# Patient Record
Sex: Female | Born: 1945 | Race: White | Hispanic: No | Marital: Married | Smoking: Current every day smoker
Health system: Southern US, Community
[De-identification: ages and names within clinical notes are randomized; demographics above are authoritative.]

## PROBLEM LIST (undated history)

## (undated) DIAGNOSIS — F319 Bipolar disorder, unspecified: Secondary | ICD-10-CM

## (undated) DIAGNOSIS — J449 Chronic obstructive pulmonary disease, unspecified: Secondary | ICD-10-CM

## (undated) DIAGNOSIS — Z87442 Personal history of urinary calculi: Secondary | ICD-10-CM

## (undated) DIAGNOSIS — I10 Essential (primary) hypertension: Secondary | ICD-10-CM

## (undated) DIAGNOSIS — M1990 Unspecified osteoarthritis, unspecified site: Secondary | ICD-10-CM

## (undated) DIAGNOSIS — S82899A Other fracture of unspecified lower leg, initial encounter for closed fracture: Secondary | ICD-10-CM

## (undated) HISTORY — PX: BREAST LUMPECTOMY: SHX2

## (undated) HISTORY — PX: OTHER SURGICAL HISTORY: SHX169

## (undated) HISTORY — PX: RADICAL HYSTERECTOMY: SHX2283

## (undated) HISTORY — PX: CARPAL TUNNEL RELEASE: SHX101

## (undated) HISTORY — PX: CHOLECYSTECTOMY: SHX55

## (undated) HISTORY — PX: LAMINECTOMY: SHX219

---

## 2016-03-02 DIAGNOSIS — Z5900 Homelessness unspecified: Secondary | ICD-10-CM

## 2016-03-03 ENCOUNTER — Emergency Department (HOSPITAL_COMMUNITY)
Admission: EM | Admit: 2016-03-03 | Discharge: 2016-03-03 | Disposition: A | Discharge status: Home / Self Care | Payer: Commercial Managed Care - HMO | Attending: Emergency Medicine | Admitting: Emergency Medicine

## 2016-03-03 ENCOUNTER — Encounter (HOSPITAL_COMMUNITY): Payer: Self-pay | Admitting: *Deleted

## 2016-03-03 DIAGNOSIS — Y929 Unspecified place or not applicable: Secondary | ICD-10-CM | POA: Diagnosis not present

## 2016-03-03 DIAGNOSIS — T25211A Burn of second degree of right ankle, initial encounter: Secondary | ICD-10-CM | POA: Insufficient documentation

## 2016-03-03 DIAGNOSIS — T25222A Burn of second degree of left foot, initial encounter: Secondary | ICD-10-CM | POA: Insufficient documentation

## 2016-03-03 DIAGNOSIS — Y9302 Activity, running: Secondary | ICD-10-CM | POA: Insufficient documentation

## 2016-03-03 DIAGNOSIS — F17210 Nicotine dependence, cigarettes, uncomplicated: Secondary | ICD-10-CM | POA: Diagnosis not present

## 2016-03-03 DIAGNOSIS — X58XXXA Exposure to other specified factors, initial encounter: Secondary | ICD-10-CM | POA: Diagnosis not present

## 2016-03-03 DIAGNOSIS — F319 Bipolar disorder, unspecified: Secondary | ICD-10-CM | POA: Diagnosis not present

## 2016-03-03 DIAGNOSIS — S90829A Blister (nonthermal), unspecified foot, initial encounter: Secondary | ICD-10-CM

## 2016-03-03 DIAGNOSIS — Y999 Unspecified external cause status: Secondary | ICD-10-CM | POA: Diagnosis not present

## 2016-03-03 DIAGNOSIS — I10 Essential (primary) hypertension: Secondary | ICD-10-CM | POA: Diagnosis not present

## 2016-03-03 HISTORY — DX: Bipolar disorder, unspecified: F31.9

## 2016-03-03 HISTORY — DX: Essential (primary) hypertension: I10

## 2016-03-03 MED ORDER — ATORVASTATIN CALCIUM 20 MG PO TABS: 20.0000 mg | ORAL_TABLET | ORAL | Status: DC

## 2016-03-03 MED ORDER — MIRTAZAPINE 45 MG PO TABS: 45.0000 mg | ORAL_TABLET | Freq: Every day | ORAL | Status: DC

## 2016-03-03 MED ORDER — QUETIAPINE FUMARATE 200 MG PO TABS: 200.0000 mg | ORAL_TABLET | Freq: Two times a day (BID) | ORAL | Status: DC

## 2016-03-03 MED ORDER — BACITRACIN ZINC 500 UNIT/GM EX OINT
TOPICAL_OINTMENT | Freq: Two times a day (BID) | CUTANEOUS | Status: DC
  Administered 2016-03-03: 3 via TOPICAL
  Filled 2016-03-03: qty 2.7

## 2016-03-03 MED ORDER — DIAZEPAM 2 MG PO TABS: 2.0000 mg | ORAL_TABLET | Freq: Four times a day (QID) | ORAL | Status: DC | PRN

## 2016-03-03 MED ORDER — DIVALPROEX SODIUM 250 MG PO DR TAB: 250.0000 mg | DELAYED_RELEASE_TABLET | Freq: Every day | ORAL | Status: DC

## 2016-03-03 MED ORDER — GABAPENTIN 100 MG PO CAPS: 100.0000 mg | ORAL_CAPSULE | Freq: Three times a day (TID) | ORAL | Status: DC

## 2016-03-03 NOTE — Discharge Instructions (Signed)
Go to [facility], on Monday to get your prescriptions filled and get psychiatric care.  Clean the wounds on your feet with soap and water daily, and applying a light bandage, until they are healed.

## 2016-03-03 NOTE — ED Notes (Signed)
Patient's feet cleaned with Safclens, bacitracin applied.  Both feet wrapped with gauze.

## 2016-03-03 NOTE — Progress Notes (Addendum)
[facility].  [facility] consulted for medication needs.  EDCM unable to assist patient with cost of medications due to patient has insurance.  [facility] consulted EDSW for homeless resources.  EDCM went to speak to patient at bedside, however, registration at bedside.
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
## 2016-03-03 NOTE — ED Notes (Signed)
Bed: WA04 Expected date:  Expected time:  Means of arrival:  Comments: EMS- blisters to feet
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
## 2016-03-06 ENCOUNTER — Telehealth: Payer: Self-pay

## 2016-03-06 NOTE — Telephone Encounter (Signed)
Message received from Livia Snellen, RN CM requesting assistance with filling her medication and a hospital follow up appointment. At this time, there are no hospital follow up appointments available.   This CM spoke to Kerby Moors, [facility] Pharmacist. She noted that [facility] pharmacy does not have the valium ;but the other medications requested - seroquel, neurontin, lipitor and remeron could be filled once but they would have to be put on an account and the patient would be responsible for what the insurance doesn't cover.    Call placed to [facility] # [phone] and spoke to Bethann Humble who stated that the resident was not available. A message was left requesting the resident call this CM at [phone] or [phone].   Update provided to A. Christen Bame, RN CM.

## 2016-03-07 ENCOUNTER — Telehealth: Payer: Self-pay

## 2016-03-07 DIAGNOSIS — Z5900 Homelessness unspecified: Secondary | ICD-10-CM

## 2016-03-07 NOTE — Telephone Encounter (Signed)
Message received from the patient returning CM call.  Call placed to the [facility] # [phone]. Spoke to [place] who stated that the resident is not on the premises.  Message left requesting a call back to # [phone].

## 2016-03-08 ENCOUNTER — Telehealth: Payer: Self-pay

## 2016-03-08 DIAGNOSIS — Z5900 Homelessness unspecified: Secondary | ICD-10-CM

## 2016-03-08 DIAGNOSIS — T7411XA Adult physical abuse, confirmed, initial encounter: Secondary | ICD-10-CM

## 2016-03-08 DIAGNOSIS — I10 Essential (primary) hypertension: Secondary | ICD-10-CM
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
## 2016-03-08 NOTE — Telephone Encounter (Signed)
Message received and then a call received from the patient. She is still at the [facility]. She stated that she had all of her medications filled at [facility] on Monday, 11/07/15 and the cost was $61. She noted that she does not have a follow up appointment scheduled at [facility] yet. Explained to her the importance of being seen by a doctor for a hospital follow up appointment and to establish care with a PCP. Also explained that when this CM first reached out to her there were appointments available at the [facility]; but there are not any appointments available at this time. Informed her that she can call the [facility] at any time to check for cancellations. Also informed her that she can contact the [facility]) as there is a NP on site. She stated that she is aware of the [facility] but they are closed 03/07/16 adn 03/08/16.  Also informed her that she can call [employer] to inquire about a PCP in [place] as she said that she would like to stay here. She got her [employer] card when this CM was on the phone but she could not read the information/#s on the back. Instructed to ask someone to assist her with reading the information as she did not have her glasses. She can then call the customer service # and inquire about PCPs in this area.  She stated that she would follow up and was appreciative of the call.   Update provided to Livia Snellen, RN CM
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
## 2016-03-09 NOTE — Congregational Nurse Program (Signed)
Congregational Nurse Program Note  Date of Encounter: 03/02/2016  Past Medical History: Past Medical History  Diagnosis Date  . Hypertension   . Bipolar disorder [facility])     Encounter Details:     CNP Questionnaire - 03/08/16 1445    Patient Demographics   Is this a new or existing patient? New   Patient is considered a/an Not Applicable   Race Caucasian/White   Patient Assistance   Location of Patient Assistance Not Applicable   Patient's financial/insurance status Private Insurance Coverage   Uninsured Patient No   Patient referred to apply for the following financial assistance Not Applicable   Food insecurities addressed Referred to food bank or resource   Transportation assistance Yes   Type of Assistance Bus Pass Given   Assistance securing medications No   Educational health offerings [place] health;Interpersonal relationships;Spiritual care;Medications   Encounter Details   Primary purpose of visit Safety;Acute Illness/Condition Visit   Was an Emergency Department visit averted? Yes   Does patient have a medical provider? No   Patient referred to [place]   Was a mental health screening completed? (GAINS tool) No   Does patient have dental issues? No   Does patient have vision issues? No   Does your patient have an abnormal blood pressure today? No   Since previous encounter, have you referred patient for abnormal blood pressure that resulted in a new diagnosis or medication change? No   Does your patient have an abnormal blood glucose today? No   Since previous encounter, have you referred patient for abnormal blood glucose that resulted in a new diagnosis or medication change? No   Was there a life-saving intervention made? No     client reports she is bi-polar and is out of her medication.  States has not been able to sleep and very concerned about her mental status without her medications.  Referred her to [facility] for f/u and medication  management.

## 2016-03-20 DIAGNOSIS — Z5900 Homelessness unspecified: Secondary | ICD-10-CM

## 2016-03-20 DIAGNOSIS — T7411XA Adult physical abuse, confirmed, initial encounter: Secondary | ICD-10-CM

## 2016-03-20 DIAGNOSIS — I10 Essential (primary) hypertension: Secondary | ICD-10-CM
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
## 2016-03-23 DIAGNOSIS — Z5900 Homelessness unspecified: Secondary | ICD-10-CM

## 2016-03-23 DIAGNOSIS — I10 Essential (primary) hypertension: Secondary | ICD-10-CM

## 2016-03-23 DIAGNOSIS — F1990 Other psychoactive substance use, unspecified, uncomplicated: Secondary | ICD-10-CM

## 2016-03-23 DIAGNOSIS — T7411XA Adult physical abuse, confirmed, initial encounter: Secondary | ICD-10-CM

## 2016-03-23 NOTE — Congregational Nurse Program (Signed)
Client returned to [facility] to do laundry and to take a taxi to [facility] with a voucher from [employer].  Client is hesitant at times to move forward with going to the center, encouragement and support given. Client was able to receive medications prescribed by Marliss Coots, FNP.  Client went in taxi to [facility] center and CN met her there as a companion.  Client filled out entry information for [facility].  CN left client in a safe environment at [facility] with staff and filled out another taxi voucher from [place] for her to go back to [employer].

## 2016-06-20 ENCOUNTER — Encounter: Payer: Self-pay | Admitting: Pediatric Intensive Care

## 2016-07-19 NOTE — Congregational Nurse Program (Signed)
Congregational Nurse Program Note  Date of Encounter: 06/20/2016  Past Medical History: Past Medical History:  Diagnosis Date  . Bipolar disorder ([place])   . Hypertension     Encounter Details:     CNP Questionnaire - 06/20/16 0900      Patient Demographics   Is this a new or existing patient? New   Patient is considered a/an Not Applicable   Race Caucasian/White     Patient Assistance   Location of Patient Assistance [facility]   Patient's financial/insurance status Medicare;Medicaid   Uninsured Patient ([facility] Oncologist) No   Patient referred to apply for the following financial assistance Not Applicable   Food insecurities addressed Not Applicable   Transportation assistance No   Assistance securing medications No   Educational Programmer, multimedia the healthcare system;Chronic disease     Encounter Details   Primary purpose of visit Chronic Illness/Condition Visit   Was an Emergency Department visit averted? Not Applicable   Does patient have a medical provider? No   Patient referred to Establish PCP   Was a mental health screening completed? (GAINS tool) No   Does patient have vision issues? No   Does your patient have an abnormal blood pressure today? No   Since previous encounter, have you referred patient for abnormal blood pressure that resulted in a new diagnosis or medication change? No   Does your patient have an abnormal blood glucose today? No   Since previous encounter, have you referred patient for abnormal blood glucose that resulted in a new diagnosis or medication change? No   Was there a life-saving intervention made? No     New client visit- client states she has had chronic diarrhea for last two years. Has medications at present. No PCP in this area. Encouraged to establish PCP locally and CN will assist. Client states that she does not want a new doctor. Client to follow up with CN clinic while guest at shelter.

## 2016-09-04 ENCOUNTER — Encounter (HOSPITAL_COMMUNITY): Payer: Self-pay

## 2016-09-04 ENCOUNTER — Emergency Department (HOSPITAL_COMMUNITY)
Admission: EM | Admit: 2016-09-04 | Discharge: 2016-09-04 | Disposition: A | Discharge status: Home / Self Care | Payer: Commercial Managed Care - HMO | Attending: Emergency Medicine | Admitting: Emergency Medicine

## 2016-09-04 DIAGNOSIS — Z79899 Other long term (current) drug therapy: Secondary | ICD-10-CM | POA: Insufficient documentation

## 2016-09-04 DIAGNOSIS — R251 Tremor, unspecified: Secondary | ICD-10-CM | POA: Diagnosis not present

## 2016-09-04 DIAGNOSIS — F17210 Nicotine dependence, cigarettes, uncomplicated: Secondary | ICD-10-CM | POA: Diagnosis not present

## 2016-09-04 DIAGNOSIS — Z760 Encounter for issue of repeat prescription: Secondary | ICD-10-CM | POA: Insufficient documentation

## 2016-09-04 DIAGNOSIS — I10 Essential (primary) hypertension: Secondary | ICD-10-CM | POA: Insufficient documentation

## 2016-09-04 DIAGNOSIS — R197 Diarrhea, unspecified: Secondary | ICD-10-CM | POA: Diagnosis not present

## 2016-09-04 LAB — COMPREHENSIVE METABOLIC PANEL
ALBUMIN: 3.7 g/dL (ref 3.5–5.0)
ALT: 14 U/L (ref 14–54)
ANION GAP: 6 (ref 5–15)
AST: 18 U/L (ref 15–41)
Alkaline Phosphatase: 69 U/L (ref 38–126)
BUN: 19 mg/dL (ref 6–20)
CALCIUM: 9.3 mg/dL (ref 8.9–10.3)
CHLORIDE: 106 mmol/L (ref 101–111)
CO2: 27 mmol/L (ref 22–32)
Creatinine, Ser: 0.91 mg/dL (ref 0.44–1.00)
GFR calc non Af Amer: >60 mL/min (ref >60)
Glucose, Bld: 86 mg/dL (ref 65–99)
POTASSIUM: 3.6 mmol/L (ref 3.5–5.1)
SODIUM: 139 mmol/L (ref 135–145)
Total Bilirubin: 0.4 mg/dL (ref 0.3–1.2)
Total Protein: 7 g/dL (ref 6.5–8.1)

## 2016-09-04 LAB — CBC WITH DIFFERENTIAL/PLATELET
BASOS PCT: 1 %
Basophils Absolute: 0 10*3/uL (ref 0.0–0.1)
Eosinophils Absolute: 0.1 10*3/uL (ref 0.0–0.7)
Eosinophils Relative: 1 %
HEMATOCRIT: 44 % (ref 36.0–46.0)
HEMOGLOBIN: 14.3 g/dL (ref 12.0–15.0)
LYMPHS ABS: 2.8 10*3/uL (ref 0.7–4.0)
LYMPHS PCT: 35 %
MCH: 27.7 pg (ref 26.0–34.0)
MCHC: 32.5 g/dL (ref 30.0–36.0)
MCV: 85.1 fL (ref 78.0–100.0)
MONOS PCT: 6 %
Monocytes Absolute: 0.5 10*3/uL (ref 0.1–1.0)
NEUTROS ABS: 4.5 10*3/uL (ref 1.7–7.7)
NEUTROS PCT: 57 %
Platelets: 260 10*3/uL (ref 150–400)
RBC: 5.17 MIL/uL — ABNORMAL HIGH (ref 3.87–5.11)
RDW: 15.1 % (ref 11.5–15.5)
WBC: 7.9 10*3/uL (ref 4.0–10.5)

## 2016-09-04 MED ORDER — QUETIAPINE FUMARATE 200 MG PO TABS
200.0000 mg | ORAL_TABLET | Freq: Every day | ORAL | Status: DC
  Administered 2016-09-04: 200 mg via ORAL
  Filled 2016-09-04: qty 1

## 2016-09-04 MED ORDER — ATORVASTATIN CALCIUM 20 MG PO TABS: 20.0000 mg | ORAL_TABLET | ORAL | 0 refills | Status: DC

## 2016-09-04 MED ORDER — DIVALPROEX SODIUM 250 MG PO DR TAB: 250.0000 mg | DELAYED_RELEASE_TABLET | Freq: Every day | ORAL | 0 refills | Status: DC

## 2016-09-04 MED ORDER — MIRTAZAPINE 45 MG PO TABS: 45.0000 mg | ORAL_TABLET | Freq: Every day | ORAL | 0 refills | Status: DC

## 2016-09-04 MED ORDER — LISINOPRIL 10 MG PO TABS
10.0000 mg | ORAL_TABLET | Freq: Once | ORAL | Status: AC
  Administered 2016-09-04: 10 mg via ORAL
  Filled 2016-09-04: qty 1

## 2016-09-04 MED ORDER — GABAPENTIN 100 MG PO CAPS: 100.0000 mg | ORAL_CAPSULE | Freq: Three times a day (TID) | ORAL | 0 refills | Status: DC

## 2016-09-04 MED ORDER — DIAZEPAM 2 MG PO TABS: 2.0000 mg | ORAL_TABLET | Freq: Four times a day (QID) | ORAL | 0 refills | Status: DC | PRN

## 2016-09-04 MED ORDER — QUETIAPINE FUMARATE 200 MG PO TABS: 200.0000 mg | ORAL_TABLET | Freq: Two times a day (BID) | ORAL | 0 refills | Status: AC

## 2016-09-04 NOTE — ED Provider Notes (Signed)
[facility] DEPT Provider Note   CSN: [id number] Arrival date & time: 09/04/16  1232     History   Chief Complaint Chief Complaint  Patient presents with  . Medication Refill    HPI Maria [name] is a 70 y.o. female.  HPI  70 year old female with history of bipolar disorder, hypertension presenting to the requesting for medication refill. Patient reports she ran out of all for psychiatric and hypertensive medication for at least a month because she does not have a primary care provider. She is here at the urging of her husband to obtain a medication refill. She does complain of generalized body tremors, decreased appetite for the past 2 or 3 days which she attributed to lack of medication. She also complaining of having intermittent diarrhea at least 4 times a day for the past 2-3 weeks. She denies any associated fever, chills, lightheadedness, dizziness, headache, chest pain, shortness of breath, abdominal pain, vomiting, or rash. She denies any homicidal or suicidal ideation. Denies auditory or visual hallucination. Denies alcohol abuse or recreational drug use.  Past Medical History:  Diagnosis Date  . Bipolar disorder ([place])   . Hypertension     There are no active problems to display for this patient.   Past Surgical History:  Procedure Laterality Date  . bladder tack    . BREAST LUMPECTOMY    . CARPAL TUNNEL RELEASE Bilateral   . CHOLECYSTECTOMY    . LAMINECTOMY     neck  . RADICAL HYSTERECTOMY    . spinal fusion      OB History    No data available       Home Medications    Prior to Admission medications   Medication Sig Start Date End Date Taking? Authorizing Provider  atorvastatin (LIPITOR) 20 MG tablet Take 1 tablet (20 mg total) by mouth as directed. 03/03/16   Daleen Bo, MD  diazepam (VALIUM) 2 MG tablet Take 1 tablet (2 mg total) by mouth every 6 (six) hours as needed for anxiety. 03/03/16   Daleen Bo, MD  divalproex (DEPAKOTE) 250 MG DR  tablet Take 1 tablet (250 mg total) by mouth daily. 03/03/16   Daleen Bo, MD  gabapentin (NEURONTIN) 100 MG capsule Take 1 capsule (100 mg total) by mouth 3 (three) times daily. 03/03/16   Daleen Bo, MD  mirtazapine (REMERON) 45 MG tablet Take 1 tablet (45 mg total) by mouth at bedtime. 03/03/16   Daleen Bo, MD  QUEtiapine (SEROQUEL) 200 MG tablet Take 1 tablet (200 mg total) by mouth 2 (two) times daily. 03/03/16   Daleen Bo, MD    Family History No family history on file.  Social History Social History  Substance Use Topics  . Smoking status: Current Every Day Smoker    Packs/day: 0.50    Types: Cigarettes  . Smokeless tobacco: Never Used  . Alcohol use No     Allergies   Penicillins   Review of Systems Review of Systems  All other systems reviewed and are negative.    Physical Exam Updated Vital Signs BP 191/89 (BP Location: Left Arm)   Pulse 91   Temp 98.5 F (36.9 C) (Oral)   Resp 18   Ht 5\' 7"  (1.702 m)   Wt 68 kg   SpO2 99%   BMI 23.49 kg/m   Physical Exam  Constitutional: She is oriented to person, place, and time. She appears well-developed and well-nourished. No distress.  Elderly female sitting in bed in no acute discomfort, nontoxic  in appearance  HENT:  Head: Atraumatic.  Mouth/Throat: Oropharynx is clear and moist.  Eyes: Conjunctivae are normal.  Neck: Neck supple.  Cardiovascular: Normal rate, regular rhythm and intact distal pulses.   Pulmonary/Chest: Effort normal and breath sounds normal.  Abdominal: Soft. Bowel sounds are normal. She exhibits no distension. There is no tenderness.  Musculoskeletal: She exhibits no edema.  Neurological: She is alert and oriented to person, place, and time.  Intentional tremors to both hands  Skin: No rash noted.  Psychiatric: Her speech is normal and behavior is normal. Her mood appears anxious. Thought content is not paranoid. She expresses no homicidal and no suicidal ideation.  Nursing note  and vitals reviewed.    ED Treatments / Results  Labs (all labs ordered are listed, but only abnormal results are displayed) Labs Reviewed  CBC WITH DIFFERENTIAL/PLATELET - Abnormal; Notable for the following:       Result Value   RBC 5.17 (*)    All other components within normal limits  COMPREHENSIVE METABOLIC PANEL    EKG  EKG Interpretation None       Radiology No results found.  Procedures Procedures (including critical care time)  Medications Ordered in ED Medications  QUEtiapine (SEROQUEL) tablet 200 mg (200 mg Oral Given 09/04/16 1324)  lisinopril (PRINIVIL,ZESTRIL) tablet 10 mg (10 mg Oral Given 09/04/16 1319)     Initial Impression / Assessment and Plan / ED Course  I have reviewed the triage vital signs and the nursing notes.  Pertinent labs & imaging results that were available during my care of the patient were reviewed by me and considered in my medical decision making (see chart for details).  Clinical Course     BP 110/66 (BP Location: Right Arm)   Pulse 89   Temp 98.5 F (36.9 C) (Oral)   Resp 16   Ht 5\' 7"  (1.702 m)   Wt 68 kg   SpO2 99%   BMI 23.49 kg/m    Final Clinical Impressions(s) / ED Diagnoses   Final diagnoses:  Medication refill  Diarrhea, unspecified type  Occasional tremors    New Prescriptions Current Discharge Medication List     1:14 PM Patient here requesting for medication refill mostly of the psychiatric medication as well as her blood pressure medication. She has an elevated blood pressure of 191/89 this is likely a reflection of a symptomatic hypertensive urgency. She does complaining of having tremors in the hands as well as having recurrent diarrhea. I would like to check her labs to ensure no electrolyte derangement causing her tremors. Will provide a dose of her normal medication while she is in the ER.  Care discussed with Dr. Winfred Leeds  2:35 PM Labs are reassuring.  No electrolytes derangement.  I have  reached out to our case manager, Mariann Laster who will help coordinate outpt care such as scheduling PCP for f/u .    2:50 PM Case Manager have reviewed pt's chart.  She recommend prescribe 5 days worth of pt's mediations.  Pt can go to [facility] for refill.  She can also f/u at [facility] in 5 days for further care.  Pt made aware of plan.  Stable for discharge.  BP stable.     Domenic Moras, PA-C 09/04/16 1455    Orlie Dakin, MD 09/04/16 1610

## 2016-09-04 NOTE — ED Triage Notes (Signed)
Per Pt, Pt is coming from home. Pt has been off of her bipolar medication for one month. Pt reports tremors and is noted to have high blood pressure.

## 2016-09-04 NOTE — Discharge Instructions (Signed)
Please follow up with [facility] pharmacy to get your medication refill.  Follow up with [facility] walk in clinic on Thursday for further management of your health.

## 2016-09-04 NOTE — ED Notes (Signed)
Cup of coffee given to visitor with cream and sugar

## 2016-09-04 NOTE — ED Notes (Signed)
Patient undressed and in gown; warm blanket given; visitor at bedside

## 2016-09-04 NOTE — ED Provider Notes (Signed)
Patient requesting refills of all of her medications and referral to a PMD and as she is been living [facility] for the past 7 months and does not have a primary care physician. She is out of all of her medications except for Depakote. She feels calm since receiving a single dose of Seroquel while here. Patient is alert and in no distress, Glasgow Coma Score 15   Maria Dakin, MD 09/04/16 [phone]

## 2016-09-18 ENCOUNTER — Encounter (HOSPITAL_COMMUNITY): Payer: Self-pay

## 2016-09-18 ENCOUNTER — Emergency Department (HOSPITAL_COMMUNITY)
Admission: EM | Admit: 2016-09-18 | Discharge: 2016-09-18 | Disposition: A | Discharge status: Home / Self Care | Payer: Medicare HMO | Attending: Dermatology | Admitting: Dermatology

## 2016-09-18 DIAGNOSIS — Z760 Encounter for issue of repeat prescription: Secondary | ICD-10-CM | POA: Diagnosis not present

## 2016-09-18 DIAGNOSIS — F17210 Nicotine dependence, cigarettes, uncomplicated: Secondary | ICD-10-CM | POA: Diagnosis not present

## 2016-09-18 DIAGNOSIS — I10 Essential (primary) hypertension: Secondary | ICD-10-CM | POA: Diagnosis not present

## 2016-09-18 DIAGNOSIS — Z5321 Procedure and treatment not carried out due to patient leaving prior to being seen by health care provider: Secondary | ICD-10-CM | POA: Insufficient documentation

## 2016-09-18 NOTE — ED Notes (Signed)
Unable to locate pt for repeat vitals or to take to exam room

## 2016-09-18 NOTE — ED Triage Notes (Addendum)
Pt requesting a months supply of her home medications: Lipitor, seroquel, zoloft, depakote and "I cant remember all of them." Pt has bipolar disorder. She last took them 4 days ago. Denies SI/HI. pts son reports he has been giving her his prescribed medications. Pt also requesting psychiatric evaluation.

## 2016-09-20 DIAGNOSIS — F319 Bipolar disorder, unspecified: Secondary | ICD-10-CM | POA: Diagnosis not present

## 2016-09-21 DIAGNOSIS — F3132 Bipolar disorder, current episode depressed, moderate: Secondary | ICD-10-CM | POA: Diagnosis not present

## 2016-10-23 DIAGNOSIS — F3132 Bipolar disorder, current episode depressed, moderate: Secondary | ICD-10-CM | POA: Diagnosis not present

## 2016-12-25 ENCOUNTER — Telehealth: Payer: Self-pay

## 2016-12-25 DIAGNOSIS — F3132 Bipolar disorder, current episode depressed, moderate: Secondary | ICD-10-CM | POA: Diagnosis not present

## 2016-12-25 NOTE — Telephone Encounter (Signed)
I left a message asking Mr. Maria [name] or patient to call and confirm patient's PCP. [name] ([place])

## 2016-12-27 NOTE — Telephone Encounter (Signed)
I left another message with Mr. Maria [name] asking the patient to call me. [name] ([place])

## 2016-12-29 NOTE — Telephone Encounter (Signed)
3rd attempt to reach patient and confirm PCP. Maria [name] ([place])

## 2017-02-28 DIAGNOSIS — R319 Hematuria, unspecified: Secondary | ICD-10-CM | POA: Diagnosis not present

## 2017-02-28 DIAGNOSIS — Z8659 Personal history of other mental and behavioral disorders: Secondary | ICD-10-CM | POA: Diagnosis not present

## 2017-02-28 DIAGNOSIS — N390 Urinary tract infection, site not specified: Secondary | ICD-10-CM | POA: Diagnosis not present

## 2017-02-28 DIAGNOSIS — H539 Unspecified visual disturbance: Secondary | ICD-10-CM | POA: Diagnosis not present

## 2017-02-28 DIAGNOSIS — Z720 Tobacco use: Secondary | ICD-10-CM | POA: Diagnosis not present

## 2017-02-28 DIAGNOSIS — F319 Bipolar disorder, unspecified: Secondary | ICD-10-CM | POA: Diagnosis not present

## 2017-02-28 DIAGNOSIS — Z131 Encounter for screening for diabetes mellitus: Secondary | ICD-10-CM | POA: Diagnosis not present

## 2017-02-28 DIAGNOSIS — Z0000 Encounter for general adult medical examination without abnormal findings: Secondary | ICD-10-CM | POA: Diagnosis not present

## 2017-02-28 DIAGNOSIS — F419 Anxiety disorder, unspecified: Secondary | ICD-10-CM | POA: Diagnosis not present

## 2017-02-28 DIAGNOSIS — Z5181 Encounter for therapeutic drug level monitoring: Secondary | ICD-10-CM | POA: Diagnosis not present

## 2017-02-28 DIAGNOSIS — Z0110 Encounter for examination of ears and hearing without abnormal findings: Secondary | ICD-10-CM | POA: Diagnosis not present

## 2017-04-16 ENCOUNTER — Telehealth: Payer: Self-pay

## 2017-04-16 NOTE — Telephone Encounter (Signed)
NO TELEPHONE # HOMELESS.... No PCP is listed for this patient.  Tried to reach out to patient regarding her PCP.  No contacts listed.  No address listed.  No telephone # listed....[email or web address]

## 2017-05-15 DIAGNOSIS — N390 Urinary tract infection, site not specified: Secondary | ICD-10-CM | POA: Diagnosis not present

## 2017-05-15 DIAGNOSIS — F419 Anxiety disorder, unspecified: Secondary | ICD-10-CM | POA: Diagnosis not present

## 2017-05-15 DIAGNOSIS — F3132 Bipolar disorder, current episode depressed, moderate: Secondary | ICD-10-CM | POA: Diagnosis not present

## 2017-05-15 DIAGNOSIS — Z720 Tobacco use: Secondary | ICD-10-CM | POA: Diagnosis not present

## 2017-05-15 DIAGNOSIS — F200 Paranoid schizophrenia: Secondary | ICD-10-CM | POA: Diagnosis not present

## 2017-05-15 DIAGNOSIS — R319 Hematuria, unspecified: Secondary | ICD-10-CM | POA: Diagnosis not present

## 2017-05-15 DIAGNOSIS — E785 Hyperlipidemia, unspecified: Secondary | ICD-10-CM | POA: Diagnosis not present

## 2017-05-15 DIAGNOSIS — F319 Bipolar disorder, unspecified: Secondary | ICD-10-CM | POA: Diagnosis not present

## 2017-05-15 DIAGNOSIS — Z8659 Personal history of other mental and behavioral disorders: Secondary | ICD-10-CM | POA: Diagnosis not present

## 2017-08-16 DIAGNOSIS — F439 Reaction to severe stress, unspecified: Secondary | ICD-10-CM | POA: Diagnosis not present

## 2017-08-31 ENCOUNTER — Encounter: Payer: Self-pay | Admitting: Pediatric Intensive Care

## 2017-08-31 ENCOUNTER — Ambulatory Visit: Payer: Self-pay | Admitting: Family Medicine
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
## 2017-09-21 DIAGNOSIS — F3132 Bipolar disorder, current episode depressed, moderate: Secondary | ICD-10-CM | POA: Diagnosis not present

## 2017-11-09 DIAGNOSIS — D249 Benign neoplasm of unspecified breast: Secondary | ICD-10-CM | POA: Diagnosis not present

## 2017-11-09 DIAGNOSIS — M5136 Other intervertebral disc degeneration, lumbar region: Secondary | ICD-10-CM | POA: Diagnosis not present

## 2017-11-09 DIAGNOSIS — I10 Essential (primary) hypertension: Secondary | ICD-10-CM | POA: Diagnosis not present

## 2017-11-09 DIAGNOSIS — M169 Osteoarthritis of hip, unspecified: Secondary | ICD-10-CM | POA: Diagnosis not present

## 2017-11-09 DIAGNOSIS — F319 Bipolar disorder, unspecified: Secondary | ICD-10-CM | POA: Diagnosis not present

## 2017-11-09 DIAGNOSIS — M439 Deforming dorsopathy, unspecified: Secondary | ICD-10-CM | POA: Diagnosis not present

## 2017-11-09 DIAGNOSIS — F419 Anxiety disorder, unspecified: Secondary | ICD-10-CM | POA: Diagnosis not present

## 2017-11-09 DIAGNOSIS — M19049 Primary osteoarthritis, unspecified hand: Secondary | ICD-10-CM | POA: Diagnosis not present

## 2017-11-09 DIAGNOSIS — G629 Polyneuropathy, unspecified: Secondary | ICD-10-CM | POA: Diagnosis not present

## 2017-12-12 DIAGNOSIS — M5136 Other intervertebral disc degeneration, lumbar region: Secondary | ICD-10-CM | POA: Diagnosis not present

## 2017-12-12 DIAGNOSIS — M439 Deforming dorsopathy, unspecified: Secondary | ICD-10-CM | POA: Diagnosis not present

## 2017-12-12 DIAGNOSIS — D249 Benign neoplasm of unspecified breast: Secondary | ICD-10-CM | POA: Diagnosis not present

## 2017-12-12 DIAGNOSIS — M169 Osteoarthritis of hip, unspecified: Secondary | ICD-10-CM | POA: Diagnosis not present

## 2017-12-12 DIAGNOSIS — F419 Anxiety disorder, unspecified: Secondary | ICD-10-CM | POA: Diagnosis not present

## 2017-12-12 DIAGNOSIS — I10 Essential (primary) hypertension: Secondary | ICD-10-CM | POA: Diagnosis not present

## 2017-12-12 DIAGNOSIS — F319 Bipolar disorder, unspecified: Secondary | ICD-10-CM | POA: Diagnosis not present

## 2017-12-12 DIAGNOSIS — M19049 Primary osteoarthritis, unspecified hand: Secondary | ICD-10-CM | POA: Diagnosis not present

## 2017-12-12 DIAGNOSIS — G629 Polyneuropathy, unspecified: Secondary | ICD-10-CM | POA: Diagnosis not present

## 2017-12-18 ENCOUNTER — Emergency Department (HOSPITAL_COMMUNITY)
Admission: EM | Admit: 2017-12-18 | Discharge: 2017-12-18 | Disposition: A | Discharge status: Home / Self Care | Payer: Medicare HMO | Attending: Emergency Medicine | Admitting: Emergency Medicine

## 2017-12-18 ENCOUNTER — Emergency Department (HOSPITAL_COMMUNITY): Payer: Medicare HMO

## 2017-12-18 ENCOUNTER — Encounter (HOSPITAL_COMMUNITY): Payer: Self-pay

## 2017-12-18 DIAGNOSIS — T148XXA Other injury of unspecified body region, initial encounter: Secondary | ICD-10-CM | POA: Diagnosis not present

## 2017-12-18 DIAGNOSIS — S82401A Unspecified fracture of shaft of right fibula, initial encounter for closed fracture: Secondary | ICD-10-CM | POA: Insufficient documentation

## 2017-12-18 DIAGNOSIS — Z79899 Other long term (current) drug therapy: Secondary | ICD-10-CM | POA: Diagnosis not present

## 2017-12-18 DIAGNOSIS — S8261XA Displaced fracture of lateral malleolus of right fibula, initial encounter for closed fracture: Secondary | ICD-10-CM | POA: Diagnosis not present

## 2017-12-18 DIAGNOSIS — I10 Essential (primary) hypertension: Secondary | ICD-10-CM | POA: Insufficient documentation

## 2017-12-18 DIAGNOSIS — S82201A Unspecified fracture of shaft of right tibia, initial encounter for closed fracture: Secondary | ICD-10-CM

## 2017-12-18 DIAGNOSIS — Y999 Unspecified external cause status: Secondary | ICD-10-CM | POA: Diagnosis not present

## 2017-12-18 DIAGNOSIS — S299XXA Unspecified injury of thorax, initial encounter: Secondary | ICD-10-CM | POA: Diagnosis not present

## 2017-12-18 DIAGNOSIS — W1830XA Fall on same level, unspecified, initial encounter: Secondary | ICD-10-CM | POA: Insufficient documentation

## 2017-12-18 DIAGNOSIS — N179 Acute kidney failure, unspecified: Secondary | ICD-10-CM | POA: Diagnosis not present

## 2017-12-18 DIAGNOSIS — M25579 Pain in unspecified ankle and joints of unspecified foot: Secondary | ICD-10-CM | POA: Diagnosis not present

## 2017-12-18 DIAGNOSIS — M25571 Pain in right ankle and joints of right foot: Secondary | ICD-10-CM | POA: Diagnosis not present

## 2017-12-18 DIAGNOSIS — E860 Dehydration: Secondary | ICD-10-CM | POA: Diagnosis not present

## 2017-12-18 DIAGNOSIS — S99911A Unspecified injury of right ankle, initial encounter: Secondary | ICD-10-CM | POA: Diagnosis present

## 2017-12-18 DIAGNOSIS — Y939 Activity, unspecified: Secondary | ICD-10-CM | POA: Diagnosis not present

## 2017-12-18 DIAGNOSIS — Y929 Unspecified place or not applicable: Secondary | ICD-10-CM | POA: Insufficient documentation

## 2017-12-18 DIAGNOSIS — R42 Dizziness and giddiness: Secondary | ICD-10-CM | POA: Diagnosis not present

## 2017-12-18 DIAGNOSIS — M25561 Pain in right knee: Secondary | ICD-10-CM | POA: Diagnosis not present

## 2017-12-18 DIAGNOSIS — R55 Syncope and collapse: Secondary | ICD-10-CM | POA: Diagnosis not present

## 2017-12-18 DIAGNOSIS — S0990XA Unspecified injury of head, initial encounter: Secondary | ICD-10-CM | POA: Diagnosis not present

## 2017-12-18 DIAGNOSIS — S82251A Displaced comminuted fracture of shaft of right tibia, initial encounter for closed fracture: Secondary | ICD-10-CM | POA: Diagnosis not present

## 2017-12-18 DIAGNOSIS — F17210 Nicotine dependence, cigarettes, uncomplicated: Secondary | ICD-10-CM | POA: Insufficient documentation

## 2017-12-18 DIAGNOSIS — S82451A Displaced comminuted fracture of shaft of right fibula, initial encounter for closed fracture: Secondary | ICD-10-CM | POA: Diagnosis not present

## 2017-12-18 LAB — COMPREHENSIVE METABOLIC PANEL
ALBUMIN: 3.4 g/dL — AB (ref 3.5–5.0)
ALK PHOS: 135 U/L — AB (ref 38–126)
ALT: 67 U/L — AB (ref 14–54)
AST: 32 U/L (ref 15–41)
Anion gap: 10 (ref 5–15)
BUN: 31 mg/dL — ABNORMAL HIGH (ref 6–20)
CALCIUM: 8.4 mg/dL — AB (ref 8.9–10.3)
CHLORIDE: 105 mmol/L (ref 101–111)
CO2: 22 mmol/L (ref 22–32)
CREATININE: 1.75 mg/dL — AB (ref 0.44–1.00)
GFR calc Af Amer: 33 mL/min — ABNORMAL LOW (ref >60)
GFR calc non Af Amer: 28 mL/min — ABNORMAL LOW (ref >60)
GLUCOSE: 107 mg/dL — AB (ref 65–99)
Potassium: 4.3 mmol/L (ref 3.5–5.1)
SODIUM: 137 mmol/L (ref 135–145)
Total Bilirubin: 0.5 mg/dL (ref 0.3–1.2)
Total Protein: 6.5 g/dL (ref 6.5–8.1)

## 2017-12-18 LAB — I-STAT CG4 LACTIC ACID, ED: Lactic Acid, Venous: 1.51 mmol/L (ref 0.5–1.9)

## 2017-12-18 LAB — CBC
HCT: 39.2 % (ref 36.0–46.0)
HEMOGLOBIN: 12.4 g/dL (ref 12.0–15.0)
MCH: 26.6 pg (ref 26.0–34.0)
MCHC: 31.6 g/dL (ref 30.0–36.0)
MCV: 84.1 fL (ref 78.0–100.0)
PLATELETS: 264 10*3/uL (ref 150–400)
RBC: 4.66 MIL/uL (ref 3.87–5.11)
RDW: 16.1 % — ABNORMAL HIGH (ref 11.5–15.5)
WBC: 13.6 10*3/uL — AB (ref 4.0–10.5)

## 2017-12-18 LAB — CK: Total CK: 316 U/L — ABNORMAL HIGH (ref 38–234)

## 2017-12-18 LAB — I-STAT TROPONIN, ED: Troponin i, poc: 0 ng/mL (ref 0.00–0.08)

## 2017-12-18 LAB — VALPROIC ACID LEVEL: Valproic Acid Lvl: <10 ug/mL — ABNORMAL LOW (ref 50.0–100.0)

## 2017-12-18 LAB — CBG MONITORING, ED: Glucose-Capillary: 89 mg/dL (ref 65–99)

## 2017-12-18 MED ORDER — MORPHINE SULFATE (PF) 4 MG/ML IV SOLN
4.0000 mg | Freq: Once | INTRAVENOUS | Status: AC
  Administered 2017-12-18: 4 mg via INTRAVENOUS
  Filled 2017-12-18: qty 1

## 2017-12-18 MED ORDER — FENTANYL CITRATE (PF) 100 MCG/2ML IJ SOLN
50.0000 ug | Freq: Once | INTRAMUSCULAR | Status: AC
  Administered 2017-12-18: 50 ug via INTRAVENOUS
  Filled 2017-12-18: qty 2

## 2017-12-18 MED ORDER — HYDROCODONE-ACETAMINOPHEN 5-325 MG PO TABS
1.0000 | ORAL_TABLET | Freq: Once | ORAL | Status: AC
  Administered 2017-12-18: 1 via ORAL
  Filled 2017-12-18: qty 1

## 2017-12-18 MED ORDER — HYDROCODONE-ACETAMINOPHEN 5-325 MG PO TABS: 1.0000 | ORAL_TABLET | Freq: Four times a day (QID) | ORAL | 0 refills | Status: DC | PRN

## 2017-12-18 MED ORDER — IBUPROFEN 200 MG PO TABS: 800.0000 mg | ORAL_TABLET | Freq: Three times a day (TID) | ORAL | 0 refills | Status: DC

## 2017-12-18 NOTE — ED Notes (Signed)
Pt has pure wick in place to obtain urine specimen.  

## 2017-12-18 NOTE — ED Notes (Signed)
Bed: WA25 Expected date:  Expected time:  Means of arrival:  Comments: EMS  

## 2017-12-18 NOTE — Discharge Instructions (Signed)
Take one ibuprofen 3 times a day with meals.  Do not take other anti-inflammatories at the same time open (Advil, Motrin, naproxen, Aleve).  You may supplement with norco if you need further pain control. Use ice packs as frequently as possible.  Keep your leg elevated.  Call Dr. Lorin Mercy' office tomorrow to set up an appointment.  Follow up with your primary care doctor to recheck your kidney function and to discuss your blood pressure.  Make sure you are drinking lots of water. Try to decrease your caffeine intake.  Follow up with the heart doctor for further evaluation of your heart.  Return to the ER if you develop numbness, persistent dizziness, cheat pain, difficulty breathing, or any new or concerning symptoms.

## 2017-12-18 NOTE — ED Provider Notes (Addendum)
[facility] DEPT Provider Note   CSN: [id number] Arrival date & time: 12/18/17  1559     History   Chief Complaint No chief complaint on file.   HPI Maria [name] is a 72 y.o. female presenting for evaluation of dizziness, fall, and right ankle injury.  Patient states that she is getting up from the bathroom, took a few steps, and her ankle gave out on her.  She fell and reports acute onset right ankle pain.  She was on the floor for 30 minutes before somebody helped her up.  She reports feeling dizzy prior to the fall, this occurs whenever she goes from sitting to standing over the past 3 days.  No dizziness currently or while at rest.  She thinks she might of blacked out when she fell, but is unsure.  Currently reporting only pain at the ankle, radiating up to the knee.  No pain of the pelvis, abdomen, chest, or head.  She denies recent fevers, chills, chest pain, shortness of breath, cough, nausea, vomiting, abdominal pain, urinary symptoms, abnormal bowel movements.  She denies numbness or tingling of the foot.  She is not on blood thinners.  She has a history of high blood pressure, was started on lisinopril 1 month ago.  She also has a history of bipolar for which she takes multiple medications.  No changes recently.  HPI  Past Medical History:  Diagnosis Date  . Bipolar disorder ([place])   . Hypertension     There are no active problems to display for this patient.   Past Surgical History:  Procedure Laterality Date  . bladder tack    . BREAST LUMPECTOMY    . CARPAL TUNNEL RELEASE Bilateral   . CHOLECYSTECTOMY    . LAMINECTOMY     neck  . RADICAL HYSTERECTOMY    . spinal fusion       OB History   None      Home Medications    Prior to Admission medications   Medication Sig Start Date End Date Taking? Authorizing Provider  amitriptyline (ELAVIL) 25 MG tablet Take 25 mg by mouth at bedtime.   Yes [provider]  atorvastatin  (LIPITOR) 20 MG tablet Take 1 tablet (20 mg total) by mouth as directed. 09/04/16  Yes Domenic Moras, PA-C  diazepam (VALIUM) 2 MG tablet Take 1 tablet (2 mg total) by mouth every 6 (six) hours as needed for anxiety. 09/04/16  Yes Domenic Moras, PA-C  gabapentin (NEURONTIN) 100 MG capsule Take 1 capsule (100 mg total) by mouth 3 (three) times daily. 09/04/16  Yes Domenic Moras, PA-C  QUEtiapine (SEROQUEL) 200 MG tablet Take 1 tablet (200 mg total) by mouth 2 (two) times daily. 09/04/16  Yes Domenic Moras, PA-C  divalproex (DEPAKOTE) 250 MG DR tablet Take 1 tablet (250 mg total) by mouth daily. Patient not taking: Reported on 12/18/2017 09/04/16   Domenic Moras, PA-C  HYDROcodone-acetaminophen (NORCO/VICODIN) 5-325 MG tablet Take 1 tablet by mouth every 6 (six) hours as needed for severe pain. 12/18/17   Clarke Amburn, PA-C  ibuprofen (ADVIL,MOTRIN) 200 MG tablet Take 4 tablets (800 mg total) by mouth 3 (three) times daily. 12/18/17   [name] Bogden, PA-C  mirtazapine (REMERON) 45 MG tablet Take 1 tablet (45 mg total) by mouth at bedtime. Patient not taking: Reported on 12/18/2017 09/04/16   Domenic Moras, PA-C    Family History History reviewed. No pertinent family history.  Social History Social History   Tobacco Use  .  Smoking status: Current Every Day Smoker    Packs/day: 0.50    Types: Cigarettes  . Smokeless tobacco: Never Used  Substance Use Topics  . Alcohol use: No  . Drug use: No     Allergies   Penicillins   Review of Systems Review of Systems  Musculoskeletal: Positive for arthralgias and joint swelling.  Neurological: Positive for dizziness (not currently).  All other systems reviewed and are negative.    Physical Exam Updated Vital Signs BP (!) 142/75   Pulse 97   Temp 97.9 F (36.6 C) (Oral)   Resp 18   SpO2 98%   Physical Exam  Constitutional: She is oriented to person, place, and time. She appears well-developed and well-nourished. No distress.  HENT:  Head:  Normocephalic and atraumatic.  Mouth/Throat: Uvula is midline, oropharynx is clear and moist and mucous membranes are normal.  No obvious head injury.  No laceration or hematoma.  Mild tenderness palpation of occiput  Eyes: Pupils are equal, round, and reactive to light. Conjunctivae and EOM are normal.  Neck: Normal range of motion. Neck supple.  No tenderness to palpation of C-spine. No step offs  Cardiovascular: Normal rate, regular rhythm and intact distal pulses.  Pulmonary/Chest: Effort normal and breath sounds normal. No respiratory distress. She has no wheezes.  Abdominal: Soft. She exhibits no distension and no mass. There is no tenderness. There is no guarding.  No TTP of the abd. No rigidity, guarding or distention.  Musculoskeletal: She exhibits edema and tenderness.  Obvious swelling of the right ankle bilaterally.  Mild tenderness palpation of the right knee.  No significant swelling or injury.  Pedal pulses intact bilaterally.  Color and warmth equal bilaterally.  Sensation intact bilaterally.  Soft compartments.  No active range of motion of the right ankle due to pain.  No obvious injury or deformity noted elsewhere.  Grip strength of upper extremities intact bilaterally.  Radial pulses equal bilaterally.  Neurological: She is alert and oriented to person, place, and time.  Skin: Skin is warm and dry.  Superficial skin tear of the left forearm without active bleeding.  No obvious injury elsewhere.  Psychiatric: She has a normal mood and affect.  Nursing note and vitals reviewed.    ED Treatments / Results  Labs (all labs ordered are listed, but only abnormal results are displayed) Labs Reviewed  CBC - Abnormal; Notable for the following components:      Result Value   WBC 13.6 (*)    RDW 16.1 (*)    All other components within normal limits  COMPREHENSIVE METABOLIC PANEL - Abnormal; Notable for the following components:   Glucose, Bld 107 (*)    BUN 31 (*)     Creatinine, Ser 1.75 (*)    Calcium 8.4 (*)    Albumin 3.4 (*)    ALT 67 (*)    Alkaline Phosphatase 135 (*)    GFR calc non Af Amer 28 (*)    GFR calc Af Amer 33 (*)    All other components within normal limits  CK - Abnormal; Notable for the following components:   Total CK 316 (*)    All other components within normal limits  VALPROIC ACID LEVEL - Abnormal; Notable for the following components:   Valproic Acid Lvl <10 (*)    All other components within normal limits  URINALYSIS, ROUTINE W REFLEX MICROSCOPIC  CBG MONITORING, ED  I-STAT CG4 LACTIC ACID, ED  I-STAT TROPONIN, ED  EKG EKG Interpretation   ED ECG REPORT   Date: 12/19/2017  16:50  Rate: 89  Rhythm: sinus rhythm  R Axis: -12  QT Interval/QTc: 422/514    Interpretation: LBBB and t wave inversions in leads 1, AVL, V5, V6, No STEMI  Old EKG Reviewed: none available   Date/Time:  Tuesday December 18 2017 17:55:01 EDT Ventricular Rate:  96 PR Interval:    QRS Duration: 156 QT Interval:  422 QTC Calculation: 534 R Axis:   -12 Text Interpretation:  sinus rhythm  Multiple ventricular premature complexes Left bundle branch block When compared to prior, no significant changes seen.  No STEMI Confirmed by Antony Blackbird [phone]) on 12/18/2017 6:01:44 PM Also confirmed by Antony Blackbird [phone]), editor Philomena Doheny [phone])  on 12/19/2017 11:42:08 AM   Radiology Dg Chest 2 View  Result Date: 12/18/2017 CLINICAL DATA:  72 year old female status post fall.  Dizziness. EXAM: CHEST - 2 VIEW COMPARISON:  None. FINDINGS: Semi upright AP and lateral views of the chest. Mild to moderate cardiomegaly. Other mediastinal contours are within normal limits. Visualized tracheal air column is within normal limits. Mild elevation of the left hemidiaphragm. No pneumothorax, pulmonary edema, pleural effusion or confluent pulmonary opacity. Lower thoracic spinal stimulator device in place. No acute osseous abnormality identified. Negative  visible bowel gas pattern. Central upper abdomen surgical clips are noted. IMPRESSION: No acute cardiopulmonary abnormality. Mild-to-moderate cardiomegaly. Electronically Signed   By: Genevie Ann M.D.   On: 12/18/2017 17:36   Dg Ankle Complete Right  Result Date: 12/18/2017 CLINICAL DATA:  Fall with right ankle pain EXAM: RIGHT ANKLE - COMPLETE 3+ VIEW COMPARISON:  None. FINDINGS: There are comminuted fractures of the distal right tibia and fibula. The lateral malleolus is displaced laterally. Moderate soft tissue swelling. IMPRESSION: Comminuted fractures of the distal right tibia and fibula. Electronically Signed   By: Ulyses Jarred M.D.   On: 12/18/2017 17:26   Ct Head Wo Contrast  Result Date: 12/18/2017 CLINICAL DATA:  72 year old female status post fall.  Dizziness. EXAM: CT HEAD WITHOUT CONTRAST TECHNIQUE: Contiguous axial images were obtained from the base of the skull through the vertex without intravenous contrast. COMPARISON:  None. FINDINGS: Brain: Cerebral volume is within normal limits for age. 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 No midline shift, ventriculomegaly, mass effect, evidence of mass lesion, intracranial hemorrhage or evidence of cortically based acute infarction. Gray-white matter differentiation is within normal limits throughout the brain. No cortical encephalomalacia identified. Vascular: Calcified atherosclerosis at the skull base. No suspicious intracranial vascular hyperdensity. Skull: No acute osseous abnormality identified. Sinuses/Orbits: Visualized paranasal sinuses and mastoids are stable and well pneumatized. Other: Postoperative changes to both globes, otherwise negative orbits soft tissues. Visualized scalp soft tissues are within normal limits. IMPRESSION: Normal for age non contrast CT appearance of the brain. Electronically Signed   By: Genevie Ann M.D.   On: 12/18/2017 17:42   Ct Ankle Right Wo Contrast  Result Date:  12/18/2017 CLINICAL DATA:  The patient suffered a fall today due to dizziness resulting in a right ankle fracture. Initial encounter. EXAM: CT OF THE RIGHT ANKLE WITHOUT CONTRAST TECHNIQUE: Multidetector CT imaging of the right ankle was performed according to the standard protocol. Multiplanar CT image reconstructions were also generated. COMPARISON:  Plain films right ankle this same day. FINDINGS: Bones/Joint/Cartilage As seen on the comparison plain films, the patient has an acute fracture of the posterior malleolus. The fracture is oblique in orientation extending from  the cortex of the posterior diaphysis approximately 3.5 cm above the plafond in an inferior and anterior orientation through the mid plafond at its lateral periphery. The fracture fragment may measures 3.5 cm craniocaudal by 3.1 cm transverse by 1.6 cm AP at its lateral margin. The fracture fragment has a triangular configuration at the plafond with the base of the triangle adjacent to the fibula. Gap in the articular surface of the plafond measures 0.7 cm at its lateral margin. The fracture fragment is impacted approximately 0.3 cm. The patient also has a fracture of the distal fibula which is oblique in orientation extending from the posterior cortex approximately 3 cm above the tip of the lateral malleolus in an anterior and inferior orientation exiting the anterior cortex approximately 2 cm above the lateral malleolus. The distal fracture fragment is posteriorly displaced 1 cm with fragment override of approximately 0.5 cm. No other fracture is identified. The syndesmosis is not widened. No osteochondral lesion of the talar dome. Ligaments Suboptimally assessed by CT. Muscles and Tendons No tendon entrapment is seen. Soft tissues There is soft tissue contusion and hematoma about the ankle. IMPRESSION: Acute posterior and lateral malleolar fractures as described above. Electronically Signed   By: Inge Rise M.D.   On: 12/18/2017 21:14    Dg Knee Complete 4 Views Right  Result Date: 12/18/2017 CLINICAL DATA:  Right knee pain since a fall approximately 2 hours ago. Initial encounter. EXAM: RIGHT KNEE - COMPLETE 4+ VIEW COMPARISON:  None. FINDINGS: No evidence of fracture, dislocation, or joint effusion. No evidence of arthropathy or other focal bone abnormality. Soft tissues are unremarkable. IMPRESSION: Negative exam. Electronically Signed   By: Inge Rise M.D.   On: 12/18/2017 17:24    Procedures Procedures (including critical care time)  Medications Ordered in ED Medications  fentaNYL (SUBLIMAZE) injection 50 mcg (50 mcg Intravenous Given 12/18/17 1652)  fentaNYL (SUBLIMAZE) injection 50 mcg (50 mcg Intravenous Given 12/18/17 1808)  morphine 4 MG/ML injection 4 mg (4 mg Intravenous Given 12/18/17 1956)  HYDROcodone-acetaminophen (NORCO/VICODIN) 5-325 MG per tablet 1 tablet (1 tablet Oral Given 12/18/17 2224)     Initial Impression / Assessment and Plan / ED Course  I have reviewed the triage vital signs and the nursing notes.  Pertinent labs & imaging results that were available during my care of the patient were reviewed by me and considered in my medical decision making (see chart for details).     Patient presenting for evaluation of right ankle pain after a syncopal episode.  Physical exam reassuring, no neurologic deficits.  No dizziness currently.  Tenderness palpation and obvious swelling of the right ankle.  Pedal pulses intact bilaterally.  Sensation intact.  Blood pressure low, in the 71I systolic.  Will start IV fluids for blood pressure, order labs, x-ray ankle, knee, and CT head.  Discussed with attending, Dr. Sherry Ruffing evaluated patient.  Blood pressure improving with fluids.  Labs show AK I and dehydration.  Otherwise reassuring.  Chest x-ray viewed and interpreted by me, negative for infection or injury.  Right ankle x-ray shows comminuted fracture of distal tib-fib.  Mortise intact.  Knee x-ray without  acute findings.  CT head negative for fracture or bleed.  EKG shows left bundle, no previous to compare.  No chest pain or shortness of breath.  Troponin negative.  Will repeat EKG in an hour to ensure no changes.  Repeat EKG shows no changes.  Doubt cardiac cause for her symptoms.  Will have patient follow-up  with cardiology due to cardiomegaly and left bundle.  Will have patient follow-up with primary care for reevaluation of kidney function and blood pressure.  Will have patient stop lisinopril at this time.  Discussed with Dr. Lorin Mercy from orthopedics, who recommends posterior short leg splint, nonweightbearing, and follow-up in the office tomorrow the following day.  Request CT for further evaluation of the ankle.  Splint placed, good cap refill and sensation after splint placement.  Patient unable to ambulate with crutches.  Offered admission, pt requests to go home with wheelchair-- states she has a friend who can help. Pt states she will be safe if she goes home in a wheelchair.  At this time, patient received discharge.  Return precautions given.  Patient states she understands and agrees plan.  Final Clinical Impressions(s) / ED Diagnoses   Final diagnoses:  Closed fracture of right tibia and fibula, initial encounter  Dehydration  AKI (acute kidney injury) [facility])    ED Discharge Orders        Ordered    HYDROcodone-acetaminophen (NORCO/VICODIN) 5-325 MG tablet  Every 6 hours PRN     12/18/17 2153    ibuprofen (ADVIL,MOTRIN) 200 MG tablet  3 times daily     12/18/17 2153       Franchot Heidelberg, PA-C 12/18/17 2240    Tegeler, Gwenyth Allegra, MD 12/19/17 Lefors, Rhiley Solem, PA-C 01/02/18 1151    Tegeler, Gwenyth Allegra, MD 01/02/18 1755

## 2017-12-18 NOTE — Progress Notes (Signed)
Consult request for a wheelchair has been received. CSW attempting to follow up at present time.  CSW unable to assist with medical equipment.  CN is placing a CM consult and will speak to CM.  Please reconsult if future social work needs arise.  CSW signing off, as social work intervention is no longer needed.  Alphonse Guild. Zyaira Vejar, LCSW, LCAS, CSI Clinical Social Worker Ph: [phone]

## 2017-12-18 NOTE — ED Notes (Signed)
Pt aware of need for urine specimen. 

## 2017-12-18 NOTE — ED Notes (Signed)
[facility] called for pt 

## 2017-12-18 NOTE — ED Notes (Signed)
Message left with Maria [name]/case manager for Maria [name], who comes in in the am to arrange for wheelchair for patient. Maria [name], the case manager tonight is unable to arrange for a wheelchair tonight and patient does not want to stay in the ED all night. Patient states to primary RN, Colletta Maryland, that she has a friend who will stay with her tonight to help her to the bathroom. Patient verbalizes understanding there is no weight bearing to the right lower extremity.

## 2017-12-18 NOTE — Care Management (Signed)
ED CM spoke with Terri CN at [facility] ED concerning DME. CM explained that she would need to contact daytime [facility] ED CM unable to assist with DME tonight.

## 2017-12-18 NOTE — ED Notes (Signed)
Voicemail left with Mariann Laster the case manager about arranging for a wheelchair for patient's home use.

## 2017-12-18 NOTE — ED Triage Notes (Signed)
Patient arrived via [facility] from home. Patient called out for a fall a couple of hours ago c/o of right ankle pain. Swelling to that ankle. Patient c/o of dizziness when standing but then goes away. No LOC. No neck or back pain. Ice on ankle now. 20 Left AC. 500 ns given. HX. Hypertension.

## 2017-12-19 ENCOUNTER — Telehealth: Payer: Self-pay | Admitting: Emergency Medicine

## 2017-12-19 DIAGNOSIS — G8911 Acute pain due to trauma: Secondary | ICD-10-CM | POA: Diagnosis not present

## 2017-12-19 DIAGNOSIS — S8990XA Unspecified injury of unspecified lower leg, initial encounter: Secondary | ICD-10-CM | POA: Diagnosis not present

## 2017-12-19 NOTE — Telephone Encounter (Signed)
CM received a return call from pt and her son asking about the wheelchair.  CM again advised for her to speak to her PCP for orders to be sent to [facility] for them to deliver the wheelchair and a 3-in-1.  Son acknowledged understanding.  No further CM needs noted.

## 2017-12-19 NOTE — Telephone Encounter (Signed)
CM consulted for a wheelchair.  No orders were placed for pt and pt has left [facility] ED.  Contacted pt and left a VM advising her to contact her PCP for DME orders.  No further CM needs noted at this time.

## 2017-12-20 ENCOUNTER — Telehealth (INDEPENDENT_AMBULATORY_CARE_PROVIDER_SITE_OTHER): Payer: Self-pay | Admitting: Orthopaedic Surgery

## 2017-12-20 NOTE — Telephone Encounter (Signed)
noted 

## 2017-12-20 NOTE — Telephone Encounter (Signed)
IC patient and discussed.  I have faxed the order for all three to [facility], and specified that she is homebound at present and will need items delivered.

## 2017-12-20 NOTE — Telephone Encounter (Signed)
Ok thanks 

## 2017-12-20 NOTE — Telephone Encounter (Signed)
Message sent in error

## 2017-12-20 NOTE — Telephone Encounter (Signed)
Patient called advised she need an order for a wheelchair, a walker and a bedside commode. The number to contact patient is [phone]

## 2017-12-20 NOTE — Telephone Encounter (Signed)
Please advise 

## 2017-12-20 NOTE — Telephone Encounter (Signed)
Pt needed shower chair as well

## 2017-12-20 NOTE — Telephone Encounter (Signed)
Patient called advised she has been sitting in her chair since she got out of the hospital 12/19/17. Patient said she can not get up to go to the bathroom and she is wetting herself and everything. Patient is asking for an order for a wheelchair, walker, bedside commode and a shower chair.  Patient asked if she can get these things ordered today. The number to contact patient is [phone]

## 2017-12-20 NOTE — Telephone Encounter (Signed)
Please order for patient. Thanks.

## 2017-12-21 NOTE — Telephone Encounter (Signed)
Let them know that the orders were sent over to [facility] yesterday, just wanting to know when it would be delivered. She's been wetting herself and cannot move out of the recliner. CB # [phone]

## 2017-12-21 NOTE — Telephone Encounter (Signed)
I spoke with [facility]. They are faxing forms that need MD signature and state they may need a note as to why patient needs the equipment. I was also informed that they do not deliver wheelchairs and that the patient would have to pick it up once authorized. They close at 5 today and will not reopen until Monday.  Awaiting forms to be faxed from [place] at [facility].

## 2017-12-21 NOTE — Telephone Encounter (Signed)
noted 

## 2017-12-21 NOTE — Telephone Encounter (Signed)
I spoke with patient, she says she can get by with the shower chair, bedside commode, and walker.  She declines wheelchair at this time.  I have s/w [facility] as well, and they do not deliver any of these items.  I have advised patient she will need to get her husband to somehow find a ride/call a cab to take him to the [facility] store on [address] to get these items. She understands.   FYI only.

## 2017-12-24 ENCOUNTER — Telehealth (INDEPENDENT_AMBULATORY_CARE_PROVIDER_SITE_OTHER): Payer: Self-pay | Admitting: Orthopaedic Surgery

## 2017-12-24 MED ORDER — TRAMADOL HCL 50 MG PO TABS: 50.0000 mg | ORAL_TABLET | Freq: Four times a day (QID) | ORAL | 0 refills | Status: DC | PRN

## 2017-12-24 NOTE — Telephone Encounter (Signed)
I spoke with patient, she is aware [facility] will pick her up tomorrow 9am.  I have called the pharmacy with her Rx tramadol.

## 2017-12-24 NOTE — Telephone Encounter (Signed)
Patient call stated needed orders or RX for [facility].portable potty and shower bench.  Please call patient to advise

## 2017-12-24 NOTE — Telephone Encounter (Signed)
Patient called and I spoke to her and she wants [place] to send the orders for her DME to [facility].  I called them at [phone], and they have everything except the shower chair.  I will fax orders to [phone].  I will call patient to advise as well.

## 2017-12-24 NOTE — Telephone Encounter (Signed)
Call in ultram # [phone] 1 po q 6 hrs prn  [facility] on [address] ( [address]) .   Come see me about getting her transported to [facility] hospital on Tuesday for admission and surgery on Wednesday.

## 2017-12-24 NOTE — Telephone Encounter (Signed)
I have called patient and LMVM, need to discuss meds to call in and also plans for her surgery/pickup for surgery. Will discuss when she calls back.

## 2017-12-24 NOTE — Telephone Encounter (Signed)
I have been working with patient to get her the DME she needs.   She says she needs something for pain.  They do not have transportation to get here to get a Rx.  Please advise.

## 2017-12-24 NOTE — Telephone Encounter (Signed)
IC and got voice mail.  LMVM to call me back.

## 2017-12-25 ENCOUNTER — Other Ambulatory Visit: Payer: Self-pay

## 2017-12-25 ENCOUNTER — Telehealth (INDEPENDENT_AMBULATORY_CARE_PROVIDER_SITE_OTHER): Payer: Self-pay

## 2017-12-25 ENCOUNTER — Encounter (HOSPITAL_COMMUNITY): Payer: Self-pay | Admitting: General Practice

## 2017-12-25 ENCOUNTER — Inpatient Hospital Stay (HOSPITAL_COMMUNITY)
Admission: AD | Admit: 2017-12-25 | Discharge: 2018-01-01 | DRG: 494 | Disposition: A | Discharge status: Skilled Nursing Facility | Payer: Medicare HMO | Source: Ambulatory Visit | Attending: Orthopaedic Surgery | Admitting: Orthopaedic Surgery

## 2017-12-25 ENCOUNTER — Inpatient Hospital Stay (INDEPENDENT_AMBULATORY_CARE_PROVIDER_SITE_OTHER): Payer: Self-pay | Admitting: Orthopaedic Surgery

## 2017-12-25 ENCOUNTER — Inpatient Hospital Stay (HOSPITAL_COMMUNITY): Payer: Medicare HMO

## 2017-12-25 DIAGNOSIS — S82899A Other fracture of unspecified lower leg, initial encounter for closed fracture: Secondary | ICD-10-CM

## 2017-12-25 DIAGNOSIS — S82891A Other fracture of right lower leg, initial encounter for closed fracture: Secondary | ICD-10-CM | POA: Diagnosis present

## 2017-12-25 DIAGNOSIS — Z981 Arthrodesis status: Secondary | ICD-10-CM | POA: Diagnosis not present

## 2017-12-25 DIAGNOSIS — I10 Essential (primary) hypertension: Secondary | ICD-10-CM | POA: Diagnosis not present

## 2017-12-25 DIAGNOSIS — Z01818 Encounter for other preprocedural examination: Secondary | ICD-10-CM

## 2017-12-25 DIAGNOSIS — R278 Other lack of coordination: Secondary | ICD-10-CM | POA: Diagnosis not present

## 2017-12-25 DIAGNOSIS — R2689 Other abnormalities of gait and mobility: Secondary | ICD-10-CM | POA: Diagnosis not present

## 2017-12-25 DIAGNOSIS — S82841G Displaced bimalleolar fracture of right lower leg, subsequent encounter for closed fracture with delayed healing: Secondary | ICD-10-CM | POA: Diagnosis not present

## 2017-12-25 DIAGNOSIS — W19XXXA Unspecified fall, initial encounter: Secondary | ICD-10-CM | POA: Diagnosis present

## 2017-12-25 DIAGNOSIS — S8261XD Displaced fracture of lateral malleolus of right fibula, subsequent encounter for closed fracture with routine healing: Secondary | ICD-10-CM | POA: Diagnosis not present

## 2017-12-25 DIAGNOSIS — R05 Cough: Secondary | ICD-10-CM | POA: Diagnosis not present

## 2017-12-25 DIAGNOSIS — F17210 Nicotine dependence, cigarettes, uncomplicated: Secondary | ICD-10-CM | POA: Diagnosis present

## 2017-12-25 DIAGNOSIS — S8002XA Contusion of left knee, initial encounter: Secondary | ICD-10-CM | POA: Diagnosis not present

## 2017-12-25 DIAGNOSIS — S8990XA Unspecified injury of unspecified lower leg, initial encounter: Secondary | ICD-10-CM | POA: Diagnosis not present

## 2017-12-25 DIAGNOSIS — F319 Bipolar disorder, unspecified: Secondary | ICD-10-CM | POA: Diagnosis not present

## 2017-12-25 DIAGNOSIS — S8261XA Displaced fracture of lateral malleolus of right fibula, initial encounter for closed fracture: Secondary | ICD-10-CM | POA: Diagnosis not present

## 2017-12-25 DIAGNOSIS — Z79899 Other long term (current) drug therapy: Secondary | ICD-10-CM

## 2017-12-25 DIAGNOSIS — T148XXA Other injury of unspecified body region, initial encounter: Secondary | ICD-10-CM

## 2017-12-25 DIAGNOSIS — S82841A Displaced bimalleolar fracture of right lower leg, initial encounter for closed fracture: Principal | ICD-10-CM | POA: Diagnosis present

## 2017-12-25 DIAGNOSIS — Z880 Allergy status to penicillin: Secondary | ICD-10-CM

## 2017-12-25 DIAGNOSIS — Z9181 History of falling: Secondary | ICD-10-CM | POA: Diagnosis not present

## 2017-12-25 DIAGNOSIS — R531 Weakness: Secondary | ICD-10-CM | POA: Diagnosis present

## 2017-12-25 DIAGNOSIS — M1990 Unspecified osteoarthritis, unspecified site: Secondary | ICD-10-CM | POA: Diagnosis present

## 2017-12-25 DIAGNOSIS — G8918 Other acute postprocedural pain: Secondary | ICD-10-CM | POA: Diagnosis not present

## 2017-12-25 DIAGNOSIS — R059 Cough, unspecified: Secondary | ICD-10-CM

## 2017-12-25 DIAGNOSIS — G8911 Acute pain due to trauma: Secondary | ICD-10-CM | POA: Diagnosis not present

## 2017-12-25 DIAGNOSIS — M6281 Muscle weakness (generalized): Secondary | ICD-10-CM | POA: Diagnosis not present

## 2017-12-25 HISTORY — DX: Other fracture of unspecified lower leg, initial encounter for closed fracture: S82.899A

## 2017-12-25 HISTORY — DX: Personal history of urinary calculi: Z87.442

## 2017-12-25 HISTORY — DX: Unspecified osteoarthritis, unspecified site: M19.90

## 2017-12-25 LAB — COMPREHENSIVE METABOLIC PANEL
ALK PHOS: 122 U/L (ref 38–126)
ALT: 21 U/L (ref 14–54)
ANION GAP: 12 (ref 5–15)
AST: 17 U/L (ref 15–41)
Albumin: 3.2 g/dL — ABNORMAL LOW (ref 3.5–5.0)
BILIRUBIN TOTAL: 0.5 mg/dL (ref 0.3–1.2)
BUN: 30 mg/dL — ABNORMAL HIGH (ref 6–20)
CALCIUM: 8.7 mg/dL — AB (ref 8.9–10.3)
CO2: 22 mmol/L (ref 22–32)
CREATININE: 0.95 mg/dL (ref 0.44–1.00)
Chloride: 105 mmol/L (ref 101–111)
GFR, EST NON AFRICAN AMERICAN: 59 mL/min — AB (ref >60)
Glucose, Bld: 86 mg/dL (ref 65–99)
Potassium: 4.1 mmol/L (ref 3.5–5.1)
SODIUM: 139 mmol/L (ref 135–145)
TOTAL PROTEIN: 6.5 g/dL (ref 6.5–8.1)

## 2017-12-25 LAB — CBC
HCT: 40.3 % (ref 36.0–46.0)
HEMOGLOBIN: 12.8 g/dL (ref 12.0–15.0)
MCH: 27.1 pg (ref 26.0–34.0)
MCHC: 31.8 g/dL (ref 30.0–36.0)
MCV: 85.4 fL (ref 78.0–100.0)
Platelets: 256 10*3/uL (ref 150–400)
RBC: 4.72 MIL/uL (ref 3.87–5.11)
RDW: 16.3 % — ABNORMAL HIGH (ref 11.5–15.5)
WBC: 8.8 10*3/uL (ref 4.0–10.5)

## 2017-12-25 LAB — URINALYSIS, COMPLETE (UACMP) WITH MICROSCOPIC
BILIRUBIN URINE: NEGATIVE
GLUCOSE, UA: NEGATIVE mg/dL
KETONES UR: NEGATIVE mg/dL
NITRITE: POSITIVE — AB
PH: 5 (ref 5.0–8.0)
Protein, ur: NEGATIVE mg/dL
SPECIFIC GRAVITY, URINE: 1.02 (ref 1.005–1.030)

## 2017-12-25 LAB — SURGICAL PCR SCREEN
MRSA, PCR: NEGATIVE
Staphylococcus aureus: NEGATIVE

## 2017-12-25 MED ORDER — DOCUSATE SODIUM 100 MG PO CAPS
100.0000 mg | ORAL_CAPSULE | Freq: Two times a day (BID) | ORAL | Status: DC
  Administered 2017-12-25 – 2017-12-31 (×7): 100 mg via ORAL
  Filled 2017-12-25 (×13): qty 1

## 2017-12-25 MED ORDER — ONDANSETRON HCL 4 MG/2ML IJ SOLN
4.0000 mg | Freq: Four times a day (QID) | INTRAMUSCULAR | Status: DC | PRN
  Filled 2017-12-25: qty 2

## 2017-12-25 MED ORDER — NICOTINE 14 MG/24HR TD PT24
14.0000 mg | MEDICATED_PATCH | Freq: Every day | TRANSDERMAL | Status: DC
  Administered 2017-12-25 – 2018-01-01 (×8): 14 mg via TRANSDERMAL
  Filled 2017-12-25 (×8): qty 1

## 2017-12-25 MED ORDER — VANCOMYCIN HCL IN DEXTROSE 1-5 GM/200ML-% IV SOLN
1000.0000 mg | INTRAVENOUS | Status: AC
  Filled 2017-12-25: qty 200

## 2017-12-25 MED ORDER — POLYETHYLENE GLYCOL 3350 17 G PO PACK
17.0000 g | PACK | Freq: Every day | ORAL | Status: DC | PRN
  Administered 2017-12-28 – 2017-12-31 (×4): 17 g via ORAL
  Filled 2017-12-25 (×4): qty 1

## 2017-12-25 MED ORDER — HYDROCODONE-ACETAMINOPHEN 5-325 MG PO TABS
1.0000 | ORAL_TABLET | Freq: Four times a day (QID) | ORAL | Status: DC | PRN
  Administered 2017-12-25 – 2017-12-29 (×9): 2 via ORAL
  Administered 2017-12-30: 1 via ORAL
  Administered 2017-12-30 – 2018-01-01 (×8): 2 via ORAL
  Filled 2017-12-25 (×12): qty 2
  Filled 2017-12-25: qty 1
  Filled 2017-12-25 (×6): qty 2

## 2017-12-25 MED ORDER — ONDANSETRON HCL 4 MG PO TABS: 4.0000 mg | ORAL_TABLET | Freq: Four times a day (QID) | ORAL | Status: DC | PRN

## 2017-12-25 NOTE — Telephone Encounter (Signed)
Ailene Ravel with [facility] [address] called stating that patient is requesting a Nicotine Patch, patient smokes half a pack a day. Patient stated she would like to have a pain medicine in between taking the Norco/Vicodin, such as Tramadol or Tylenol.  CB# is [phone].  Ailene Ravel stated that Dr. Lorin Mercy can put a note in patient's chart.  Please advise.  Thank you.

## 2017-12-25 NOTE — H&P (Addendum)
Maria [name] is an 72 y.o. female.   Chief Complaint: Right closed bimalleolar ankle fracture displaced. HPI: 72 year old female with past history of hypertension and bipolar disorder had a syncopal episode after getting up quickly got lightheaded fell and suffered the above closed ankle injury with displacement.  CT scan showed displacement of the posterior malleolus and fibular fracture with some widening of the medial clear space.  I was called when patient was in the emergency room on 12/18/2017 and plan was for office follow-up in 1 to 2 days with walker, wheelchair and bedside commode for home use.  Patient went home and is been in a recliner is not been able to get out of bed until yesterday when her husband finally got her out of bed to a chair.  He was able to give her a bath she has not been able to get out of bed for bathroom use.  No wheelchair or walker was applied.  Total of 10 phone calls have been made trying to get appropriate home health medical equipment supplied to the patient without success and patient was told to come into the emergency room for admission on Tuesday for surgical fixation of her ankle on Wednesday.  Due to her age and generalized weakness she has not been able to limit weightbearing when she transfers out of bed to the recliner. Previous chest x-ray and EKG 12/18/2017 were normal. Past Medical History:  Diagnosis Date  . Arthritis   . Bipolar disorder ([place])   . Fracture, ankle 12/25/2017   closed right ankle fracture  . History of kidney stones   . Hypertension     Past Surgical History:  Procedure Laterality Date  . bladder tack    . BREAST LUMPECTOMY    . CARPAL TUNNEL RELEASE Bilateral   . CHOLECYSTECTOMY    . LAMINECTOMY     neck  . RADICAL HYSTERECTOMY    . spinal fusion      History reviewed. No pertinent family history. Social History:  reports that she has been smoking cigarettes.  She has been smoking about 0.50 packs per day. She has never used  smokeless tobacco. She reports that she does not drink alcohol or use drugs.  Allergies:  Allergies  Allergen Reactions  . Penicillins Other (See Comments)    Has patient had a PCN reaction causing immediate rash, facial/tongue/throat swelling, SOB or lightheadedness with hypotension: Yes Has patient had a PCN reaction causing severe rash involving mucus membranes or skin necrosis: Yes Has patient had a PCN reaction that required hospitalization Yes Has patient had a PCN reaction occurring within the last 10 years: No If all of the above answers are "NO", then may proceed with Cephalosporin use.     Medications Prior to Admission  Medication Sig Dispense Refill  . amitriptyline (ELAVIL) 25 MG tablet Take 25 mg by mouth at bedtime.    . clonazePAM (KLONOPIN) 1 MG tablet Take 1 mg by mouth 2 (two) times daily as needed for anxiety.    Marland Kitchen lisinopril (PRINIVIL,ZESTRIL) 10 MG tablet Take 10 mg by mouth daily.    . QUEtiapine (SEROQUEL) 200 MG tablet Take 1 tablet (200 mg total) by mouth 2 (two) times daily. 10 tablet 0  . traMADol (ULTRAM) 50 MG tablet Take 1 tablet (50 mg total) by mouth every 6 (six) hours as needed. 30 tablet 0  . atorvastatin (LIPITOR) 20 MG tablet Take 1 tablet (20 mg total) by mouth as directed. (Patient not taking: Reported on  12/25/2017) 5 tablet 0  . divalproex (DEPAKOTE) 250 MG DR tablet Take 1 tablet (250 mg total) by mouth daily. (Patient not taking: Reported on 12/18/2017) 5 tablet 0  . gabapentin (NEURONTIN) 100 MG capsule Take 1 capsule (100 mg total) by mouth 3 (three) times daily. (Patient not taking: Reported on 12/25/2017) 15 capsule 0  . HYDROcodone-acetaminophen (NORCO/VICODIN) 5-325 MG tablet Take 1 tablet by mouth every 6 (six) hours as needed for severe pain. (Patient not taking: Reported on 12/25/2017) 8 tablet 0  . ibuprofen (ADVIL,MOTRIN) 200 MG tablet Take 4 tablets (800 mg total) by mouth 3 (three) times daily. (Patient not taking: Reported on 12/25/2017) 10  tablet 0  . mirtazapine (REMERON) 45 MG tablet Take 1 tablet (45 mg total) by mouth at bedtime. (Patient not taking: Reported on 12/18/2017) 5 tablet 0    Results for orders placed or performed during the hospital encounter of 12/25/17 (from the past 48 hour(s))  Urinalysis, Complete w Microscopic     Status: Abnormal   Collection Time: 12/25/17 10:48 AM  Result Value Ref Range   Color, Urine YELLOW YELLOW   APPearance CLOUDY (A) CLEAR   Specific Gravity, Urine 1.020 1.005 - 1.030   pH 5.0 5.0 - 8.0   Glucose, UA NEGATIVE NEGATIVE mg/dL   Hgb urine dipstick SMALL (A) NEGATIVE   Bilirubin Urine NEGATIVE NEGATIVE   Ketones, ur NEGATIVE NEGATIVE mg/dL   Protein, ur NEGATIVE NEGATIVE mg/dL   Nitrite POSITIVE (A) NEGATIVE   Leukocytes, UA MODERATE (A) NEGATIVE   RBC / HPF 0-5 0 - 5 RBC/hpf   WBC, UA 6-30 0 - 5 WBC/hpf   Bacteria, UA MANY (A) NONE SEEN   Squamous Epithelial / LPF 0-5 (A) NONE SEEN    Comment: Performed at [facility], [address], [place], [place] [address]  Surgical pcr screen     Status: None   Collection Time: 12/25/17 11:21 AM  Result Value Ref Range   MRSA, PCR NEGATIVE NEGATIVE   Staphylococcus aureus NEGATIVE NEGATIVE    Comment: (NOTE) The Xpert SA Assay (FDA approved for NASAL specimens in patients 40 years of age and older), is one component of a comprehensive surveillance program. It is not intended to diagnose infection nor to guide or monitor treatment. Performed at [facility], [place] [address], [place], [place] [address]   CBC     Status: Abnormal   Collection Time: 12/25/17 11:33 AM  Result Value Ref Range   WBC 8.8 4.0 - 10.5 K/uL   RBC 4.72 3.87 - 5.11 MIL/uL   Hemoglobin 12.8 12.0 - 15.0 g/dL   HCT 40.3 36.0 - 46.0 %   MCV 85.4 78.0 - 100.0 fL   MCH 27.1 26.0 - 34.0 pg   MCHC 31.8 30.0 - 36.0 g/dL   RDW 16.3 (H) 11.5 - 15.5 %   Platelets 256 150 - 400 K/uL    Comment: Performed at [facility], [place] [address]  [place], [place] [address]  Comprehensive metabolic panel     Status: Abnormal   Collection Time: 12/25/17 11:33 AM  Result Value Ref Range   Sodium 139 135 - 145 mmol/L   Potassium 4.1 3.5 - 5.1 mmol/L   Chloride 105 101 - 111 mmol/L   CO2 22 22 - 32 mmol/L   Glucose, Bld 86 65 - 99 mg/dL   BUN 30 (H) 6 - 20 mg/dL   Creatinine, Ser 0.95 0.44 - 1.00 mg/dL  Calcium 8.7 (L) 8.9 - 10.3 mg/dL   Total Protein 6.5 6.5 - 8.1 g/dL   Albumin 3.2 (L) 3.5 - 5.0 g/dL   AST 17 15 - 41 U/L   ALT 21 14 - 54 U/L   Alkaline Phosphatase 122 38 - 126 U/L   Total Bilirubin 0.5 0.3 - 1.2 mg/dL   GFR calc non Af Amer 59 (L) >60 mL/min   GFR calc Af Amer >60 >60 mL/min    Comment: (NOTE) The eGFR has been calculated using the CKD EPI equation. This calculation has not been validated in all clinical situations. eGFR's persistently <60 mL/min signify possible Chronic Kidney Disease.    Anion gap 12 5 - 15    Comment: Performed at [facility] [address] [place], [place] [address]   Dg Chest 2 View  Result Date: 12/25/2017 CLINICAL DATA:  Preop testing EXAM: CHEST - 2 VIEW COMPARISON:  12/18/2017 FINDINGS: The heart size and mediastinal contours are within normal limits. Both lungs are clear. The visualized skeletal structures are unremarkable. Spinal stimulator is again noted. IMPRESSION: No active cardiopulmonary disease. Electronically Signed   By: Kathreen Devoid   On: 12/25/2017 13:33    ROS 14 point review of systems positive for previous treatment for breast lumpectomy, carpal tunnel release bilaterally, cholecystectomy, laminectomy, lumbar fusion x2 and radical hysterectomy without anesthetic or bleeding problems.  She denies history of stroke cardiovascular events.  Patient does not take any blood pressure.  She does have hypertension.  Positive for bipolar disorder.  Patient lives with her husband they have no car she takes a bus when she does not walk to the grocery store occasionally rides a  bicycle for further distance.  14 point review of systems otherwise negative. Patient is 1/2 pack/day smoker.   Blood pressure 117/65, pulse 90, resp. rate 14, SpO2 94 %. Physical Exam  Constitutional: She is oriented to person, place, and time. She appears well-developed and well-nourished.  HENT:  Head: Normocephalic and atraumatic.  Eyes: Pupils are equal, round, and reactive to light. EOM are normal. Scleral icterus is present.  Neck: Normal range of motion. Neck supple. No tracheal deviation present. No thyromegaly present.  Cardiovascular: Normal rate.  Respiratory: Effort normal and breath sounds normal. No respiratory distress. She has no wheezes.  GI: Soft. She exhibits no distension. There is no tenderness.  Neurological: She is alert and oriented to person, place, and time.  Skin: Skin is warm. No erythema.  Mild right lateral ecchymosis.  No fracture blisters.  Mild swelling of the ankle on the right.  Distal pulses are intact.  Psychiatric: She has a normal mood and affect. Her behavior is normal. Thought content normal.  Orthopedic exam shows normal knee without effusion.  Short splint is removed ankle is inspected there are no fracture blisters.  Mild swelling of the ankle distal pulses are intact.  Splint is reapplied.  Assessment/Plan Displaced bimalleolar ankle fracture by CT scan with displacement of posterior malleolus involvement of the plafond.  Distal fibular shortening with some widening of the medial clear space.  Plan will be operative fixation of right ankle fracture.  She may require a syndesmotic screw.  Plan fixation of the fibula out to length and fixation of the posterior malleolus.  She expects to be in the hospital for approximately 2 days.  Possible skilled nursing facility versus home with wheelchair with removable side arms, home 3 and 1 commode and walker if she is able to  transfer safely.  If transfers or problems then she will require skilled nursing  facility for a few weeks.  Plan procedure discussed with patient risks of surgery discussed.  Questions were elicited and answered she understands and requests we proceed.  Marybelle Killings, MD 12/25/2017, 8:32 PM

## 2017-12-25 NOTE — Telephone Encounter (Signed)
Please advise 

## 2017-12-25 NOTE — Plan of Care (Signed)

## 2017-12-26 ENCOUNTER — Encounter (HOSPITAL_COMMUNITY): Payer: Self-pay | Admitting: Certified Registered Nurse Anesthetist

## 2017-12-26 ENCOUNTER — Inpatient Hospital Stay (HOSPITAL_COMMUNITY): Admission: RE | Admit: 2017-12-26 | Payer: Medicare HMO | Source: Ambulatory Visit | Admitting: Orthopaedic Surgery

## 2017-12-26 ENCOUNTER — Encounter (HOSPITAL_COMMUNITY)
Admission: AD | Disposition: A | Discharge status: Skilled Nursing Facility | Payer: Self-pay | Source: Ambulatory Visit | Attending: Orthopaedic Surgery

## 2017-12-26 ENCOUNTER — Inpatient Hospital Stay (HOSPITAL_COMMUNITY): Payer: Medicare HMO

## 2017-12-26 ENCOUNTER — Inpatient Hospital Stay (HOSPITAL_COMMUNITY): Payer: Medicare HMO | Admitting: Anesthesiology

## 2017-12-26 DIAGNOSIS — S82841A Displaced bimalleolar fracture of right lower leg, initial encounter for closed fracture: Secondary | ICD-10-CM

## 2017-12-26 HISTORY — PX: ORIF ANKLE FRACTURE: SHX5408

## 2017-12-26 SURGERY — OPEN REDUCTION INTERNAL FIXATION (ORIF) ANKLE FRACTURE
Anesthesia: General | Site: Ankle | Laterality: Right

## 2017-12-26 MED ORDER — DEXTROSE 5 % IV SOLN
INTRAVENOUS | Status: DC | PRN
  Administered 2017-12-26: 45 ug/min via INTRAVENOUS

## 2017-12-26 MED ORDER — METOCLOPRAMIDE HCL 5 MG PO TABS: 5.0000 mg | ORAL_TABLET | Freq: Three times a day (TID) | ORAL | Status: DC | PRN

## 2017-12-26 MED ORDER — ROPIVACAINE HCL 7.5 MG/ML IJ SOLN
INTRAMUSCULAR | Status: DC | PRN
  Administered 2017-12-26: 20 mL via PERINEURAL

## 2017-12-26 MED ORDER — HYDROMORPHONE HCL 2 MG/ML IJ SOLN
0.5000 mg | INTRAMUSCULAR | Status: DC | PRN
  Administered 2017-12-26 – 2017-12-29 (×10): 0.5 mg via INTRAVENOUS
  Filled 2017-12-26 (×11): qty 1

## 2017-12-26 MED ORDER — HYDROMORPHONE HCL 1 MG/ML IJ SOLN: 0.5000 mg | INTRAMUSCULAR | Status: DC | PRN

## 2017-12-26 MED ORDER — CLINDAMYCIN PHOSPHATE 900 MG/50ML IV SOLN
900.0000 mg | Freq: Once | INTRAVENOUS | Status: AC
  Administered 2017-12-26: 900 mg via INTRAVENOUS
  Filled 2017-12-26: qty 50

## 2017-12-26 MED ORDER — VANCOMYCIN HCL IN DEXTROSE 1-5 GM/200ML-% IV SOLN
1000.0000 mg | Freq: Two times a day (BID) | INTRAVENOUS | Status: AC
  Administered 2017-12-26: 1000 mg via INTRAVENOUS
  Filled 2017-12-26: qty 200

## 2017-12-26 MED ORDER — ONDANSETRON HCL 4 MG PO TABS
4.0000 mg | ORAL_TABLET | Freq: Four times a day (QID) | ORAL | Status: DC | PRN
  Administered 2017-12-31: 4 mg via ORAL
  Filled 2017-12-26: qty 1

## 2017-12-26 MED ORDER — FENTANYL CITRATE (PF) 100 MCG/2ML IJ SOLN: 25.0000 ug | INTRAMUSCULAR | Status: DC | PRN

## 2017-12-26 MED ORDER — LIDOCAINE HCL (CARDIAC) PF 100 MG/5ML IV SOSY
PREFILLED_SYRINGE | INTRAVENOUS | Status: DC | PRN
  Administered 2017-12-26: 50 mg via INTRATRACHEAL

## 2017-12-26 MED ORDER — 0.9 % SODIUM CHLORIDE (POUR BTL) OPTIME
TOPICAL | Status: DC | PRN
  Administered 2017-12-26: 1000 mL

## 2017-12-26 MED ORDER — PHENYLEPHRINE 40 MCG/ML (10ML) SYRINGE FOR IV PUSH (FOR BLOOD PRESSURE SUPPORT)
PREFILLED_SYRINGE | INTRAVENOUS | Status: DC | PRN
  Administered 2017-12-26 (×3): 80 ug via INTRAVENOUS

## 2017-12-26 MED ORDER — MIDAZOLAM HCL 2 MG/2ML IJ SOLN
1.0000 mg | Freq: Once | INTRAMUSCULAR | Status: AC
  Administered 2017-12-26: 1 mg via INTRAVENOUS

## 2017-12-26 MED ORDER — DEXAMETHASONE SODIUM PHOSPHATE 10 MG/ML IJ SOLN
INTRAMUSCULAR | Status: DC | PRN
  Administered 2017-12-26: 5 mg via INTRAVENOUS

## 2017-12-26 MED ORDER — FENTANYL CITRATE (PF) 100 MCG/2ML IJ SOLN
50.0000 ug | Freq: Once | INTRAMUSCULAR | Status: AC
  Administered 2017-12-26: 50 ug via INTRAVENOUS

## 2017-12-26 MED ORDER — MIDAZOLAM HCL 2 MG/2ML IJ SOLN
INTRAMUSCULAR | Status: AC
  Administered 2017-12-26: 1 mg via INTRAVENOUS
  Filled 2017-12-26: qty 2

## 2017-12-26 MED ORDER — METOCLOPRAMIDE HCL 5 MG/ML IJ SOLN
5.0000 mg | Freq: Three times a day (TID) | INTRAMUSCULAR | Status: DC | PRN
  Administered 2017-12-28: 10 mg via INTRAVENOUS
  Filled 2017-12-26: qty 2

## 2017-12-26 MED ORDER — FENTANYL CITRATE (PF) 100 MCG/2ML IJ SOLN
INTRAMUSCULAR | Status: AC
  Administered 2017-12-26: 50 ug via INTRAVENOUS
  Filled 2017-12-26: qty 2

## 2017-12-26 MED ORDER — ONDANSETRON HCL 4 MG/2ML IJ SOLN
4.0000 mg | Freq: Four times a day (QID) | INTRAMUSCULAR | Status: DC | PRN
  Administered 2017-12-27 – 2017-12-28 (×2): 4 mg via INTRAVENOUS
  Filled 2017-12-26 (×2): qty 2

## 2017-12-26 MED ORDER — BUPIVACAINE-EPINEPHRINE (PF) 0.5% -1:200000 IJ SOLN
INTRAMUSCULAR | Status: DC | PRN
  Administered 2017-12-26: 30 mL via PERINEURAL

## 2017-12-26 MED ORDER — DOCUSATE SODIUM 100 MG PO CAPS
100.0000 mg | ORAL_CAPSULE | Freq: Two times a day (BID) | ORAL | Status: DC
  Administered 2017-12-27 – 2018-01-01 (×11): 100 mg via ORAL
  Filled 2017-12-26 (×3): qty 1

## 2017-12-26 MED ORDER — PROPOFOL 10 MG/ML IV BOLUS
INTRAVENOUS | Status: DC | PRN
  Administered 2017-12-26: 140 mg via INTRAVENOUS

## 2017-12-26 MED ORDER — ONDANSETRON HCL 4 MG/2ML IJ SOLN
INTRAMUSCULAR | Status: DC | PRN
  Administered 2017-12-26: 4 mg via INTRAVENOUS

## 2017-12-26 MED ORDER — FENTANYL CITRATE (PF) 250 MCG/5ML IJ SOLN
INTRAMUSCULAR | Status: DC | PRN
  Administered 2017-12-26 (×2): 50 ug via INTRAVENOUS

## 2017-12-26 MED ORDER — LACTATED RINGERS IV SOLN
INTRAVENOUS | Status: DC
  Administered 2017-12-26 (×2): via INTRAVENOUS

## 2017-12-26 MED ORDER — MEPERIDINE HCL 50 MG/ML IJ SOLN: 6.2500 mg | INTRAMUSCULAR | Status: DC | PRN

## 2017-12-26 SURGICAL SUPPLY — 70 items
BANDAGE ACE 4X5 VEL STRL LF (GAUZE/BANDAGES/DRESSINGS) IMPLANT
BANDAGE ACE 6X5 VEL STRL LF (GAUZE/BANDAGES/DRESSINGS) IMPLANT
BANDAGE ELASTIC 4 VELCRO ST LF (GAUZE/BANDAGES/DRESSINGS) ×2 IMPLANT
BANDAGE ELASTIC 6 VELCRO ST LF (GAUZE/BANDAGES/DRESSINGS) ×2 IMPLANT
BANDAGE ESMARK 6X9 LF (GAUZE/BANDAGES/DRESSINGS) ×1 IMPLANT
BIT DRILL 2.9 CANN QC NONSTRL (BIT) ×2 IMPLANT
BIT DRILL CALIBRATED 2.7 (BIT) ×2 IMPLANT
BNDG ESMARK 6X9 LF (GAUZE/BANDAGES/DRESSINGS) ×2
COVER MAYO STAND STRL (DRAPES) ×2 IMPLANT
COVER SURGICAL LIGHT HANDLE (MISCELLANEOUS) ×2 IMPLANT
CUFF TOURNIQUET SINGLE 34IN LL (TOURNIQUET CUFF) ×2 IMPLANT
CUFF TOURNIQUET SINGLE 44IN (TOURNIQUET CUFF) IMPLANT
DRAPE C-ARM 42X72 X-RAY (DRAPES) ×2 IMPLANT
DRAPE HALF SHEET 40X57 (DRAPES) ×2 IMPLANT
DRAPE INCISE IOBAN 66X45 STRL (DRAPES) ×2 IMPLANT
DRAPE U-SHAPE 47X51 STRL (DRAPES) ×2 IMPLANT
DRSG PAD ABDOMINAL 8X10 ST (GAUZE/BANDAGES/DRESSINGS) ×2 IMPLANT
DURAPREP 26ML APPLICATOR (WOUND CARE) ×2 IMPLANT
ELECT CAUTERY BLADE 6.4 (BLADE) ×2 IMPLANT
ELECT REM PT RETURN 9FT ADLT (ELECTROSURGICAL) ×2
ELECTRODE REM PT RTRN 9FT ADLT (ELECTROSURGICAL) ×1 IMPLANT
GAUZE SPONGE 4X4 12PLY STRL (GAUZE/BANDAGES/DRESSINGS) ×2 IMPLANT
GAUZE XEROFORM 1X8 LF (GAUZE/BANDAGES/DRESSINGS) ×2 IMPLANT
GAUZE XEROFORM 5X9 LF (GAUZE/BANDAGES/DRESSINGS) ×2 IMPLANT
GLOVE BIOGEL PI IND STRL 6.5 (GLOVE) ×2 IMPLANT
GLOVE BIOGEL PI IND STRL 8 (GLOVE) ×2 IMPLANT
GLOVE BIOGEL PI INDICATOR 6.5 (GLOVE) ×2
GLOVE BIOGEL PI INDICATOR 8 (GLOVE) ×2
GLOVE ORTHO TXT STRL SZ7.5 (GLOVE) ×6 IMPLANT
GLOVE SURG SS PI 6.5 STRL IVOR (GLOVE) ×4 IMPLANT
GOWN STRL REUS W/ TWL LRG LVL3 (GOWN DISPOSABLE) ×3 IMPLANT
GOWN STRL REUS W/ TWL XL LVL3 (GOWN DISPOSABLE) ×1 IMPLANT
GOWN STRL REUS W/TWL 2XL LVL3 (GOWN DISPOSABLE) ×2 IMPLANT
GOWN STRL REUS W/TWL LRG LVL3 (GOWN DISPOSABLE) ×3
GOWN STRL REUS W/TWL XL LVL3 (GOWN DISPOSABLE) ×1
K-WIRE ACE 1.6X6 (WIRE) ×2
KIT BASIN OR (CUSTOM PROCEDURE TRAY) ×2 IMPLANT
KIT TURNOVER KIT B (KITS) ×2 IMPLANT
KWIRE ACE 1.6X6 (WIRE) ×1 IMPLANT
MANIFOLD NEPTUNE II (INSTRUMENTS) ×2 IMPLANT
NS IRRIG 1000ML POUR BTL (IV SOLUTION) ×2 IMPLANT
PACK ORTHO EXTREMITY (CUSTOM PROCEDURE TRAY) ×2 IMPLANT
PAD ARMBOARD 7.5X6 YLW CONV (MISCELLANEOUS) ×2 IMPLANT
PAD CAST 4YDX4 CTTN HI CHSV (CAST SUPPLIES) ×1 IMPLANT
PADDING CAST COTTON 4X4 STRL (CAST SUPPLIES) ×1
PADDING CAST COTTON 6X4 STRL (CAST SUPPLIES) ×2 IMPLANT
PLATE LOCK 8H 103 BILAT FIB (Plate) ×2 IMPLANT
SCREW ACE CAN 4.0 36M (Screw) ×2 IMPLANT
SCREW ACE CAN 4.0 40M (Screw) ×2 IMPLANT
SCREW ACE CAN 4.0 46M (Screw) ×2 IMPLANT
SCREW CORTICAL 3.5MM  28MM (Screw) ×1 IMPLANT
SCREW CORTICAL 3.5MM 28MM (Screw) ×1 IMPLANT
SCREW LOCK 3.5X10 DIST TIB (Screw) ×2 IMPLANT
SCREW LOCK CORT STAR 3.5X10 (Screw) ×4 IMPLANT
SCREW LOCK CORT STAR 3.5X12 (Screw) ×4 IMPLANT
SCREW NON LOCKING LP 3.5 14MM (Screw) ×4 IMPLANT
SPLINT FIBERGLASS 4X30 (CAST SUPPLIES) ×4 IMPLANT
SPONGE LAP 18X18 X RAY DECT (DISPOSABLE) ×2 IMPLANT
STAPLER VISISTAT 35W (STAPLE) IMPLANT
SUCTION FRAZIER HANDLE 10FR (MISCELLANEOUS) ×1
SUCTION TUBE FRAZIER 10FR DISP (MISCELLANEOUS) ×1 IMPLANT
SUT ETHILON 3 0 PS 1 (SUTURE) ×4 IMPLANT
SUT VIC AB 2-0 CT1 27 (SUTURE) ×2
SUT VIC AB 2-0 CT1 TAPERPNT 27 (SUTURE) ×2 IMPLANT
SYR CONTROL 10ML LL (SYRINGE) ×2 IMPLANT
TOWEL OR 17X24 6PK STRL BLUE (TOWEL DISPOSABLE) IMPLANT
TOWEL OR 17X26 10 PK STRL BLUE (TOWEL DISPOSABLE) ×2 IMPLANT
TUBE CONNECTING 12X1/4 (SUCTIONS) ×2 IMPLANT
WATER STERILE IRR 1000ML POUR (IV SOLUTION) IMPLANT
YANKAUER SUCT BULB TIP NO VENT (SUCTIONS) ×2 IMPLANT

## 2017-12-26 NOTE — Interval H&P Note (Signed)
History and Physical Interval Note:  12/26/2017 12:38 PM  [name]  has presented today for surgery, with the diagnosis of Right Closed Bimalleolar Ankle Fracture, Syndesmotic Rutpure  The various methods of treatment have been discussed with the patient and family. After consideration of risks, benefits and other options for treatment, the patient has consented to  Procedure(s): OPEN REDUCTION INTERNAL FIXATION (ORIF) RIGHT BIMALLEOLAR ANKLE FRACTURE (Right) as a surgical intervention .  The patient's history has been reviewed, patient examined, no change in status, stable for surgery.  I have reviewed the patient's chart and labs.  Questions were answered to the patient's satisfaction.     Maria [name]

## 2017-12-26 NOTE — Brief Op Note (Signed)
12/26/2017  3:06 PM  PATIENT:  Maria [name]  72 y.o. female  PRE-OPERATIVE DIAGNOSIS:  Right Closed Bimalleolar Ankle Fracture, Syndesmotic Rutpure  POST-OPERATIVE DIAGNOSIS:  Right Closed Bimalleolar Ankle Fracture, Syndesmotic Rutpure  PROCEDURE:  Procedure(s): OPEN REDUCTION INTERNAL FIXATION (ORIF) RIGHT BIMALLEOLAR ANKLE FRACTURE (Right)  SURGEON:  Surgeon(s) and Role:    * Marybelle Killings, MD - Primary  PHYSICIAN ASSISTANT: Benjiman Core pa-c   ANESTHESIA:   general  EBL:  5 mL   BLOOD ADMINISTERED:none  DRAINS: none    SPECIMEN:  No Specimen  DISPOSITION OF SPECIMEN:  N/A  COUNTS:  YES  TOURNIQUET:   Total Tourniquet Time Documented: Thigh (Right) - 40 minutes Total: Thigh (Right) - 40 minutes   DICTATION: .Dragon Dictation   PATIENT DISPOSITION:  PACU - hemodynamically stable.

## 2017-12-26 NOTE — Telephone Encounter (Signed)
Discussed.

## 2017-12-26 NOTE — Transfer of Care (Signed)
Immediate Anesthesia Transfer of Care Note  Patient: [facility]  Procedure(s) Performed: OPEN REDUCTION INTERNAL FIXATION (ORIF) RIGHT BIMALLEOLAR ANKLE FRACTURE (Right Ankle)  Patient Location: PACU  Anesthesia Type:GA combined with regional for post-op pain  Level of Consciousness: awake, alert  and oriented  Airway & Oxygen Therapy: Patient Spontanous Breathing and Patient connected to face mask oxygen  Post-op Assessment: Report given to RN and Post -op Vital signs reviewed and stable  Post vital signs: Reviewed and stable  Last Vitals:  Vitals Value Taken Time  BP 135/55 12/26/2017  3:37 PM  Temp 36.8 C 12/26/2017  3:36 PM  Pulse 99 12/26/2017  3:38 PM  Resp 18 12/26/2017  3:38 PM  SpO2 92 % 12/26/2017  3:38 PM  Vitals shown include unvalidated device data.  Last Pain:  Vitals:   12/26/17 0830  TempSrc:   PainSc: 6          Complications: No apparent anesthesia complications

## 2017-12-26 NOTE — Anesthesia Procedure Notes (Signed)
Anesthesia Regional Block: Adductor canal block   Pre-Anesthetic Checklist: ,, timeout performed, Correct Patient, Correct Site, Correct Laterality, Correct Procedure, Correct Position, site marked, Risks and benefits discussed,  Surgical consent,  Pre-op evaluation,  At surgeon's request and post-op pain management  Laterality: Right  Prep: chloraprep       Needles:  Injection technique: Single-shot  Needle Type: Echogenic Needle     Needle Length: 9cm  Needle Gauge: 21     Additional Needles:   Procedures:,,,, ultrasound used (permanent image in chart),,,,  Narrative:  Start time: 12/26/2017 1:10 PM End time: 12/26/2017 1:20 PM Injection made incrementally with aspirations every 5 mL.  Performed by: Personally  Anesthesiologist: Effie Berkshire, MD  Additional Notes: Patient tolerated the procedure well. Local anesthetic introduced in an incremental fashion under minimal resistance after negative aspirations. No paresthesias were elicited. After completion of the procedure, no acute issues were identified and patient continued to be monitored by RN.

## 2017-12-26 NOTE — Anesthesia Postprocedure Evaluation (Signed)
Anesthesia Post Note  Patient: Maria [name]  Procedure(s) Performed: OPEN REDUCTION INTERNAL FIXATION (ORIF) RIGHT BIMALLEOLAR ANKLE FRACTURE (Right Ankle)     Patient location during evaluation: PACU Anesthesia Type: General Level of consciousness: awake and alert Pain management: pain level controlled Vital Signs Assessment: post-procedure vital signs reviewed and stable Respiratory status: spontaneous breathing, nonlabored ventilation, respiratory function stable and patient connected to nasal cannula oxygen Cardiovascular status: blood pressure returned to baseline and stable Postop Assessment: no apparent nausea or vomiting Anesthetic complications: no    Last Vitals:  Vitals:   12/26/17 1551 12/26/17 1552  BP: (!) 149/76   Pulse: (!) 103 (!) 101  Resp: (!) 21 20  Temp:    SpO2: 93% 97%    Last Pain:  Vitals:   12/26/17 1536  TempSrc:   PainSc: 0-No pain                 Barnet Glasgow

## 2017-12-26 NOTE — Anesthesia Procedure Notes (Signed)
Anesthesia Regional Block: Popliteal block   Pre-Anesthetic Checklist: ,, timeout performed, Correct Patient, Correct Site, Correct Laterality, Correct Procedure, Correct Position, site marked, Risks and benefits discussed,  Surgical consent,  Pre-op evaluation,  At surgeon's request and post-op pain management  Laterality: Right  Prep: chloraprep       Needles:  Injection technique: Single-shot  Needle Type: Echogenic Needle     Needle Length: 9cm  Needle Gauge: 21     Additional Needles:   Procedures:,,,, ultrasound used (permanent image in chart),,,,  Narrative:  Start time: 12/26/2017 1:00 PM End time: 12/26/2017 1:10 PM Injection made incrementally with aspirations every 5 mL.  Performed by: Personally  Anesthesiologist: Effie Berkshire, MD  Additional Notes: Patient tolerated the procedure well. Local anesthetic introduced in an incremental fashion under minimal resistance after negative aspirations. No paresthesias were elicited. After completion of the procedure, no acute issues were identified and patient continued to be monitored by RN.

## 2017-12-26 NOTE — Anesthesia Procedure Notes (Signed)
Procedure Name: LMA Insertion Date/Time: 12/26/2017 1:33 PM Performed by: Julieta Bellini, CRNA Pre-anesthesia Checklist: Patient identified, Emergency Drugs available, Suction available and Patient being monitored Patient Re-evaluated:Patient Re-evaluated prior to induction Oxygen Delivery Method: Circle system utilized Preoxygenation: Pre-oxygenation with 100% oxygen Induction Type: IV induction LMA: LMA inserted LMA Size: 4.0 Number of attempts: 1 Placement Confirmation: positive ETCO2 and breath sounds checked- equal and bilateral Tube secured with: Tape

## 2017-12-26 NOTE — Progress Notes (Signed)
Pt returned to room 5N08 after surgery. Pt currently on 2L nasal cannula with O2 in mid 90's with no signs of respiratory distress at this time. Pt currently tachy around 100, but asymptomatic. Sacral foam dressing in place to lower back/buttocks. Received report from Verdis Frederickson, RN from PACU. Will continue to monitor.

## 2017-12-26 NOTE — Anesthesia Preprocedure Evaluation (Addendum)
Anesthesia Evaluation  Patient identified by MRN, date of birth, ID band Patient awake    Reviewed: Allergy & Precautions, H&P , NPO status , Patient's Chart, lab work & pertinent test results, reviewed documented beta blocker date and time   Airway Mallampati: I  TM Distance: >3 FB Neck ROM: Full    Dental  (+) Edentulous Upper, Edentulous Lower   Pulmonary neg pulmonary ROS, Current Smoker,    + rhonchi  + decreased breath sounds      Cardiovascular Exercise Tolerance: Good hypertension, Pt. on medications negative cardio ROS   Rhythm:Regular Rate:Normal  EKG 4/19 Sinus rhythm with occasional Premature ventricular complexes Left bundle branch block   Neuro/Psych PSYCHIATRIC DISORDERS Bipolar Disorder negative neurological ROS     GI/Hepatic negative GI ROS, Neg liver ROS,   Endo/Other  negative endocrine ROS  Renal/GU negative Renal ROS  negative genitourinary   Musculoskeletal  (+) Arthritis ,   Abdominal Normal abdominal exam  (+)   Peds  Hematology negative hematology ROS (+)   Anesthesia Other Findings   Reproductive/Obstetrics negative OB ROS                          Anesthesia Physical Anesthesia Plan  ASA: III  Anesthesia Plan: General   Post-op Pain Management:  Regional for Post-op pain and GA combined w/ Regional for post-op pain   Induction: Intravenous  PONV Risk Score and Plan: 2 and Ondansetron, Treatment may vary due to age or medical condition, Dexamethasone and Midazolam  Airway Management Planned: LMA  Additional Equipment: None  Intra-op Plan:   Post-operative Plan: Extubation in OR  Informed Consent: I have reviewed the patients History and Physical, chart, labs and discussed the procedure including the risks, benefits and alternatives for the proposed anesthesia with the patient or authorized representative who has indicated his/her understanding and  acceptance.   Dental Advisory Given  Plan Discussed with: CRNA  Anesthesia Plan Comments:      Anesthesia Quick Evaluation

## 2017-12-26 NOTE — Op Note (Signed)
Preop diagnosis: Right closed displaced bimalleolar  ankle fracture  Postop diagnosis: Same  Procedure: ORIF right bimalleolar ankle fracture with fixation of lateral and posterior malleolus.  Surgeon: Rodell Perna MD  Assistant: Benjiman Core PA-C medically necessary and present for the entire procedure.  Anesthesia preoperative block plus general.  Tourniquet time less than 45 minutes  Implants: Biomet 8 hole composite fibular plate with interfrag lag screw.  Lag screws for posterior malleolus for cannulated screws.  Procedure prepping and draping with preoperative antibiotics given timeout procedure patient had had vancomycin ordered however it did not been started in preoperative holding so clindamycin was given some vancomycin got him before the tourniquet was inflated.  The rest of the vancomycin was given after tourniquet was deflated.  Had severe reaction with penicillin requiring hospitalization.  Lateral skin marker was used leg was elevated to Esmarch tourniquet used after standard prepping and draping with bone foam used.  Lateral incision was made over the subcutaneous palpable border of the fibula subperiosteal dissection fracture reduction and 8 hole distal lateral tibial surface taking extensor tendons toward the medial aspect.  Under direct visualization of the bone K wire was drilled using a guide checked under fluoroscopy measurement and a lag screw was placed from anterior to posterior leg and the posterior fragment compressing it.  Posterior fragment which was the posterior malleolus had been well reduced with the fibula being taken out to length and brought distal fragment in anterior in line with the proximal portion of the fibula.  Syndesmosis was intact and medial clear space was reduced.  Measurement and placement of a unicortical partially threaded lag screw catching the posterior cortex compressing it tightly.  A second screw was placed slightly more proximal and angled toward  the midline of the tibia catching the posterior cortex as well with good fixation.  Both screws were bicortical.  AP lateral mortise view showed anatomic reduction of the ankle joint.  Irrigation tourniquet deflation hemostasis in standard layer closure 2-0 Vicryl subtendinous tissue skin staple closure and short leg splint was applied.  Patient tolerated the procedure well.

## 2017-12-27 ENCOUNTER — Encounter (HOSPITAL_COMMUNITY): Payer: Self-pay | Admitting: Orthopaedic Surgery

## 2017-12-27 LAB — BASIC METABOLIC PANEL
ANION GAP: 8 (ref 5–15)
BUN: 20 mg/dL (ref 6–20)
CHLORIDE: 102 mmol/L (ref 101–111)
CO2: 27 mmol/L (ref 22–32)
Calcium: 8.7 mg/dL — ABNORMAL LOW (ref 8.9–10.3)
Creatinine, Ser: 0.79 mg/dL (ref 0.44–1.00)
Glucose, Bld: 138 mg/dL — ABNORMAL HIGH (ref 65–99)
POTASSIUM: 4.7 mmol/L (ref 3.5–5.1)
SODIUM: 137 mmol/L (ref 135–145)

## 2017-12-27 MED ORDER — HYDROCODONE-ACETAMINOPHEN 5-325 MG PO TABS: 1.0000 | ORAL_TABLET | Freq: Four times a day (QID) | ORAL | 0 refills | Status: DC | PRN

## 2017-12-27 MED ORDER — ASPIRIN EC 325 MG PO TBEC: 325.0000 mg | DELAYED_RELEASE_TABLET | Freq: Every day | ORAL | 0 refills | Status: DC

## 2017-12-27 NOTE — Discharge Instructions (Signed)
ORTHOPEDIC DISCHARGE INSTRUCTIONS  -Do not remove splint/dressing or get wet  -Strict nonweightbearing right lower extremity.  -Elevate right foot above heart level as much as possible to help decrease pain and swelling.  -If there are any questions contact our office immediately at [phone] to speak with Dr. Lorin Mercy

## 2017-12-27 NOTE — Social Work (Signed)
CSW met with patient at bedside to discuss SNF offers. Pt requested time to discuss list of offers with significant other.  CSW will f/u with her in the morning as patient will need Insurance Auth for placement. CSW reiterated again the importance of selecting a SNF soon so that the SNF can obtain Insurance. Pt agreed to same and still needing more time to discuss with partner.  CSW will f/u.  Elissa Hefty, LCSW Clinical Social Worker [phone]

## 2017-12-27 NOTE — NC FL2 (Signed)
[place] MEDICAID FL2 LEVEL OF CARE SCREENING TOOL     IDENTIFICATION  Patient Name: Maria [name] Birthdate: May 22, 1946 Sex: female Admission Date (Current Location): 12/25/2017  [facility] and [place] Number:  Herbalist and Address:  [facility] [place]. [facility], [place] [address], [place], [place] [address]      Provider Number: [id number]  Attending Physician Name and Address:  Marybelle Killings, MD  Relative Name and Phone Number:  Scherrie November, partner, [phone]    Current Level of Care: Hospital Recommended Level of Care: [place] Prior Approval Number:    Date Approved/Denied:   PASRR Number: [id number]  Discharge Plan: SNF    Current Diagnoses: Patient Active Problem List   Diagnosis Date Noted  . Closed right ankle fracture 12/25/2017    Orientation RESPIRATION BLADDER Height & Weight     Self, Time, Situation, Place  Normal Incontinent Weight: 182 lb (82.6 kg) Height:  5\' 7"  (170.2 cm)  BEHAVIORAL SYMPTOMS/MOOD NEUROLOGICAL BOWEL NUTRITION STATUS      Continent Diet(See DC Summary)  AMBULATORY STATUS COMMUNICATION OF NEEDS Skin   Limited Assist Verbally Surgical wounds                       Personal Care Assistance Level of Assistance  Dressing, Bathing, Feeding Bathing Assistance: Limited assistance Feeding assistance: Limited assistance Dressing Assistance: Limited assistance     Functional Limitations Info  Sight, Hearing, Speech Sight Info: Adequate Hearing Info: Adequate Speech Info: Adequate    SPECIAL CARE FACTORS FREQUENCY  PT (By licensed PT), OT (By licensed OT)     PT Frequency: 5x week OT Frequency: 5x week            Contractures      Additional Factors Info  Code Status, Allergies Code Status Info: Full Allergies Info: Penicillins           Current Medications (12/27/2017):  This is the current hospital active medication list Current Facility-Administered Medications   Medication Dose Route Frequency Provider Last Rate Last Dose  . docusate sodium (COLACE) capsule 100 mg  100 mg Oral BID Lanae Crumbly, PA-C   100 mg at 12/27/17 8563  . docusate sodium (COLACE) capsule 100 mg  100 mg Oral BID Lanae Crumbly, PA-C      . HYDROcodone-acetaminophen (NORCO/VICODIN) 5-325 MG per tablet 1-2 tablet  1-2 tablet Oral Q6H PRN Lanae Crumbly, PA-C   2 tablet at 12/27/17 1497  . HYDROmorphone (DILAUDID) injection 0.5 mg  0.5 mg Intravenous Q3H PRN Marybelle Killings, MD   0.5 mg at 12/27/17 1052  . lactated ringers infusion   Intravenous Continuous Janeece Riggers, MD 10 mL/hr at 12/26/17 1646    . metoCLOPramide (REGLAN) tablet 5-10 mg  5-10 mg Oral Q8H PRN Lanae Crumbly, PA-C       Or  . metoCLOPramide (REGLAN) injection 5-10 mg  5-10 mg Intravenous Q8H PRN Benjiman Core M, PA-C      . nicotine (NICODERM CQ - dosed in mg/24 hours) patch 14 mg  14 mg Transdermal Daily Marybelle Killings, MD   14 mg at 12/27/17 0959  . ondansetron (ZOFRAN) tablet 4 mg  4 mg Oral Q6H PRN Lanae Crumbly, PA-C       Or  . ondansetron [facility]) injection 4 mg  4 mg Intravenous Q6H PRN Lanae Crumbly, PA-C   4 mg at 12/27/17 1031  . polyethylene glycol (MIRALAX /  GLYCOLAX) packet 17 g  17 g Oral Daily PRN Lanae Crumbly, PA-C         Discharge Medications: Please see discharge summary for a list of discharge medications.  Relevant Imaging Results:  Relevant Lab Results:   Additional Information SS#: [place], LCSW

## 2017-12-27 NOTE — Progress Notes (Signed)
Subjective: 1 Day Post-Op Procedure(s) (LRB): OPEN REDUCTION INTERNAL FIXATION (ORIF) RIGHT BIMALLEOLAR ANKLE FRACTURE (Right) Patient reports pain as mild.    Objective: Vital signs in last 24 hours: Temp:  [96.8 F (36 C)-98.6 F (37 C)] 97.7 F (36.5 C) (04/18 0501) Pulse Rate:  [92-110] 92 (04/18 0501) Resp:  [14-29] 24 (04/17 1748) BP: (110-173)/(54-90) 115/54 (04/18 0501) SpO2:  [83 %-97 %] 96 % (04/18 0545) Weight:  [182 lb (82.6 kg)] 182 lb (82.6 kg) (04/17 1242)  Intake/Output from previous day: 04/17 0701 - 04/18 0700 In: 7000 [I.V.:7000] Out: 1005 [Urine:1000; Blood:5] Intake/Output this shift: No intake/output data recorded.  Recent Labs    12/25/17 1133  HGB 12.8   Recent Labs    12/25/17 1133  WBC 8.8  RBC 4.72  HCT 40.3  PLT 256   Recent Labs    12/25/17 1133 12/27/17 0614  NA 139 137  K 4.1 4.7  CL 105 102  CO2 22 27  BUN 30* 20  CREATININE 0.95 0.79  GLUCOSE 86 138*  CALCIUM 8.7* 8.7*   No results for input(s): LABPT, INR in the last 72 hours.  splint intact. Dg Ankle Complete Right  Result Date: 12/26/2017 CLINICAL DATA:  Intraoperative imaging for fixation of distal fibular and tibial fractures which the patient suffered in a fall 12/18/2017. Initial encounter. EXAM: DG C-ARM 61-120 MIN; RIGHT ANKLE - COMPLETE 3+ VIEW COMPARISON:  CT and plain films right ankle 12/18/2017. FINDINGS: Five fluoroscopic intraoperative spot views of the ankle are provided. Images demonstrate lateral plate and screws and an interfragmentary screw in the distal fibula for fracture fixation. Two anterior approach screws fix a posterior malleolar fracture. Position alignment appear anatomic. No acute abnormality. IMPRESSION: Intraoperative imaging for fixation of posterior and lateral malleolar fractures. No acute finding. Electronically Signed   By: Inge Rise M.D.   On: 12/26/2017 14:41   Dg C-arm 1-60 Min  Result Date: 12/26/2017 CLINICAL DATA:   Intraoperative imaging for fixation of distal fibular and tibial fractures which the patient suffered in a fall 12/18/2017. Initial encounter. EXAM: DG C-ARM 61-120 MIN; RIGHT ANKLE - COMPLETE 3+ VIEW COMPARISON:  CT and plain films right ankle 12/18/2017. FINDINGS: Five fluoroscopic intraoperative spot views of the ankle are provided. Images demonstrate lateral plate and screws and an interfragmentary screw in the distal fibula for fracture fixation. Two anterior approach screws fix a posterior malleolar fracture. Position alignment appear anatomic. No acute abnormality. IMPRESSION: Intraoperative imaging for fixation of posterior and lateral malleolar fractures. No acute finding. Electronically Signed   By: Inge Rise M.D.   On: 12/26/2017 14:41    Assessment/Plan: 1 Day Post-Op Procedure(s) (LRB): OPEN REDUCTION INTERNAL FIXATION (ORIF) RIGHT BIMALLEOLAR ANKLE FRACTURE (Right) Up with therapy   She was suppose to have equipment delivered to her house yesterday. It has been several days delayed in getting to her house. She likely need SNF placement , if able to transfer to W/C with removable arms, can get to bathroom NWB with walker then she might be able to go home, ( previously was at home , stuck in a recliner and could not get up for 3 days, voiding into chair etc. ) lives with husband. No vehicle, they walk to store and use bikes /city bus for longer transportation.  When she finally got into chair after 3 days was FWB which she cannot do.  SNF likely best place for her.   Marybelle Killings 12/27/2017, 8:01 AM

## 2017-12-27 NOTE — Discharge Summary (Addendum)
Patient ID: Maria [name] MRN: [id number] DOB/AGE: 10/06/1945 72 y.o.  Admit date: 12/25/2017 Discharge date: 12/27/2017  Admission Diagnoses:  Active Problems:   Closed right ankle fracture   Discharge Diagnoses:  Active Problems:   Closed right ankle fracture  status post Procedure(s): OPEN REDUCTION INTERNAL FIXATION (ORIF) RIGHT BIMALLEOLAR ANKLE FRACTURE  Past Medical History:  Diagnosis Date  . Arthritis   . Bipolar disorder ([place])   . Fracture, ankle 12/25/2017   closed right ankle fracture  . History of kidney stones   . Hypertension     Surgeries: Procedure(s): OPEN REDUCTION INTERNAL FIXATION (ORIF) RIGHT BIMALLEOLAR ANKLE FRACTURE on 12/26/2017   Consultants:   Discharged Condition: Improved  Hospital Course: Deborah Lazcano is an 72 y.o. female who was admitted 12/25/2017 for operative treatment of right bimalleolar ankle fracture. Patient failed conservative treatments (please see the history and physical for the specifics) and had severe unremitting pain that affects sleep, daily activities and work/hobbies. After pre-op clearance, the patient was taken to the operating room on 12/26/2017 and underwent  Procedure(s): OPEN REDUCTION INTERNAL FIXATION (ORIF) RIGHT BIMALLEOLAR ANKLE FRACTURE.    Patient was given perioperative antibiotics:  Anti-infectives (From admission, onward)   Start     Dose/Rate Route Frequency Ordered Stop   12/26/17 1630  vancomycin (VANCOCIN) IVPB 1000 mg/200 mL premix     1,000 mg 200 mL/hr over 60 Minutes Intravenous Every 12 hours 12/26/17 1617 12/26/17 1848   12/26/17 1345  clindamycin (CLEOCIN) IVPB 900 mg     900 mg 100 mL/hr over 30 Minutes Intravenous  Once 12/26/17 1339 12/26/17 1407   12/25/17 1100  vancomycin (VANCOCIN) IVPB 1000 mg/200 mL premix     1,000 mg 200 mL/hr over 60 Minutes Intravenous On call to O.R. 12/25/17 1047 12/26/17 0559       Patient was given sequential compression devices and early ambulation to  prevent DVT.   Patient benefited maximally from hospital stay and there were no complications. At the time of discharge, the patient was urinating/moving their bowels without difficulty, tolerating a regular diet, pain is controlled with oral pain medications and they have been cleared by PT/OT.   Recent vital signs:  Patient Vitals for the past 24 hrs:  BP Temp Temp src Pulse Resp SpO2  12/27/17 0545 - - - - - 96 %  12/27/17 0501 (!) 115/54 97.7 F (36.5 C) Oral 92 - (!) 83 %  12/27/17 0028 129/76 98.6 F (37 C) Oral (!) 104 - 90 %  12/26/17 2010 (!) 148/87 98.2 F (36.8 C) Oral 100 - 94 %  12/26/17 1844 (!) 151/68 - - (!) 103 - -  12/26/17 1748 (!) 152/82 98.4 F (36.9 C) Oral (!) 110 (!) 24 94 %  12/26/17 1640 (!) 147/68 (!) 97.4 F (36.3 C) Oral (!) 103 (!) 25 96 %  12/26/17 1615 - (!) 96.8 F (36 C) - (!) 105 (!) 29 95 %  12/26/17 1600 (!) 156/69 - - (!) 106 (!) 22 94 %  12/26/17 1552 - - - (!) 101 20 97 %  12/26/17 1551 (!) 149/76 - - (!) 103 (!) 21 93 %  12/26/17 1550 - - - (!) 101 16 93 %  12/26/17 1545 - - - (!) 102 (!) 25 92 %  12/26/17 1536 (!) 135/55 98.2 F (36.8 C) - 95 15 92 %  12/26/17 1320 110/90 - - (!) 105 16 96 %     Recent laboratory studies:  Recent Labs  12/25/17 1133 12/27/17 0614  WBC 8.8  --   HGB 12.8  --   HCT 40.3  --   PLT 256  --   NA 139 137  K 4.1 4.7  CL 105 102  CO2 22 27  BUN 30* 20  CREATININE 0.95 0.79  GLUCOSE 86 138*  CALCIUM 8.7* 8.7*     Discharge Medications:   Allergies as of 12/27/2017      Reactions   Penicillins Other (See Comments)   Has patient had a PCN reaction causing immediate rash, facial/tongue/throat swelling, SOB or lightheadedness with hypotension: Yes Has patient had a PCN reaction causing severe rash involving mucus membranes or skin necrosis: Yes Has patient had a PCN reaction that required hospitalization Yes Has patient had a PCN reaction occurring within the last 10 years: No If all of the  above answers are "NO", then may proceed with Cephalosporin use.      Medication List    STOP taking these medications   atorvastatin 20 MG tablet Commonly known as:  LIPITOR   divalproex 250 MG DR tablet Commonly known as:  DEPAKOTE   gabapentin 100 MG capsule Commonly known as:  NEURONTIN   ibuprofen 200 MG tablet Commonly known as:  ADVIL,MOTRIN   mirtazapine 45 MG tablet Commonly known as:  REMERON   traMADol 50 MG tablet Commonly known as:  ULTRAM     TAKE these medications   amitriptyline 25 MG tablet Commonly known as:  ELAVIL Take 25 mg by mouth at bedtime.   aspirin EC 325 MG tablet Take 1 tablet (325 mg total) by mouth daily.   clonazePAM 1 MG tablet Commonly known as:  KLONOPIN Take 1 mg by mouth 2 (two) times daily as needed for anxiety.   HYDROcodone-acetaminophen 5-325 MG tablet Commonly known as:  NORCO/VICODIN Take 1-2 tablets by mouth every 6 (six) hours as needed for moderate pain. What changed:    how much to take  reasons to take this   lisinopril 10 MG tablet Commonly known as:  PRINIVIL,ZESTRIL Take 10 mg by mouth daily.   QUEtiapine 200 MG tablet Commonly known as:  SEROQUEL Take 1 tablet (200 mg total) by mouth 2 (two) times daily.       Diagnostic Studies: Dg Chest 2 View  Result Date: 12/25/2017 CLINICAL DATA:  Preop testing EXAM: CHEST - 2 VIEW COMPARISON:  12/18/2017 FINDINGS: The heart size and mediastinal contours are within normal limits. Both lungs are clear. The visualized skeletal structures are unremarkable. Spinal stimulator is again noted. IMPRESSION: No active cardiopulmonary disease. Electronically Signed   By: Kathreen Devoid   On: 12/25/2017 13:33   Dg Chest 2 View  Result Date: 12/18/2017 CLINICAL DATA:  72 year old female status post fall.  Dizziness. EXAM: CHEST - 2 VIEW COMPARISON:  None. FINDINGS: Semi upright AP and lateral views of the chest. Mild to moderate cardiomegaly. Other mediastinal contours are within  normal limits. Visualized tracheal air column is within normal limits. Mild elevation of the left hemidiaphragm. No pneumothorax, pulmonary edema, pleural effusion or confluent pulmonary opacity. Lower thoracic spinal stimulator device in place. No acute osseous abnormality identified. Negative visible bowel gas pattern. Central upper abdomen surgical clips are noted. IMPRESSION: No acute cardiopulmonary abnormality. Mild-to-moderate cardiomegaly. Electronically Signed   By: Genevie Ann M.D.   On: 12/18/2017 17:36   Dg Ankle Complete Right  Result Date: 12/26/2017 CLINICAL DATA:  Intraoperative imaging for fixation of distal fibular and tibial fractures which the patient  suffered in a fall 12/18/2017. Initial encounter. EXAM: DG C-ARM 61-120 MIN; RIGHT ANKLE - COMPLETE 3+ VIEW COMPARISON:  CT and plain films right ankle 12/18/2017. FINDINGS: Five fluoroscopic intraoperative spot views of the ankle are provided. Images demonstrate lateral plate and screws and an interfragmentary screw in the distal fibula for fracture fixation. Two anterior approach screws fix a posterior malleolar fracture. Position alignment appear anatomic. No acute abnormality. IMPRESSION: Intraoperative imaging for fixation of posterior and lateral malleolar fractures. No acute finding. Electronically Signed   By: Inge Rise M.D.   On: 12/26/2017 14:41   Dg Ankle Complete Right  Result Date: 12/18/2017 CLINICAL DATA:  Fall with right ankle pain EXAM: RIGHT ANKLE - COMPLETE 3+ VIEW COMPARISON:  None. FINDINGS: There are comminuted fractures of the distal right tibia and fibula. The lateral malleolus is displaced laterally. Moderate soft tissue swelling. IMPRESSION: Comminuted fractures of the distal right tibia and fibula. Electronically Signed   By: Ulyses Jarred M.D.   On: 12/18/2017 17:26   Ct Head Wo Contrast  Result Date: 12/18/2017 CLINICAL DATA:  72 year old female status post fall.  Dizziness. EXAM: CT HEAD WITHOUT CONTRAST  TECHNIQUE: Contiguous axial images were obtained from the base of the skull through the vertex without intravenous contrast. COMPARISON:  None. FINDINGS: Brain: Cerebral volume is within normal limits for age. 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 No midline shift, ventriculomegaly, mass effect, evidence of mass lesion, intracranial hemorrhage or evidence of cortically based acute infarction. Gray-white matter differentiation is within normal limits throughout the brain. No cortical encephalomalacia identified. Vascular: Calcified atherosclerosis at the skull base. No suspicious intracranial vascular hyperdensity. Skull: No acute osseous abnormality identified. Sinuses/Orbits: Visualized paranasal sinuses and mastoids are stable and well pneumatized. Other: Postoperative changes to both globes, otherwise negative orbits soft tissues. Visualized scalp soft tissues are within normal limits. IMPRESSION: Normal for age non contrast CT appearance of the brain. Electronically Signed   By: Genevie Ann M.D.   On: 12/18/2017 17:42   Ct Ankle Right Wo Contrast  Result Date: 12/18/2017 CLINICAL DATA:  The patient suffered a fall today due to dizziness resulting in a right ankle fracture. Initial encounter. EXAM: CT OF THE RIGHT ANKLE WITHOUT CONTRAST TECHNIQUE: Multidetector CT imaging of the right ankle was performed according to the standard protocol. Multiplanar CT image reconstructions were also generated. COMPARISON:  Plain films right ankle this same day. FINDINGS: Bones/Joint/Cartilage As seen on the comparison plain films, the patient has an acute fracture of the posterior malleolus. The fracture is oblique in orientation extending from the cortex of the posterior diaphysis approximately 3.5 cm above the plafond in an inferior and anterior orientation through the mid plafond at its lateral periphery. The fracture fragment may measures 3.5 cm craniocaudal by 3.1 cm  transverse by 1.6 cm AP at its lateral margin. The fracture fragment has a triangular configuration at the plafond with the base of the triangle adjacent to the fibula. Gap in the articular surface of the plafond measures 0.7 cm at its lateral margin. The fracture fragment is impacted approximately 0.3 cm. The patient also has a fracture of the distal fibula which is oblique in orientation extending from the posterior cortex approximately 3 cm above the tip of the lateral malleolus in an anterior and inferior orientation exiting the anterior cortex approximately 2 cm above the lateral malleolus. The distal fracture fragment is posteriorly displaced 1 cm with fragment override of approximately 0.5 cm. No other fracture  is identified. The syndesmosis is not widened. No osteochondral lesion of the talar dome. Ligaments Suboptimally assessed by CT. Muscles and Tendons No tendon entrapment is seen. Soft tissues There is soft tissue contusion and hematoma about the ankle. IMPRESSION: Acute posterior and lateral malleolar fractures as described above. Electronically Signed   By: Inge Rise M.D.   On: 12/18/2017 21:14   Dg Knee Complete 4 Views Right  Result Date: 12/18/2017 CLINICAL DATA:  Right knee pain since a fall approximately 2 hours ago. Initial encounter. EXAM: RIGHT KNEE - COMPLETE 4+ VIEW COMPARISON:  None. FINDINGS: No evidence of fracture, dislocation, or joint effusion. No evidence of arthropathy or other focal bone abnormality. Soft tissues are unremarkable. IMPRESSION: Negative exam. Electronically Signed   By: Inge Rise M.D.   On: 12/18/2017 17:24   Dg C-arm 1-60 Min  Result Date: 12/26/2017 CLINICAL DATA:  Intraoperative imaging for fixation of distal fibular and tibial fractures which the patient suffered in a fall 12/18/2017. Initial encounter. EXAM: DG C-ARM 61-120 MIN; RIGHT ANKLE - COMPLETE 3+ VIEW COMPARISON:  CT and plain films right ankle 12/18/2017. FINDINGS: Five fluoroscopic  intraoperative spot views of the ankle are provided. Images demonstrate lateral plate and screws and an interfragmentary screw in the distal fibula for fracture fixation. Two anterior approach screws fix a posterior malleolar fracture. Position alignment appear anatomic. No acute abnormality. IMPRESSION: Intraoperative imaging for fixation of posterior and lateral malleolar fractures. No acute finding. Electronically Signed   By: Inge Rise M.D.   On: 12/26/2017 14:41      Follow-up Information    Marybelle Killings, MD. Schedule an appointment as soon as possible for a visit.   Specialty:  Orthopedic Surgery Why:  need return office visit one week postop with Dr Verlene Mayer information: [place] [phone]            Discharge Plan:  discharge to SNF  Disposition:     Signed: Benjiman Core for Dr Rodell Perna [facility] orthopedics 12/27/2017, 1:19 PM  Discharge update 12/31/2017.  Patient continue to work with physical therapy and can only transfer 2 steps to a chair and is unsafe for ambulation and remains a fall risk.  Husband has paranoid schizophrenia called her multiple times every 15 minutes all night during the weekend on Saturday.  She is concerned is not taking his medication.  He would not be able to get her up off the floor when she fell.  Before her surgery she was at home for 3 days and recliner unable to have a bowel movement and unable to make it to the bathroom.  She is not safe to go home and will require skilled nursing facility.  Office follow-up with me in 1 week.  She remains nonweightbearing and due to upper extremity weakness has extreme limited mobility.  Patient has bipolar disorder with anxiety.  Tearful and crying during the weekend and I will talk with her several times concerning outlined plan.  Today she is cheerful and optimistic for her recovery. SNF pending.

## 2017-12-27 NOTE — Evaluation (Signed)
Physical Therapy Evaluation Patient Details Name: Maria [name] MRN: [id number] DOB: 18-Apr-1946 Today's Date: 12/27/2017   History of Present Illness  72 y.o. female s/p R Ankle ORIF 12/26/17. PMH includes: HTN, Spinal Fusion.   Clinical Impression  Patient is s/p above surgery resulting in functional limitations due to the deficits listed below (see PT Problem List). PTA living with partner in level home with 1 stair to enter, independent with mobility. Upon eval pt presents with post op pain and weakness, imbalance due to NWB status with reliance on RW and physical assistance with standing dynamic mobility. Min A for transfers to bedside chair.  Pt presents as fall risk and would benefit from short term SNF stay to improve functional mobility while adhering to NWB.  Patient will benefit from skilled PT to increase their independence and safety with mobility to allow discharge to the venue listed below.       Follow Up Recommendations SNF;Supervision/Assistance - 24 hour;Follow surgeon's recommendation for DC plan and follow-up therapies    Equipment Recommendations  (TBD in next venue)    Recommendations for Other Services       Precautions / Restrictions Precautions Precautions: Fall Restrictions Weight Bearing Restrictions: Yes RLE Weight Bearing: Non weight bearing      Mobility  Bed Mobility Overal bed mobility: Modified Independent                Transfers Overall transfer level: Needs assistance Equipment used: Rolling walker (2 wheeled) Transfers: Sit to/from [facility] Sit to Stand: Min assist Stand pivot transfers: Min assist       General transfer comment: Min A to power up into RW and stabilize. cues for hand placement and sequencing, NWB status to chair.   Ambulation/Gait             General Gait Details: unable  Stairs            Wheelchair Mobility    Modified Rankin (Stroke Patients Only)       Balance Overall  balance assessment: History of Falls;Needs assistance   Sitting balance-Leahy Scale: Good       Standing balance-Leahy Scale: Poor                               Pertinent Vitals/Pain Pain Assessment: Faces Faces Pain Scale: Hurts even more Pain Location: R ankle Pain Descriptors / Indicators: Discomfort;Grimacing Pain Intervention(s): Limited activity within patient's tolerance;Monitored during session;Premedicated before session;Repositioned    Home Living Family/patient expects to be discharged to:: Skilled nursing facility Living Arrangements: Spouse/significant other                    Prior Function Level of Independence: Independent               Hand Dominance        Extremity/Trunk Assessment   Upper Extremity Assessment Upper Extremity Assessment: Overall WFL for tasks assessed    Lower Extremity Assessment Lower Extremity Assessment: Overall WFL for tasks assessed(NWB status post op R ankle)       Communication   Communication: No difficulties  Cognition Arousal/Alertness: Awake/alert Behavior During Therapy: WFL for tasks assessed/performed Overall Cognitive Status: Within Functional Limits for tasks assessed  General Comments      Exercises     Assessment/Plan    PT Assessment Patient needs continued PT services  PT Problem List Decreased strength;Decreased range of motion;Decreased activity tolerance;Decreased balance;Decreased mobility;Pain       PT Treatment Interventions DME instruction;Gait training;Stair training;Functional mobility training;Therapeutic activities;Therapeutic exercise;Balance training    PT Goals (Current goals can be found in the Care Plan section)  Acute Rehab PT Goals Patient Stated Goal: open to rehab PT Goal Formulation: With patient Potential to Achieve Goals: Good    Frequency Min 3X/week   Barriers to discharge         Co-evaluation               AM-PAC PT "6 Clicks" Daily Activity  Outcome Measure Difficulty turning over in bed (including adjusting bedclothes, sheets and blankets)?: A Little Difficulty moving from lying on back to sitting on the side of the bed? : A Little Difficulty sitting down on and standing up from a chair with arms (e.g., wheelchair, bedside commode, etc,.)?: A Little Help needed moving to and from a bed to chair (including a wheelchair)?: A Lot Help needed walking in hospital room?: A Lot Help needed climbing 3-5 steps with a railing? : Total 6 Click Score: 14    End of Session Equipment Utilized During Treatment: Gait belt Activity Tolerance: Patient tolerated treatment well Patient left: in chair;with call bell/phone within reach Nurse Communication: Mobility status PT Visit Diagnosis: Unsteadiness on feet (R26.81);Other abnormalities of gait and mobility (R26.89);Pain;History of falling (Z91.81);Muscle weakness (generalized) (M62.81) Pain - Right/Left: Right Pain - part of body: Leg    Time: 0920-0950 PT Time Calculation (min) (ACUTE ONLY): 30 min   Charges:   PT Evaluation $PT Eval Low Complexity: 1 Low PT Treatments $Therapeutic Activity: 8-22 mins   PT G Codes:        Reinaldo Berber, PT, DPT Acute Rehab Services Pager: [phone]    Reinaldo Berber 12/27/2017, 10:16 AM

## 2017-12-27 NOTE — Clinical Social Work Note (Signed)
Clinical Social Work Assessment  Patient Details  Name: Maria [name] MRN: [id number] Date of Birth: May 27, 1946  Date of referral:  12/27/17               Reason for consult:  Facility Placement                Permission sought to share information with:  Facility Art therapist granted to share information::  Yes, Verbal Permission Granted  Name::        Agency::  SNF  Relationship::     Contact Information:     Housing/Transportation Living arrangements for the past 2 months:  Single Family Home Source of Information:  Patient Patient Interpreter Needed:  None Criminal Activity/Legal Involvement Pertinent to Current Situation/Hospitalization:  No - Comment as needed Significant Relationships:  Significant Other, Other Family Members Lives with:  Significant Other Do you feel safe going back to the place where you live?  No Need for family participation in patient care:  No (Coment)  Care giving concerns:  Pt with new impairment and will need rehab.  Social Worker assessment / plan:  CSW met with patient at bedside to discuss SNF placement. Pt agreeable to SNF at discharge. CSW explained the SNF process/placement. Pt desires to be close to home.  CSW obtained permission to send to SNF's. CSW explained the insurance authorization needed for SNF placement. CSW will send out offers and see who can offer a SNF bed.  CSW will f/u on disposition.  Employment status:  Retired Nurse, adult PT Recommendations:  [place] / Referral to community resources:  [place]  Patient/Family's Response to care:  Patient thanked CSW for meeting and discussing SNF placement. Pt agreeable to SNF recommendations.  Patient/Family's Understanding of and Emotional Response to Diagnosis, Current Treatment, and Prognosis:  Patient has good understanding of her diagnosis. Pt indicates that there significant other is  available to assist however she was agreeable to SNF. Pt was independent prior and hopes to return to her independence soon. CSW will assist with disposition. No issues or concerns identified.  Emotional Assessment Appearance:  Appears stated age Attitude/Demeanor/Rapport:  (Cooperative) Affect (typically observed):  Accepting, Appropriate Orientation:  Oriented to Self, Oriented to Place, Oriented to  Time, Oriented to Situation Alcohol / Substance use:  Not Applicable Psych involvement (Current and /or in the community):  No (Comment)  Discharge Needs  Concerns to be addressed:  Discharge Planning Concerns Readmission within the last 30 days:  No Current discharge risk:  Dependent with Mobility, Physical Impairment Barriers to Discharge:  No Barriers Identified   Normajean Baxter, LCSW 12/27/2017, 4:36 PM

## 2017-12-28 ENCOUNTER — Inpatient Hospital Stay (HOSPITAL_COMMUNITY): Payer: Medicare HMO

## 2017-12-28 LAB — BASIC METABOLIC PANEL
Anion gap: 8 (ref 5–15)
BUN: 18 mg/dL (ref 6–20)
CO2: 27 mmol/L (ref 22–32)
CREATININE: 0.74 mg/dL (ref 0.44–1.00)
Calcium: 8.6 mg/dL — ABNORMAL LOW (ref 8.9–10.3)
Chloride: 104 mmol/L (ref 101–111)
GFR calc non Af Amer: >60 mL/min (ref >60)
Glucose, Bld: 105 mg/dL — ABNORMAL HIGH (ref 65–99)
Potassium: 4.1 mmol/L (ref 3.5–5.1)
Sodium: 139 mmol/L (ref 135–145)

## 2017-12-28 MED ORDER — GUAIFENESIN-DM 100-10 MG/5ML PO SYRP
5.0000 mL | ORAL_SOLUTION | ORAL | Status: DC | PRN
  Administered 2017-12-28 (×2): 5 mL via ORAL
  Filled 2017-12-28 (×4): qty 5

## 2017-12-28 NOTE — Care Management Note (Signed)
Case Management Note  Patient Details  Name: Maria [name] MRN: [id number] Date of Birth: 1945/12/25  Subjective/Objective:  72 yr old female s/o ORIF of  Right ankle Bimalleolar fracture.                Action/Plan: Case manager spoke with patient concerning discharge plan and DME. Therapy recommended patient going to SNF for shortterm rehab but patient says she will go home, not interestwed in rehab. Choice for Home Health agency was offered, referral was called to [facility],  [place]. Patietn says she will have assistance at discharge.    Expected Discharge Date:    12/28/17              Expected Discharge Plan:  [place]  In-House Referral:     Discharge planning Services  CM Consult  Post Acute Care Choice:  Durable Medical Equipment, Home Health Choice offered to:  Patient  DME Arranged:  3-N-1, Walker rolling DME Agency:  [place]:  PT [place]:  [place]  Status of Service:  Completed, signed off  If discussed at [place] of Stay Meetings, dates discussed:    Additional Comments:  Ninfa Meeker, RN 12/28/2017, 1:25 PM

## 2017-12-28 NOTE — Clinical Social Work Note (Addendum)
CSW met with pt at bedside. Pt is refusing SNF at this time. Pt states her husband will be home to care for her. Pt is requesting DME. RNCM notifed and will meet with pt prior to d/c. Clinical Social Worker will sign off for now as social work intervention is no longer needed. Please consult [place] again if new need arises.   Shelton Silvas A Keeyon Privitera 12/28/2017

## 2017-12-28 NOTE — Progress Notes (Signed)
Physical Therapy Treatment Patient Details Name: Maria [name] MRN: [id number] DOB: Feb 28, 1946 Today's Date: 12/28/2017    History of Present Illness 72 y.o. female s/p R Ankle ORIF 12/26/17. PMH includes: HTN, Spinal Fusion.     PT Comments    Pt attempted gait for the first time today. She required verbal and visual cues for technique prior to ambulation. Pt able to maintain NWB to walk 3 ft. Pt fatigues quickly stating "that really took it out of me". Considering pt's h/o falls, current activity tolerance and level of assist required for mobility, pt would benefit from short term SNF level therapies to maximize functional independence; however pt is likely to refuse SNF. Will continue to follow acutely.   Follow Up Recommendations  SNF;Supervision/Assistance - 24 hour;Follow surgeon's recommendation for DC plan and follow-up therapies(may refuse SNF)     Equipment Recommendations  (TBD in next venue)    Recommendations for Other Services       Precautions / Restrictions Precautions Precautions: Fall Restrictions Weight Bearing Restrictions: Yes RLE Weight Bearing: Non weight bearing    Mobility  Bed Mobility Overal bed mobility: Modified Independent                Transfers Overall transfer level: Needs assistance Equipment used: Rolling walker (2 wheeled) Transfers: Sit to/from Stand Sit to Stand: Min assist         General transfer comment: min A to power up and stabalize. Cues for hand placement. Pt able to maintain NWB with cues  Ambulation/Gait Ambulation/Gait assistance: Min guard Ambulation Distance (Feet): 3 Feet Assistive device: Rolling walker (2 wheeled) Gait Pattern/deviations: Step-to pattern(hop to) Gait velocity: decreased   General Gait Details: Pt able to perform hop to pattern to step to chair 3 ft away. Cues provided for technique. Pt able to maintain NWB. She fatigued quickly.   Stairs             Wheelchair Mobility     Modified Rankin (Stroke Patients Only)       Balance Overall balance assessment: History of Falls;Needs assistance   Sitting balance-Leahy Scale: Good     Standing balance support: Bilateral upper extremity supported Standing balance-Leahy Scale: Poor Standing balance comment: reliant on UE support                            Cognition Arousal/Alertness: Awake/alert Behavior During Therapy: WFL for tasks assessed/performed Overall Cognitive Status: Within Functional Limits for tasks assessed                                        Exercises      General Comments        Pertinent Vitals/Pain Pain Assessment: No/denies pain Pain Intervention(s): Premedicated before session    Home Living                      Prior Function            PT Goals (current goals can now be found in the care plan section) Acute Rehab PT Goals Patient Stated Goal: open to rehab PT Goal Formulation: With patient Potential to Achieve Goals: Good Progress towards PT goals: Progressing toward goals    Frequency    Min 3X/week      PT Plan Current plan remains appropriate    Co-evaluation  AM-PAC PT "6 Clicks" Daily Activity  Outcome Measure  Difficulty turning over in bed (including adjusting bedclothes, sheets and blankets)?: A Little Difficulty moving from lying on back to sitting on the side of the bed? : A Little Difficulty sitting down on and standing up from a chair with arms (e.g., wheelchair, bedside commode, etc,.)?: A Little Help needed moving to and from a bed to chair (including a wheelchair)?: A Little Help needed walking in hospital room?: A Lot Help needed climbing 3-5 steps with a railing? : Total 6 Click Score: 15    End of Session Equipment Utilized During Treatment: Gait belt Activity Tolerance: Patient tolerated treatment well Patient left: in chair;with call bell/phone within reach Nurse  Communication: Mobility status PT Visit Diagnosis: Unsteadiness on feet (R26.81);Other abnormalities of gait and mobility (R26.89);Pain;History of falling (Z91.81);Muscle weakness (generalized) (M62.81) Pain - Right/Left: Right Pain - part of body: Leg     Time: 7741-2878 PT Time Calculation (min) (ACUTE ONLY): 15 min  Charges:  $Therapeutic Activity: 8-22 mins                    G Codes:      Benjiman Core, [place] Pager [phone] Acute Rehab    Allena Katz 12/28/2017, 12:00 PM

## 2017-12-28 NOTE — Care Management Important Message (Signed)
Important Message  Patient Details  Name: Maria [name] MRN: [id number] Date of Birth: July 01, 1946   Medicare Important Message Given:  No Patient refused to sign/unsigned copy left    Orbie Pyo 12/28/2017, 2:28 PM

## 2017-12-29 LAB — BASIC METABOLIC PANEL
ANION GAP: 10 (ref 5–15)
BUN: 19 mg/dL (ref 6–20)
CHLORIDE: 103 mmol/L (ref 101–111)
CO2: 23 mmol/L (ref 22–32)
Calcium: 8.8 mg/dL — ABNORMAL LOW (ref 8.9–10.3)
Creatinine, Ser: 0.64 mg/dL (ref 0.44–1.00)
GFR calc Af Amer: >60 mL/min (ref >60)
Glucose, Bld: 119 mg/dL — ABNORMAL HIGH (ref 65–99)
POTASSIUM: 4.2 mmol/L (ref 3.5–5.1)
SODIUM: 136 mmol/L (ref 135–145)

## 2017-12-29 MED ORDER — QUETIAPINE FUMARATE 400 MG PO TABS
200.0000 mg | ORAL_TABLET | Freq: Two times a day (BID) | ORAL | Status: DC
  Administered 2017-12-29 – 2018-01-01 (×7): 200 mg via ORAL
  Filled 2017-12-29 (×7): qty 1
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
## 2017-12-29 NOTE — Progress Notes (Signed)
   12/29/17 1800  Clinical Encounter Type  Visited With Patient  Visit Type Initial  Referral From Nurse  Consult/Referral To Chaplain  Spiritual Encounters  Spiritual Needs Emotional  Stress Factors  Patient Stress Factors Exhausted  Family Stress Factors None identified   Pt was laying in her bed while an environmental person  was helping clean the room. No family member on-site . Chaplain and Pt had lots of conversations about Ppt's concern for her partner. Chaplain provided emotional support through reflective and empathic listening and compassionate presence.   Lurline Caver a Medical sales representative, [employer]
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
## 2017-12-30 NOTE — Progress Notes (Signed)
Patient ID: Maria [name], female   DOB: 1946/03/01, 72 y.o.   MRN: [id number] The patient has been moved to another room for psycho-social purposes/safety issues.  The plan will be to hopefully get her to skilled nursing tomorrow for continued rehab due to her limited mobility from her ankle fracture.  This is certainly a patient safety issue.  Her ankle splint is fitting nicely and otherwise she is doing well.

## 2017-12-31 ENCOUNTER — Encounter (HOSPITAL_COMMUNITY): Payer: Self-pay | Admitting: Orthopaedic Surgery

## 2017-12-31 MED ORDER — BISACODYL 10 MG RE SUPP
10.0000 mg | Freq: Once | RECTAL | Status: AC
  Administered 2017-12-31: 10 mg via RECTAL
  Filled 2017-12-31: qty 1

## 2017-12-31 NOTE — Progress Notes (Signed)
Physical Therapy Treatment Patient Details Name: Maria [name] MRN: [id number] DOB: September 06, 1946 Today's Date: 12/31/2017    History of Present Illness 72 y.o. female s/p R Ankle ORIF 12/26/17. PMH includes: HTN, Spinal Fusion.     PT Comments    Pt performed gait and able to advance distance with encouragement from PTA this session.  Pt remains to require min guard to min assistance at this time and continues to benefit from short term SNF placement.  Rehab will allow patient to improve strength and function and return home in a more independent manner.    Follow Up Recommendations  SNF;Supervision/Assistance - 24 hour;Follow surgeon's recommendation for DC plan and follow-up therapies(Pt is agreeable to SNF placement.  )     Equipment Recommendations       Recommendations for Other Services       Precautions / Restrictions Precautions Precautions: Fall Restrictions Weight Bearing Restrictions: Yes RLE Weight Bearing: Non weight bearing    Mobility  Bed Mobility Overal bed mobility: Modified Independent                Transfers Overall transfer level: Needs assistance Equipment used: Rolling walker (2 wheeled) Transfers: Sit to/from Stand Sit to Stand: Min guard         General transfer comment: Min guard for safety with cues for hand placement to and from seated surface.  Pt performed multiple bouts of sit to stand to perform perianal care.    Ambulation/Gait Ambulation/Gait assistance: Min assist Ambulation Distance (Feet): 3 Feet(from bed to [facility] via stand pivot.  Post toileting patient performed 15 ft trial.  ) Assistive device: Rolling walker (2 wheeled) Gait Pattern/deviations: Step-to pattern;Trunk flexed;Narrow base of support(hop to with flexed posture.  ) Gait velocity: decreased   General Gait Details: Pt required close chair follow, x2 standing breaks and cues for upper trunk control.  Pt remains able to maintain NWB.  She continues to fatigue  quickly.     Stairs             Wheelchair Mobility    Modified Rankin (Stroke Patients Only)       Balance Overall balance assessment: History of Falls;Needs assistance Sitting-balance support: Feet supported Sitting balance-Leahy Scale: Good     Standing balance support: Bilateral upper extremity supported Standing balance-Leahy Scale: Poor Standing balance comment: reliant on UE support                            Cognition Arousal/Alertness: Awake/alert Behavior During Therapy: WFL for tasks assessed/performed Overall Cognitive Status: Within Functional Limits for tasks assessed                                        Exercises      General Comments        Pertinent Vitals/Pain Pain Assessment: 0-10 Pain Score: 5  Pain Location: R ankle Pain Descriptors / Indicators: Discomfort;Grimacing Pain Intervention(s): Monitored during session;Repositioned    Home Living                      Prior Function            PT Goals (current goals can now be found in the care plan section) Acute Rehab PT Goals Patient Stated Goal: open to rehab Potential to Achieve Goals: Good Progress towards PT goals: Progressing  toward goals    Frequency    Min 3X/week      PT Plan Current plan remains appropriate    Co-evaluation              AM-PAC PT "6 Clicks" Daily Activity  Outcome Measure  Difficulty turning over in bed (including adjusting bedclothes, sheets and blankets)?: None Difficulty moving from lying on back to sitting on the side of the bed? : Unable Difficulty sitting down on and standing up from a chair with arms (e.g., wheelchair, bedside commode, etc,.)?: Unable Help needed moving to and from a bed to chair (including a wheelchair)?: A Little Help needed walking in hospital room?: A Little Help needed climbing 3-5 steps with a railing? : Total 6 Click Score: 13    End of Session Equipment Utilized  During Treatment: Gait belt Activity Tolerance: Patient tolerated treatment well Patient left: in chair;with call bell/phone within reach Nurse Communication: Mobility status PT Visit Diagnosis: Unsteadiness on feet (R26.81);Other abnormalities of gait and mobility (R26.89);Pain;History of falling (Z91.81);Muscle weakness (generalized) (M62.81) Pain - Right/Left: Right Pain - part of body: Leg     Time: 0109-3235 PT Time Calculation (min) (ACUTE ONLY): 27 min  Charges:  $Gait Training: 8-22 mins $Therapeutic Activity: 8-22 mins                    G Codes:       Governor Rooks, PTA pager [phone]    Cristela Blue 12/31/2017, 2:42 PM
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
## 2017-12-31 NOTE — Social Work (Signed)
SNF is following up on Insurance auth.  CSW will continue to follow.  Elissa Hefty, LCSW Clinical Social Worker [phone]

## 2018-01-01 DIAGNOSIS — F419 Anxiety disorder, unspecified: Secondary | ICD-10-CM | POA: Diagnosis not present

## 2018-01-01 DIAGNOSIS — R2681 Unsteadiness on feet: Secondary | ICD-10-CM | POA: Diagnosis not present

## 2018-01-01 DIAGNOSIS — I10 Essential (primary) hypertension: Secondary | ICD-10-CM | POA: Diagnosis not present

## 2018-01-01 DIAGNOSIS — Z9889 Other specified postprocedural states: Secondary | ICD-10-CM | POA: Diagnosis not present

## 2018-01-01 DIAGNOSIS — F064 Anxiety disorder due to known physiological condition: Secondary | ICD-10-CM | POA: Diagnosis not present

## 2018-01-01 DIAGNOSIS — S82841G Displaced bimalleolar fracture of right lower leg, subsequent encounter for closed fracture with delayed healing: Secondary | ICD-10-CM | POA: Diagnosis not present

## 2018-01-01 DIAGNOSIS — M6281 Muscle weakness (generalized): Secondary | ICD-10-CM | POA: Diagnosis not present

## 2018-01-01 DIAGNOSIS — G8911 Acute pain due to trauma: Secondary | ICD-10-CM | POA: Diagnosis not present

## 2018-01-01 DIAGNOSIS — S8002XA Contusion of left knee, initial encounter: Secondary | ICD-10-CM | POA: Diagnosis not present

## 2018-01-01 DIAGNOSIS — R278 Other lack of coordination: Secondary | ICD-10-CM | POA: Diagnosis not present

## 2018-01-01 DIAGNOSIS — F319 Bipolar disorder, unspecified: Secondary | ICD-10-CM | POA: Diagnosis not present

## 2018-01-01 DIAGNOSIS — R2689 Other abnormalities of gait and mobility: Secondary | ICD-10-CM | POA: Diagnosis not present

## 2018-01-01 DIAGNOSIS — M25571 Pain in right ankle and joints of right foot: Secondary | ICD-10-CM | POA: Diagnosis not present

## 2018-01-01 DIAGNOSIS — G4700 Insomnia, unspecified: Secondary | ICD-10-CM | POA: Diagnosis not present

## 2018-01-01 DIAGNOSIS — Z9181 History of falling: Secondary | ICD-10-CM | POA: Diagnosis not present

## 2018-01-01 DIAGNOSIS — S82841S Displaced bimalleolar fracture of right lower leg, sequela: Secondary | ICD-10-CM | POA: Diagnosis not present

## 2018-01-01 DIAGNOSIS — S82841A Displaced bimalleolar fracture of right lower leg, initial encounter for closed fracture: Secondary | ICD-10-CM | POA: Diagnosis not present

## 2018-01-01 NOTE — Progress Notes (Signed)
RN called report to [place] at [facility]. All questions answered. All pt's belongings gathered to be sent with her. Waiting on PTAR for transport.

## 2018-01-01 NOTE — Social Work (Signed)
CSW was advised by SNF that they received Insurance Auth for SNF placement at [facility].  CSW f/ufor disposition.  Elissa Hefty, LCSW Clinical Social Worker [phone]

## 2018-01-01 NOTE — Plan of Care (Signed)
  Problem: Elimination: Goal: Will not experience complications related to bowel motility Outcome: Progressing   Problem: Safety: Goal: Ability to remain free from injury will improve Outcome: Progressing   

## 2018-01-01 NOTE — Progress Notes (Signed)
Patient ID: Maria [name], female   DOB: 01/30/1946, 72 y.o.   MRN: [id number] There are no changes to patients discharge status in the last 24 hrs. See previous discharge summary.

## 2018-01-01 NOTE — Clinical Social Work Placement (Signed)
   CLINICAL SOCIAL WORK PLACEMENT  NOTE  Date:  01/01/2018  Patient Details  Name: Quintana Canelo MRN: [id number] Date of Birth: 1946-08-22  Clinical Social Work is seeking post-discharge placement for this patient at the [place] level of care (*CSW will initial, date and re-position this form in  chart as items are completed):  Yes   Patient/family provided with [facility] Work Department's list of facilities offering this level of care within the geographic area requested by the patient (or if unable, by the patient's family).  Yes   Patient/family informed of their freedom to choose among providers that offer the needed level of care, that participate in Medicare, Medicaid or managed care program needed by the patient, have an available bed and are willing to accept the patient.  Yes   Patient/family informed of [facility]'s ownership interest in [facility] and [facility], as well as of the fact that they are under no obligation to receive care at these facilities.  PASRR submitted to EDS on       PASRR number received on 12/27/17     Existing PASRR number confirmed on       FL2 transmitted to all facilities in geographic area requested by pt/family on 12/27/17     FL2 transmitted to all facilities within larger geographic area on       Patient informed that his/her managed care company has contracts with or will negotiate with certain facilities, including the following:        Yes   Patient/family informed of bed offers received.  Patient chooses bed at [facility]     Physician recommends and patient chooses bed at      Patient to be transferred to [facility] on 01/01/18.  Patient to be transferred to facility by [facility]     Patient family notified on 01/01/18 of transfer.  Name of family member notified:  pt responsible for self     PHYSICIAN       Additional Comment:     _______________________________________________ Normajean Baxter, LCSW 01/01/2018, 4:06 PM

## 2018-01-01 NOTE — Social Work (Signed)
Clinical Social Worker facilitated patient discharge including contacting patient family and facility to confirm patient discharge plans.  Clinical information faxed to facility and family agreeable with plan.    CSW arranged ambulance transport via [facility] to [facility].    RN to call [phone] to give report prior to discharge.  Clinical Social Worker will sign off for now as social work intervention is no longer needed. Please consult us again if new need arises.  Anael Rosch, LCSW Clinical Social Worker [phone]      

## 2018-01-02 DIAGNOSIS — F419 Anxiety disorder, unspecified: Secondary | ICD-10-CM | POA: Diagnosis not present

## 2018-01-02 DIAGNOSIS — I10 Essential (primary) hypertension: Secondary | ICD-10-CM | POA: Diagnosis not present

## 2018-01-02 DIAGNOSIS — F319 Bipolar disorder, unspecified: Secondary | ICD-10-CM | POA: Diagnosis not present

## 2018-01-02 DIAGNOSIS — R2681 Unsteadiness on feet: Secondary | ICD-10-CM | POA: Diagnosis not present

## 2018-01-02 DIAGNOSIS — F064 Anxiety disorder due to known physiological condition: Secondary | ICD-10-CM | POA: Diagnosis not present

## 2018-01-02 DIAGNOSIS — S82841A Displaced bimalleolar fracture of right lower leg, initial encounter for closed fracture: Secondary | ICD-10-CM | POA: Diagnosis not present

## 2018-01-02 DIAGNOSIS — S82841S Displaced bimalleolar fracture of right lower leg, sequela: Secondary | ICD-10-CM | POA: Diagnosis not present

## 2018-01-04 DIAGNOSIS — R2681 Unsteadiness on feet: Secondary | ICD-10-CM | POA: Diagnosis not present

## 2018-01-04 DIAGNOSIS — S82841S Displaced bimalleolar fracture of right lower leg, sequela: Secondary | ICD-10-CM | POA: Diagnosis not present

## 2018-01-04 DIAGNOSIS — Z9181 History of falling: Secondary | ICD-10-CM | POA: Diagnosis not present

## 2018-01-04 DIAGNOSIS — M25571 Pain in right ankle and joints of right foot: Secondary | ICD-10-CM | POA: Diagnosis not present

## 2018-01-08 ENCOUNTER — Inpatient Hospital Stay (INDEPENDENT_AMBULATORY_CARE_PROVIDER_SITE_OTHER): Payer: Self-pay | Admitting: Orthopaedic Surgery

## 2018-01-08 DIAGNOSIS — F319 Bipolar disorder, unspecified: Secondary | ICD-10-CM | POA: Diagnosis not present

## 2018-01-08 DIAGNOSIS — I10 Essential (primary) hypertension: Secondary | ICD-10-CM | POA: Diagnosis not present

## 2018-01-08 DIAGNOSIS — S82841A Displaced bimalleolar fracture of right lower leg, initial encounter for closed fracture: Secondary | ICD-10-CM | POA: Diagnosis not present

## 2018-01-08 DIAGNOSIS — Z9889 Other specified postprocedural states: Secondary | ICD-10-CM | POA: Diagnosis not present

## 2018-01-09 DIAGNOSIS — M25571 Pain in right ankle and joints of right foot: Secondary | ICD-10-CM | POA: Diagnosis not present

## 2018-01-09 DIAGNOSIS — Z9181 History of falling: Secondary | ICD-10-CM | POA: Diagnosis not present

## 2018-01-09 DIAGNOSIS — R2681 Unsteadiness on feet: Secondary | ICD-10-CM | POA: Diagnosis not present

## 2018-01-09 DIAGNOSIS — S82841S Displaced bimalleolar fracture of right lower leg, sequela: Secondary | ICD-10-CM | POA: Diagnosis not present

## 2018-01-11 DIAGNOSIS — G4700 Insomnia, unspecified: Secondary | ICD-10-CM | POA: Diagnosis not present

## 2018-01-15 ENCOUNTER — Inpatient Hospital Stay (INDEPENDENT_AMBULATORY_CARE_PROVIDER_SITE_OTHER): Payer: Medicare HMO | Admitting: Orthopaedic Surgery

## 2018-01-15 DIAGNOSIS — G629 Polyneuropathy, unspecified: Secondary | ICD-10-CM | POA: Diagnosis not present

## 2018-01-15 DIAGNOSIS — I10 Essential (primary) hypertension: Secondary | ICD-10-CM | POA: Diagnosis not present

## 2018-01-15 DIAGNOSIS — G894 Chronic pain syndrome: Secondary | ICD-10-CM | POA: Diagnosis not present

## 2018-01-15 DIAGNOSIS — M19049 Primary osteoarthritis, unspecified hand: Secondary | ICD-10-CM | POA: Diagnosis not present

## 2018-01-31 ENCOUNTER — Inpatient Hospital Stay (INDEPENDENT_AMBULATORY_CARE_PROVIDER_SITE_OTHER): Payer: Medicare HMO | Admitting: Surgery

## 2018-02-04 ENCOUNTER — Other Ambulatory Visit: Payer: Self-pay

## 2018-02-04 ENCOUNTER — Emergency Department (HOSPITAL_COMMUNITY)
Admission: EM | Admit: 2018-02-04 | Discharge: 2018-02-04 | Disposition: A | Discharge status: Home / Self Care | Payer: Medicare HMO | Attending: Emergency Medicine | Admitting: Emergency Medicine

## 2018-02-04 ENCOUNTER — Emergency Department (HOSPITAL_COMMUNITY): Payer: Medicare HMO

## 2018-02-04 ENCOUNTER — Encounter (HOSPITAL_COMMUNITY): Payer: Self-pay

## 2018-02-04 DIAGNOSIS — Z79899 Other long term (current) drug therapy: Secondary | ICD-10-CM | POA: Insufficient documentation

## 2018-02-04 DIAGNOSIS — I10 Essential (primary) hypertension: Secondary | ICD-10-CM | POA: Diagnosis not present

## 2018-02-04 DIAGNOSIS — R52 Pain, unspecified: Secondary | ICD-10-CM | POA: Diagnosis not present

## 2018-02-04 DIAGNOSIS — L03115 Cellulitis of right lower limb: Secondary | ICD-10-CM | POA: Diagnosis not present

## 2018-02-04 DIAGNOSIS — F17210 Nicotine dependence, cigarettes, uncomplicated: Secondary | ICD-10-CM | POA: Insufficient documentation

## 2018-02-04 DIAGNOSIS — M25571 Pain in right ankle and joints of right foot: Secondary | ICD-10-CM | POA: Diagnosis not present

## 2018-02-04 LAB — BASIC METABOLIC PANEL
Anion gap: 9 (ref 5–15)
BUN: 24 mg/dL — ABNORMAL HIGH (ref 6–20)
CALCIUM: 9.5 mg/dL (ref 8.9–10.3)
CHLORIDE: 103 mmol/L (ref 101–111)
CO2: 29 mmol/L (ref 22–32)
CREATININE: 1.21 mg/dL — AB (ref 0.44–1.00)
GFR calc non Af Amer: 44 mL/min — ABNORMAL LOW (ref >60)
GFR, EST AFRICAN AMERICAN: 51 mL/min — AB (ref >60)
Glucose, Bld: 108 mg/dL — ABNORMAL HIGH (ref 65–99)
Potassium: 4.3 mmol/L (ref 3.5–5.1)
SODIUM: 141 mmol/L (ref 135–145)

## 2018-02-04 LAB — CBC WITH DIFFERENTIAL/PLATELET
Abs Immature Granulocytes: 0.1 10*3/uL (ref 0.0–0.1)
BASOS ABS: 0.1 10*3/uL (ref 0.0–0.1)
Basophils Relative: 1 %
EOS PCT: 2 %
Eosinophils Absolute: 0.2 10*3/uL (ref 0.0–0.7)
HCT: 42.9 % (ref 36.0–46.0)
HEMOGLOBIN: 13 g/dL (ref 12.0–15.0)
Immature Granulocytes: 1 %
LYMPHS PCT: 31 %
Lymphs Abs: 2.8 10*3/uL (ref 0.7–4.0)
MCH: 25.1 pg — ABNORMAL LOW (ref 26.0–34.0)
MCHC: 30.3 g/dL (ref 30.0–36.0)
MCV: 83 fL (ref 78.0–100.0)
Monocytes Absolute: 0.6 10*3/uL (ref 0.1–1.0)
Monocytes Relative: 7 %
NEUTROS ABS: 5.4 10*3/uL (ref 1.7–7.7)
Neutrophils Relative %: 58 %
Platelets: 445 10*3/uL — ABNORMAL HIGH (ref 150–400)
RBC: 5.17 MIL/uL — AB (ref 3.87–5.11)
RDW: 15.5 % (ref 11.5–15.5)
WBC: 9 10*3/uL (ref 4.0–10.5)

## 2018-02-04 LAB — I-STAT CG4 LACTIC ACID, ED: LACTIC ACID, VENOUS: 0.69 mmol/L (ref 0.5–1.9)

## 2018-02-04 LAB — C-REACTIVE PROTEIN: CRP: <0.8 mg/dL (ref <1.0)

## 2018-02-04 LAB — SEDIMENTATION RATE: SED RATE: 20 mm/h (ref 0–22)

## 2018-02-04 MED ORDER — DOXYCYCLINE HYCLATE 100 MG PO TABS
100.0000 mg | ORAL_TABLET | Freq: Once | ORAL | Status: AC
  Administered 2018-02-04: 100 mg via ORAL
  Filled 2018-02-04: qty 1

## 2018-02-04 MED ORDER — HYDROCODONE-ACETAMINOPHEN 5-325 MG PO TABS
1.0000 | ORAL_TABLET | Freq: Once | ORAL | Status: AC
  Administered 2018-02-04: 1 via ORAL
  Filled 2018-02-04: qty 1

## 2018-02-04 MED ORDER — DOXYCYCLINE HYCLATE 100 MG PO CAPS: 100.0000 mg | ORAL_CAPSULE | Freq: Two times a day (BID) | ORAL | 0 refills | Status: DC

## 2018-02-04 MED ORDER — SODIUM CHLORIDE 0.9 % IV BOLUS
1000.0000 mL | Freq: Once | INTRAVENOUS | Status: AC
  Administered 2018-02-04: 1000 mL via INTRAVENOUS

## 2018-02-04 NOTE — Discharge Instructions (Signed)
Wear the boot at all times while you are walking.  You can take it off to shower.  Elevate the right ankle when you are sitting.  Return if you develop high fever or redness starts spreading up your leg.  Take all of the antibiotic until it is finished.

## 2018-02-04 NOTE — ED Notes (Signed)
Ortho tech paged  

## 2018-02-04 NOTE — ED Triage Notes (Signed)
Pt presents via [facility] for evaluation of ankle pain. Pt had ORIF on 4/17 and missed follow up appointment. EMS reports she took the cast off and still has sutures/staples in place.

## 2018-02-04 NOTE — ED Provider Notes (Signed)
[place] EMERGENCY DEPARTMENT Provider Note   CSN: [id number] Arrival date & time: 02/04/18  1351     History   Chief Complaint Chief Complaint  Patient presents with  . Ankle Pain    HPI Maria [name] is a 72 y.o. female.  Patient is a 72 year old female with a history of bipolar disease, hypertension and history of mechanical fall resulting in a closed ankle fracture 8 weeks ago that underwent ORIF.  Patient was in rehab for 2 weeks but left one day early.  She states prior to leaving rehab she was supposed to have her foot placed in a cast and continue nonweightbearing.  However she left before that and she has been walking on it.  She noted 2 weeks ago it started to become more red and much more painful when she was trying to get around.  She is also noted intermittent fevers in the last 2 weeks but does not know how high they have been.  She gets intermittent chills.  She denies any cough, chest pain or shortness of breath.  She has been taking BC powders for the pain but is not improving.  She is also noted some drainage around where the staples are.  Patient did note she never followed up with her orthopedist because she had too many other things to do.  The history is provided by the patient.  Ankle Pain   Incident onset: 2 weeks ago. There was no injury mechanism. The pain is present in the right knee. The quality of the pain is described as sharp and throbbing. The pain is at a severity of 7/10. The pain is moderate. The pain has been constant since onset. Associated symptoms comments: Pain worse with weight bearing but still walking on it.  Staples still in place and draining from the staples.. The symptoms are aggravated by activity and bearing weight. She has tried rest (BC powder) for the symptoms. The treatment provided no relief.    Past Medical History:  Diagnosis Date  . Arthritis   . Bipolar disorder ([place])   . Fracture, ankle 12/25/2017   closed  right ankle fracture  . History of kidney stones   . Hypertension     Patient Active Problem List   Diagnosis Date Noted  . Closed right ankle fracture 12/25/2017    Past Surgical History:  Procedure Laterality Date  . bladder tack    . BREAST LUMPECTOMY    . CARPAL TUNNEL RELEASE Bilateral   . CHOLECYSTECTOMY    . LAMINECTOMY     neck  . ORIF ANKLE FRACTURE Right 12/26/2017   Procedure: OPEN REDUCTION INTERNAL FIXATION (ORIF) RIGHT BIMALLEOLAR ANKLE FRACTURE;  Surgeon: Marybelle Killings, MD;  Location: [facility];  Service: Orthopedics;  Laterality: Right;  . RADICAL HYSTERECTOMY    . spinal fusion       OB History   None      Home Medications    Prior to Admission medications   Medication Sig Start Date End Date Taking? Authorizing Provider  amitriptyline (ELAVIL) 25 MG tablet Take 25 mg by mouth at bedtime.   Yes [provider]  aspirin EC 325 MG tablet Take 1 tablet (325 mg total) by mouth daily. 12/27/17 12/27/18 Yes Lanae Crumbly, PA-C  clonazePAM (KLONOPIN) 1 MG tablet Take 1 mg by mouth 2 (two) times daily as needed for anxiety.   Yes [provider]  gabapentin (NEURONTIN) 300 MG capsule Take 300 mg by mouth  2 (two) times daily. 01/15/18  Yes [provider]  HYDROcodone-acetaminophen (NORCO/VICODIN) 5-325 MG tablet Take 1-2 tablets by mouth every 6 (six) hours as needed for moderate pain. 12/27/17  Yes Lanae Crumbly, PA-C  lisinopril (PRINIVIL,ZESTRIL) 10 MG tablet Take 10 mg by mouth daily.   Yes [provider]  oxyCODONE-acetaminophen (PERCOCET) 10-325 MG tablet Take 1 tablet by mouth every 8 (eight) hours as needed for pain. 01/15/18  Yes [provider]  QUEtiapine (SEROQUEL) 200 MG tablet Take 1 tablet (200 mg total) by mouth 2 (two) times daily. 09/04/16  Yes Domenic Moras, PA-C    Family History No family history on file.  Social History Social History   Tobacco Use  . Smoking status: Current Every Day Smoker     Packs/day: 0.50    Types: Cigarettes  . Smokeless tobacco: Never Used  Substance Use Topics  . Alcohol use: No  . Drug use: No     Allergies   Penicillins   Review of Systems Review of Systems  All other systems reviewed and are negative.    Physical Exam Updated Vital Signs BP 97/75 (BP Location: Right Arm)   Pulse 90   Temp 98.7 F (37.1 C) (Oral)   Resp 16   SpO2 94%   Physical Exam  Constitutional: She is oriented to person, place, and time. She appears well-developed and well-nourished. No distress.  HENT:  Head: Normocephalic and atraumatic.  Mouth/Throat: Oropharynx is clear and moist.  Eyes: Pupils are equal, round, and reactive to light. Conjunctivae and EOM are normal.  Neck: Normal range of motion. Neck supple.  Cardiovascular: Normal rate, regular rhythm and intact distal pulses.  No murmur heard. Pulmonary/Chest: Effort normal and breath sounds normal. No respiratory distress. She has no wheezes. She has no rales.  Abdominal: Soft. She exhibits no distension. There is no tenderness. There is no rebound and no guarding.  Musculoskeletal: Normal range of motion. She exhibits edema and tenderness.       Feet:  No calf pain or swelling  Neurological: She is alert and oriented to person, place, and time.  Skin: Skin is warm and dry. Capillary refill takes less than 2 seconds. No rash noted. No erythema.  Psychiatric: She has a normal mood and affect. Her behavior is normal.  Nursing note and vitals reviewed.    ED Treatments / Results  Labs (all labs ordered are listed, but only abnormal results are displayed) Labs Reviewed  CBC WITH DIFFERENTIAL/PLATELET - Abnormal; Notable for the following components:      Result Value   RBC 5.17 (*)    MCH 25.1 (*)    Platelets 445 (*)    All other components within normal limits  BASIC METABOLIC PANEL - Abnormal; Notable for the following components:   Glucose, Bld 108 (*)    BUN 24 (*)    Creatinine, Ser  1.21 (*)    GFR calc non Af Amer 44 (*)    GFR calc Af Amer 51 (*)    All other components within normal limits  SEDIMENTATION RATE  C-REACTIVE PROTEIN  I-STAT CG4 LACTIC ACID, ED    EKG None  Radiology Dg Ankle Complete Right  Result Date: 02/04/2018 CLINICAL DATA:  Status post fall in bathtub, with acute onset of right ankle pain. Initial encounter. EXAM: RIGHT ANKLE - COMPLETE 3+ VIEW COMPARISON:  Right ankle radiographs and right ankle CT performed 12/18/2017 FINDINGS: No new fractures are seen. The plate and screws along  the distal fibula and the screws at the distal tibia are intact. The ankle mortise is grossly unremarkable in appearance. The tiny osseous fragment along the medial aspect of the talus was present on prior CT. The subtalar joint is grossly unremarkable. A small plantar calcaneal spur is seen. Mild soft tissue swelling is noted about the ankle. IMPRESSION: No new fracture seen. Visualized hardware appears grossly intact. Electronically Signed   By: Garald Balding M.D.   On: 02/04/2018 18:16    Procedures Procedures (including critical care time)  Medications Ordered in ED Medications  sodium chloride 0.9 % bolus 1,000 mL (has no administration in time range)  HYDROcodone-acetaminophen (NORCO/VICODIN) 5-325 MG per tablet 1 tablet (has no administration in time range)     Initial Impression / Assessment and Plan / ED Course  I have reviewed the triage vital signs and the nursing notes.  Pertinent labs & imaging results that were available during my care of the patient were reviewed by me and considered in my medical decision making (see chart for details).     Patient is a 72 year old female presenting today with worsening pain in her right ankle over the last 2 weeks.  Patient recently had ORIF within the last 8 weeks for a fracture after mechanical fall.  Patient has not followed up with orthopedics.  She has been bearing weight and never had a cast placed.   The staples were never removed.  However over the last 2 weeks she has been feeling much worse.  She is also complaining of subjective fever.  The surgical wound is erythematous and warm.  There is no notable drainage at this time but evidence of where it was draining around some of the staples.  All the staples were removed.  Incision is intact.  Concern for cellulitis versus joint infection. CBC, BMP, lactate, ESR, CRP and x-ray pending.  Patient given pain medication and IV fluids.  7:25 PM Labs show a normal white count and lactic acid.  Patient's BMP with mild AKI with a creatinine 1.21 but patient states she has not been eating and drinking very well.  Patient's CRP is within normal limits.  X-ray shows hardware is intact.  Lower suspicion for joint infection however feel that patient does have some cellulitis.  Will discuss with [facility] orth[facility].  7:55 PM Spoke with Dr. Ninfa Linden who recommended placing pt in a boot and doxycycline for cellulitis and f/u in the office.  Findings discussed with the patient and she was discharged home. Final Clinical Impressions(s) / ED Diagnoses   Final diagnoses:  Cellulitis of right ankle    ED Discharge Orders        Ordered    doxycycline (VIBRAMYCIN) 100 MG capsule  2 times daily     02/04/18 1957       Blanchie Dessert, MD 02/04/18 1958

## 2018-02-05 ENCOUNTER — Telehealth (INDEPENDENT_AMBULATORY_CARE_PROVIDER_SITE_OTHER): Payer: Self-pay | Admitting: Orthopaedic Surgery

## 2018-02-05 NOTE — Telephone Encounter (Signed)
Patient called requesting an RX be called in for her for pain.  I advised her that she had an appointment for May 23rd to see Jeneen Rinks.  She stated that she was unaware of the appointment.  She uses [facility] on [address]  [phone].  Thank you.

## 2018-02-05 NOTE — Telephone Encounter (Signed)
Returned call to patient left message to return call  [phone]

## 2018-02-05 NOTE — Telephone Encounter (Signed)
Needs to come in . Noncompliant, has not been seen.

## 2018-02-05 NOTE — Telephone Encounter (Signed)
Patient did not keep appt for post op follow up. She has now been seen in the ED for possible cellulitis. She still does not have follow up appt scheduled. Please advise.

## 2018-02-06 NOTE — Telephone Encounter (Signed)
Patient called back and I advised her of the message from Dr. Lorin Mercy.  She stated that she is in pain now and could not get back over here to our office for 2 weeks.  RC#[phone].  Thank you.

## 2018-02-06 NOTE — Telephone Encounter (Signed)
I left detailed message for patient explaining. I did advise she needed to follow up in the office and to please call back and make an appt.

## 2018-02-07 NOTE — Telephone Encounter (Signed)
I left voicemail for patient explaining that Dr. Lorin Mercy is unable to prescribe pain medication to someone who is not being followed in the office, and that she has not been seen here since her surgery. Advised to please call back to make an appointment for follow up and recheck.

## 2018-02-11 DIAGNOSIS — M19049 Primary osteoarthritis, unspecified hand: Secondary | ICD-10-CM | POA: Diagnosis not present

## 2018-02-11 DIAGNOSIS — I10 Essential (primary) hypertension: Secondary | ICD-10-CM | POA: Diagnosis not present

## 2018-02-11 DIAGNOSIS — G629 Polyneuropathy, unspecified: Secondary | ICD-10-CM | POA: Diagnosis not present

## 2018-02-11 DIAGNOSIS — G894 Chronic pain syndrome: Secondary | ICD-10-CM | POA: Diagnosis not present

## 2018-02-13 ENCOUNTER — Inpatient Hospital Stay (INDEPENDENT_AMBULATORY_CARE_PROVIDER_SITE_OTHER): Payer: Medicare HMO | Admitting: Surgery

## 2018-02-21 DIAGNOSIS — M19049 Primary osteoarthritis, unspecified hand: Secondary | ICD-10-CM | POA: Diagnosis not present

## 2018-02-21 DIAGNOSIS — M5136 Other intervertebral disc degeneration, lumbar region: Secondary | ICD-10-CM | POA: Diagnosis not present

## 2018-02-21 DIAGNOSIS — G894 Chronic pain syndrome: Secondary | ICD-10-CM | POA: Diagnosis not present

## 2018-02-21 DIAGNOSIS — I10 Essential (primary) hypertension: Secondary | ICD-10-CM | POA: Diagnosis not present

## 2018-04-07 ENCOUNTER — Other Ambulatory Visit: Payer: Self-pay

## 2018-04-07 ENCOUNTER — Observation Stay (HOSPITAL_COMMUNITY)
Admission: EM | Admit: 2018-04-07 | Discharge: 2018-04-09 | Disposition: A | Discharge status: Home / Self Care | Payer: Medicare HMO | Attending: Nephrology | Admitting: Nephrology

## 2018-04-07 ENCOUNTER — Emergency Department (HOSPITAL_COMMUNITY): Payer: Medicare HMO

## 2018-04-07 ENCOUNTER — Encounter (HOSPITAL_COMMUNITY): Payer: Self-pay | Admitting: *Deleted

## 2018-04-07 DIAGNOSIS — J439 Emphysema, unspecified: Secondary | ICD-10-CM | POA: Insufficient documentation

## 2018-04-07 DIAGNOSIS — I130 Hypertensive heart and chronic kidney disease with heart failure and stage 1 through stage 4 chronic kidney disease, or unspecified chronic kidney disease: Secondary | ICD-10-CM | POA: Insufficient documentation

## 2018-04-07 DIAGNOSIS — Z8249 Family history of ischemic heart disease and other diseases of the circulatory system: Secondary | ICD-10-CM | POA: Insufficient documentation

## 2018-04-07 DIAGNOSIS — Z880 Allergy status to penicillin: Secondary | ICD-10-CM | POA: Diagnosis not present

## 2018-04-07 DIAGNOSIS — N200 Calculus of kidney: Secondary | ICD-10-CM | POA: Insufficient documentation

## 2018-04-07 DIAGNOSIS — Z9889 Other specified postprocedural states: Secondary | ICD-10-CM | POA: Diagnosis not present

## 2018-04-07 DIAGNOSIS — R2689 Other abnormalities of gait and mobility: Secondary | ICD-10-CM | POA: Diagnosis not present

## 2018-04-07 DIAGNOSIS — F17210 Nicotine dependence, cigarettes, uncomplicated: Secondary | ICD-10-CM | POA: Insufficient documentation

## 2018-04-07 DIAGNOSIS — M1990 Unspecified osteoarthritis, unspecified site: Secondary | ICD-10-CM | POA: Diagnosis not present

## 2018-04-07 DIAGNOSIS — Z9049 Acquired absence of other specified parts of digestive tract: Secondary | ICD-10-CM | POA: Diagnosis not present

## 2018-04-07 DIAGNOSIS — I447 Left bundle-branch block, unspecified: Secondary | ICD-10-CM | POA: Diagnosis not present

## 2018-04-07 DIAGNOSIS — Z79899 Other long term (current) drug therapy: Secondary | ICD-10-CM | POA: Diagnosis not present

## 2018-04-07 DIAGNOSIS — Z87442 Personal history of urinary calculi: Secondary | ICD-10-CM | POA: Diagnosis not present

## 2018-04-07 DIAGNOSIS — N183 Chronic kidney disease, stage 3 unspecified: Secondary | ICD-10-CM | POA: Diagnosis present

## 2018-04-07 DIAGNOSIS — R2681 Unsteadiness on feet: Secondary | ICD-10-CM | POA: Insufficient documentation

## 2018-04-07 DIAGNOSIS — Z7982 Long term (current) use of aspirin: Secondary | ICD-10-CM | POA: Insufficient documentation

## 2018-04-07 DIAGNOSIS — M6281 Muscle weakness (generalized): Secondary | ICD-10-CM | POA: Insufficient documentation

## 2018-04-07 DIAGNOSIS — R0602 Shortness of breath: Secondary | ICD-10-CM | POA: Diagnosis present

## 2018-04-07 DIAGNOSIS — F419 Anxiety disorder, unspecified: Secondary | ICD-10-CM | POA: Insufficient documentation

## 2018-04-07 DIAGNOSIS — F319 Bipolar disorder, unspecified: Secondary | ICD-10-CM | POA: Diagnosis not present

## 2018-04-07 DIAGNOSIS — Z981 Arthrodesis status: Secondary | ICD-10-CM | POA: Diagnosis not present

## 2018-04-07 DIAGNOSIS — Z90710 Acquired absence of both cervix and uterus: Secondary | ICD-10-CM | POA: Diagnosis not present

## 2018-04-07 DIAGNOSIS — I700 Atherosclerosis of aorta: Secondary | ICD-10-CM | POA: Diagnosis not present

## 2018-04-07 DIAGNOSIS — I10 Essential (primary) hypertension: Secondary | ICD-10-CM | POA: Diagnosis present

## 2018-04-07 DIAGNOSIS — J449 Chronic obstructive pulmonary disease, unspecified: Secondary | ICD-10-CM | POA: Diagnosis present

## 2018-04-07 DIAGNOSIS — F411 Generalized anxiety disorder: Secondary | ICD-10-CM

## 2018-04-07 DIAGNOSIS — J9601 Acute respiratory failure with hypoxia: Secondary | ICD-10-CM

## 2018-04-07 DIAGNOSIS — R739 Hyperglycemia, unspecified: Secondary | ICD-10-CM | POA: Diagnosis not present

## 2018-04-07 DIAGNOSIS — R0600 Dyspnea, unspecified: Secondary | ICD-10-CM | POA: Diagnosis present

## 2018-04-07 DIAGNOSIS — F17200 Nicotine dependence, unspecified, uncomplicated: Secondary | ICD-10-CM | POA: Diagnosis present

## 2018-04-07 DIAGNOSIS — R7989 Other specified abnormal findings of blood chemistry: Secondary | ICD-10-CM

## 2018-04-07 HISTORY — DX: Chronic obstructive pulmonary disease, unspecified: J44.9

## 2018-04-07 LAB — CBC
HCT: 40.8 % (ref 36.0–46.0)
HEMOGLOBIN: 12.2 g/dL (ref 12.0–15.0)
MCH: 24.3 pg — AB (ref 26.0–34.0)
MCHC: 29.9 g/dL — AB (ref 30.0–36.0)
MCV: 81.3 fL (ref 78.0–100.0)
PLATELETS: 290 10*3/uL (ref 150–400)
RBC: 5.02 MIL/uL (ref 3.87–5.11)
RDW: 15.9 % — AB (ref 11.5–15.5)
WBC: 8.2 10*3/uL (ref 4.0–10.5)

## 2018-04-07 LAB — TROPONIN I

## 2018-04-07 LAB — BASIC METABOLIC PANEL
ANION GAP: 10 (ref 5–15)
BUN: 15 mg/dL (ref 8–23)
CALCIUM: 8.6 mg/dL — AB (ref 8.9–10.3)
CO2: 21 mmol/L — ABNORMAL LOW (ref 22–32)
CREATININE: 1.13 mg/dL — AB (ref 0.44–1.00)
Chloride: 107 mmol/L (ref 98–111)
GFR calc Af Amer: 55 mL/min — ABNORMAL LOW (ref >60)
GFR, EST NON AFRICAN AMERICAN: 48 mL/min — AB (ref >60)
GLUCOSE: 125 mg/dL — AB (ref 70–99)
Potassium: 3 mmol/L — ABNORMAL LOW (ref 3.5–5.1)
Sodium: 138 mmol/L (ref 135–145)

## 2018-04-07 MED ORDER — LORAZEPAM 1 MG PO TABS
1.0000 mg | ORAL_TABLET | Freq: Once | ORAL | Status: AC
  Administered 2018-04-08: 1 mg via ORAL
  Filled 2018-04-07: qty 1

## 2018-04-07 NOTE — ED Notes (Signed)
Pt retujrned from xray  She reports that she is breathing better with the nasal 02  There is no 02 going  Through the nasal cannula since she arrived  sats good on room air

## 2018-04-07 NOTE — ED Triage Notes (Signed)
The pt arrived by gems from home c/o some sob and rapid heart rated  Hx of anxiety

## 2018-04-07 NOTE — ED Notes (Signed)
Pt c/o dry mouth  Hyperventilating on arrival

## 2018-04-08 ENCOUNTER — Ambulatory Visit (HOSPITAL_BASED_OUTPATIENT_CLINIC_OR_DEPARTMENT_OTHER): Payer: Medicare HMO

## 2018-04-08 ENCOUNTER — Emergency Department (HOSPITAL_COMMUNITY): Payer: Medicare HMO

## 2018-04-08 ENCOUNTER — Encounter (HOSPITAL_COMMUNITY): Payer: Self-pay | Admitting: Cardiology

## 2018-04-08 ENCOUNTER — Other Ambulatory Visit: Payer: Self-pay

## 2018-04-08 DIAGNOSIS — J449 Chronic obstructive pulmonary disease, unspecified: Secondary | ICD-10-CM

## 2018-04-08 DIAGNOSIS — N183 Chronic kidney disease, stage 3 unspecified: Secondary | ICD-10-CM | POA: Diagnosis present

## 2018-04-08 DIAGNOSIS — I5030 Unspecified diastolic (congestive) heart failure: Secondary | ICD-10-CM

## 2018-04-08 DIAGNOSIS — R739 Hyperglycemia, unspecified: Secondary | ICD-10-CM | POA: Diagnosis present

## 2018-04-08 DIAGNOSIS — I10 Essential (primary) hypertension: Secondary | ICD-10-CM | POA: Diagnosis present

## 2018-04-08 DIAGNOSIS — F17200 Nicotine dependence, unspecified, uncomplicated: Secondary | ICD-10-CM

## 2018-04-08 DIAGNOSIS — I509 Heart failure, unspecified: Secondary | ICD-10-CM | POA: Insufficient documentation

## 2018-04-08 DIAGNOSIS — F310 Bipolar disorder, current episode hypomanic: Secondary | ICD-10-CM | POA: Diagnosis not present

## 2018-04-08 DIAGNOSIS — R0602 Shortness of breath: Secondary | ICD-10-CM | POA: Diagnosis not present

## 2018-04-08 DIAGNOSIS — R0600 Dyspnea, unspecified: Secondary | ICD-10-CM | POA: Diagnosis present

## 2018-04-08 DIAGNOSIS — F319 Bipolar disorder, unspecified: Secondary | ICD-10-CM | POA: Diagnosis present

## 2018-04-08 LAB — CBC WITH DIFFERENTIAL/PLATELET
Abs Immature Granulocytes: 0 10*3/uL (ref 0.0–0.1)
Basophils Absolute: 0.1 10*3/uL (ref 0.0–0.1)
Basophils Relative: 1 %
EOS ABS: 0.2 10*3/uL (ref 0.0–0.7)
EOS PCT: 3 %
HEMATOCRIT: 38.1 % (ref 36.0–46.0)
Hemoglobin: 11.5 g/dL — ABNORMAL LOW (ref 12.0–15.0)
Immature Granulocytes: 0 %
LYMPHS ABS: 2.9 10*3/uL (ref 0.7–4.0)
Lymphocytes Relative: 43 %
MCH: 24.6 pg — ABNORMAL LOW (ref 26.0–34.0)
MCHC: 30.2 g/dL (ref 30.0–36.0)
MCV: 81.4 fL (ref 78.0–100.0)
MONOS PCT: 9 %
Monocytes Absolute: 0.6 10*3/uL (ref 0.1–1.0)
Neutro Abs: 3.1 10*3/uL (ref 1.7–7.7)
Neutrophils Relative %: 44 %
Platelets: 302 10*3/uL (ref 150–400)
RBC: 4.68 MIL/uL (ref 3.87–5.11)
RDW: 16.2 % — AB (ref 11.5–15.5)
WBC: 6.9 10*3/uL (ref 4.0–10.5)

## 2018-04-08 LAB — ECHOCARDIOGRAM COMPLETE
Height: 67 in
WEIGHTICAEL: 2723.2 [oz_av]

## 2018-04-08 LAB — HEMOGLOBIN A1C
Hgb A1c MFr Bld: 6.3 % — ABNORMAL HIGH (ref 4.8–5.6)
Mean Plasma Glucose: 134.11 mg/dL

## 2018-04-08 LAB — BRAIN NATRIURETIC PEPTIDE: B NATRIURETIC PEPTIDE 5: 421.1 pg/mL — AB (ref 0.0–100.0)

## 2018-04-08 LAB — D-DIMER, QUANTITATIVE: D-Dimer, Quant: 0.76 ug/mL-FEU — ABNORMAL HIGH (ref 0.00–0.50)

## 2018-04-08 LAB — TROPONIN I: Troponin I: <0.03 ng/mL (ref <0.03)

## 2018-04-08 MED ORDER — OXYCODONE HCL 5 MG PO TABS
5.0000 mg | ORAL_TABLET | Freq: Three times a day (TID) | ORAL | Status: DC | PRN
Start: 2018-04-08 — End: 2018-04-09

## 2018-04-08 MED ORDER — AMITRIPTYLINE HCL 50 MG PO TABS
50.0000 mg | ORAL_TABLET | Freq: Every day | ORAL | Status: DC
  Administered 2018-04-08: 50 mg via ORAL
  Filled 2018-04-08 (×2): qty 1

## 2018-04-08 MED ORDER — ACETAMINOPHEN 325 MG PO TABS
650.0000 mg | ORAL_TABLET | ORAL | Status: DC | PRN
  Administered 2018-04-08 – 2018-04-09 (×3): 650 mg via ORAL
  Filled 2018-04-08 (×3): qty 2

## 2018-04-08 MED ORDER — ENOXAPARIN SODIUM 40 MG/0.4ML ~~LOC~~ SOLN
40.0000 mg | SUBCUTANEOUS | Status: DC
  Administered 2018-04-08 – 2018-04-09 (×2): 40 mg via SUBCUTANEOUS
  Filled 2018-04-08 (×2): qty 0.4

## 2018-04-08 MED ORDER — SERTRALINE HCL 100 MG PO TABS
100.0000 mg | ORAL_TABLET | Freq: Every day | ORAL | Status: DC
  Administered 2018-04-08 – 2018-04-09 (×2): 100 mg via ORAL
  Filled 2018-04-08 (×2): qty 1

## 2018-04-08 MED ORDER — SODIUM CHLORIDE 0.9% FLUSH: 3.0000 mL | INTRAVENOUS | Status: DC | PRN

## 2018-04-08 MED ORDER — SODIUM CHLORIDE 0.9% FLUSH
3.0000 mL | Freq: Two times a day (BID) | INTRAVENOUS | Status: DC
  Administered 2018-04-08 – 2018-04-09 (×3): 3 mL via INTRAVENOUS

## 2018-04-08 MED ORDER — ONDANSETRON HCL 4 MG/2ML IJ SOLN: 4.0000 mg | Freq: Four times a day (QID) | INTRAMUSCULAR | Status: DC | PRN

## 2018-04-08 MED ORDER — POTASSIUM CHLORIDE CRYS ER 20 MEQ PO TBCR
40.0000 meq | EXTENDED_RELEASE_TABLET | Freq: Once | ORAL | Status: AC
  Administered 2018-04-08: 40 meq via ORAL
  Filled 2018-04-08: qty 2

## 2018-04-08 MED ORDER — ASPIRIN EC 81 MG PO TBEC
81.0000 mg | DELAYED_RELEASE_TABLET | Freq: Every day | ORAL | Status: DC
  Administered 2018-04-08 – 2018-04-09 (×2): 81 mg via ORAL
  Filled 2018-04-08 (×2): qty 1

## 2018-04-08 MED ORDER — LISINOPRIL 10 MG PO TABS
10.0000 mg | ORAL_TABLET | Freq: Every day | ORAL | Status: DC
  Administered 2018-04-08 – 2018-04-09 (×2): 10 mg via ORAL
  Filled 2018-04-08 (×2): qty 1

## 2018-04-08 MED ORDER — OXYCODONE-ACETAMINOPHEN 10-325 MG PO TABS: 1.0000 | ORAL_TABLET | Freq: Three times a day (TID) | ORAL | Status: DC | PRN

## 2018-04-08 MED ORDER — ALPRAZOLAM 0.5 MG PO TABS: 1.0000 mg | ORAL_TABLET | Freq: Every evening | ORAL | Status: DC | PRN

## 2018-04-08 MED ORDER — OXYCODONE-ACETAMINOPHEN 5-325 MG PO TABS: 1.0000 | ORAL_TABLET | Freq: Three times a day (TID) | ORAL | Status: DC | PRN

## 2018-04-08 MED ORDER — METOPROLOL SUCCINATE ER 25 MG PO TB24
12.5000 mg | ORAL_TABLET | Freq: Every day | ORAL | Status: DC
  Administered 2018-04-08 – 2018-04-09 (×2): 12.5 mg via ORAL
  Filled 2018-04-08 (×2): qty 1

## 2018-04-08 MED ORDER — IPRATROPIUM-ALBUTEROL 0.5-2.5 (3) MG/3ML IN SOLN: 3.0000 mL | Freq: Four times a day (QID) | RESPIRATORY_TRACT | Status: DC | PRN

## 2018-04-08 MED ORDER — PERFLUTREN LIPID MICROSPHERE
1.0000 mL | INTRAVENOUS | Status: AC | PRN
  Administered 2018-04-08: 2 mL via INTRAVENOUS
  Filled 2018-04-08: qty 10

## 2018-04-08 MED ORDER — SODIUM CHLORIDE 0.9 % IV SOLN: 250.0000 mL | INTRAVENOUS | Status: DC | PRN

## 2018-04-08 MED ORDER — QUETIAPINE FUMARATE 25 MG PO TABS
200.0000 mg | ORAL_TABLET | Freq: Two times a day (BID) | ORAL | Status: DC
  Administered 2018-04-08 – 2018-04-09 (×3): 200 mg via ORAL
  Filled 2018-04-08 (×3): qty 8

## 2018-04-08 MED ORDER — FUROSEMIDE 10 MG/ML IJ SOLN
20.0000 mg | Freq: Once | INTRAMUSCULAR | Status: AC
  Administered 2018-04-08: 20 mg via INTRAVENOUS
  Filled 2018-04-08: qty 2

## 2018-04-08 MED ORDER — IOPAMIDOL (ISOVUE-370) INJECTION 76%
100.0000 mL | Freq: Once | INTRAVENOUS | Status: AC | PRN
  Administered 2018-04-08: 100 mL via INTRAVENOUS

## 2018-04-08 MED ORDER — NICOTINE 7 MG/24HR TD PT24
7.0000 mg | MEDICATED_PATCH | Freq: Every day | TRANSDERMAL | Status: DC
  Administered 2018-04-08 – 2018-04-09 (×2): 7 mg via TRANSDERMAL
  Filled 2018-04-08 (×2): qty 1

## 2018-04-08 NOTE — Progress Notes (Signed)
Cardiology [facility] Note  Called by the ED to help facilitate scheduling outpatient follow-up for this patient, who is a 72 year old female who presents with shortness of breath.  She notably has no known history of heart failure or ischemic heart disease.  Her ED work-up reassuringly reveals negative cardiac troponin and a CT angiogram that is negative for PE.  Her BNP is elevated to 421 and her ECG reveals sinus rhythm with a left bundle branch block which is similar to her prior ECGs.  Her symptoms resolved without intervention in the ED and it was felt that the patient was stable for discharge with outpatient follow-up.  A copy of this note will be sent to the on-call attending cardiologist, Dr. Fransico Him, to assist with scheduling short interval outpatient follow-up.  Marcie Mowers, MD Cardiology Fellow, PGY-6

## 2018-04-08 NOTE — ED Notes (Signed)
She feels better

## 2018-04-08 NOTE — H&P (Signed)
History and Physical    Via Rosado WVP:[id number] DOB: July 11, 1946 DOA: 04/07/2018  PCP: Harvie Junior, MD Consultants:  None Patient coming from:  Home - lives with husband; NOK: husband  Chief Complaint: SOB  HPI: Devora [name] is a 72 y.o. female with medical history significant of HTN and bipolar d/o presenting with SOB.  She thought she had a heart attack. "It hurt, bad.  I know I was hyperventilating.  They gave me an Ativan downstairs and my breathing eased up some.  I get up, walk 2 steps, and I'm so dizzy I have to sit back down.  The doctor down there said he wanted me to see a cardiologist.  Anything, I won't be doing nothing, I was doing it downstairs, I just break out into a cold sweat."  +chest pain, Ieft axilla and shoulder.  Non-exertional, she was in bed, "having an anxiety attack."  She does have panic attacks.  All day yesterday she felt SOB and she has been for 2-3 days.  After walking to the kitchen and putting something on the stove, she has to sit down and wait until she can walk again.  She has lost 14 pounds, unintentional, over the last 3 months.  No LE edema (other than chronic from ankle fracture in 4/19).     ED Course:  Carryover, per Dr. Kathrynn Humble: [phone]  CT does not show any PE. Both x-ray and CT does not show any evidence of pulmonary edema either. His BNP is elevated, in the setting of her stating that she is having some orthopnea and exertional dyspnea -new diagnosis of CHF is still a possibility. Ambulatory pulse ox has been ordered. I spoke with Dr. Neena Rhymes, cardiology. He thinks that since the x-rays are not showing any significant edema, patient is O2 sats at rest is normal, if patient's amatory pulse ox is fine then she can be ideally seen as an outpatient. He will put in a note in the epic for a prompt follow-up.  If patient is able to ambulate well without problems we will discharge her.  Otherwise she will need medicine admission, and they can consult  cardiology if needed.   Review of Systems: As per HPI; otherwise review of systems reviewed and negative.   Ambulatory Status:  Ambulates without assistance  Past Medical History:  Diagnosis Date  . Arthritis   . Bipolar disorder ([place])   . COPD (chronic obstructive pulmonary disease) ([place])   . Fracture, ankle 12/25/2017   closed right ankle fracture  . History of kidney stones   . Hypertension     Past Surgical History:  Procedure Laterality Date  . bladder tack    . BREAST LUMPECTOMY    . CARPAL TUNNEL RELEASE Bilateral   . CHOLECYSTECTOMY    . LAMINECTOMY     neck  . ORIF ANKLE FRACTURE Right 12/26/2017   Procedure: OPEN REDUCTION INTERNAL FIXATION (ORIF) RIGHT BIMALLEOLAR ANKLE FRACTURE;  Surgeon: Marybelle Killings, MD;  Location: [facility];  Service: Orthopedics;  Laterality: Right;  . RADICAL HYSTERECTOMY    . spinal fusion      Social History   Socioeconomic History  . Marital status: Widowed    Spouse name: Not on file  . Number of children: Not on file  . Years of education: Not on file  . Highest education level: Not on file  Occupational History  . Occupation: retired  Scientific laboratory technician  . Financial resource strain: Not on file  . Food insecurity:  Worry: Not on file    Inability: Not on file  . Transportation needs:    Medical: Not on file    Non-medical: Not on file  Tobacco Use  . Smoking status: Current Every Day Smoker    Packs/day: 0.50    Years: 56.00    Pack years: 28.00    Types: Cigarettes  . Smokeless tobacco: Never Used  . Tobacco comment: down to 3 cigarettes/day  Substance and Sexual Activity  . Alcohol use: No  . Drug use: No  . Sexual activity: Not on file  Lifestyle  . Physical activity:    Days per week: Not on file    Minutes per session: Not on file  . Stress: Not on file  Relationships  . Social connections:    Talks on phone: Not on file    Gets together: Not on file    Attends religious service: Not on file    Active member  of club or organization: Not on file    Attends meetings of clubs or organizations: Not on file    Relationship status: Not on file  . Intimate partner violence:    Fear of current or ex partner: Not on file    Emotionally abused: Not on file    Physically abused: Not on file    Forced sexual activity: Not on file  Other Topics Concern  . Not on file  Social History Narrative  . Not on file    Allergies  Allergen Reactions  . Penicillins Other (See Comments)    Has patient had a PCN reaction causing immediate rash, facial/tongue/throat swelling, SOB or lightheadedness with hypotension: Yes Has patient had a PCN reaction causing severe rash involving mucus membranes or skin necrosis: Yes Has patient had a PCN reaction that required hospitalization Yes Has patient had a PCN reaction occurring within the last 10 years: No If all of the above answers are "NO", then may proceed with Cephalosporin use.     Family History  Problem Relation Age of Onset  . Heart failure Neg Hx   . CAD Neg Hx     Prior to Admission medications   Medication Sig Start Date End Date Taking? Authorizing Provider  ALPRAZolam Duanne Moron) 1 MG tablet Take 1 mg by mouth at bedtime as needed for anxiety.   Yes [provider]  amitriptyline (ELAVIL) 50 MG tablet Take 50 mg by mouth at bedtime. 03/04/18  Yes [provider]  lisinopril (PRINIVIL,ZESTRIL) 10 MG tablet Take 10 mg by mouth daily.   Yes [provider]  oxyCODONE-acetaminophen (PERCOCET) 10-325 MG tablet Take 1 tablet by mouth every 8 (eight) hours as needed for pain. 01/15/18  Yes [provider]  QUEtiapine (SEROQUEL) 200 MG tablet Take 1 tablet (200 mg total) by mouth 2 (two) times daily. 09/04/16  Yes Domenic Moras, PA-C  sertraline (ZOLOFT) 100 MG tablet Take 100 mg by mouth daily.   Yes [provider]  aspirin EC 325 MG tablet Take 1 tablet (325 mg total) by mouth daily. Patient not taking: Reported on  04/08/2018 12/27/17 12/27/18  Lanae Crumbly, PA-C  doxycycline (VIBRAMYCIN) 100 MG capsule Take 1 capsule (100 mg total) by mouth 2 (two) times daily. Patient not taking: Reported on 04/08/2018 02/04/18   Blanchie Dessert, MD  HYDROcodone-acetaminophen (NORCO/VICODIN) 5-325 MG tablet Take 1-2 tablets by mouth every 6 (six) hours as needed for moderate pain. Patient not taking: Reported on 04/08/2018 12/27/17   Lanae Crumbly, PA-C  Physical Exam: Vitals:   04/08/18 0600 04/08/18 0630 04/08/18 0700 04/08/18 0756  BP: 116/60 (!) 117/56 118/60 (!) 143/72  Pulse: 92 85 85 89  Resp: 19 (!) 29 (!) 22 18  Temp:    98.9 F (37.2 C)  TempSrc:    Oral  SpO2: 97% 96% 95% 97%  Weight:    77.2 kg (170 lb 3.2 oz)  Height:    5\' 7"  (1.702 m)     General:  Appears calm and comfortable and is NAD on RA Eyes:  PERRL, EOMI, normal lids, iris ENT:  grossly normal hearing, lips & tongue, mmm Neck:  no LAD, masses or thyromegaly; no carotid bruits Cardiovascular:  RRR, no m/r/g. No LE edema.  Respiratory:   Scattered bibasilar rhonchi vs. crackles.  Normal respiratory effort. Abdomen:  soft, NT, ND, NABS Back:   normal alignment, no CVAT Skin:  no rash or induration seen on limited exam Musculoskeletal:  grossly normal tone BUE/BLE, good ROM, no bony abnormality; mild chronic R ankle edema Psychiatric:  Mildly anxious mood and affect, speech fluent and appropriate, AOx3 Neurologic:  CN 2-12 grossly intact, moves all extremities in coordinated fashion, sensation intact    Radiological Exams on Admission: Dg Chest 2 View  Result Date: 04/07/2018 CLINICAL DATA:  Shortness of breath. EXAM: CHEST - 2 VIEW COMPARISON:  Radiograph 12/29/1990 FINDINGS: The cardiomediastinal contours are unchanged, mild cardiomegaly. Again seen calcified granuloma is in the right lung with calcified mediastinal and right hilar nodes. Pulmonary vasculature is normal. No consolidation, pleural effusion, or pneumothorax. No acute  osseous abnormalities are seen. Spinal stimulator in place with leads projecting posterior to approximately T9. IMPRESSION: 1. Unchanged mild cardiomegaly without acute chest finding. 2. Prior granulomatous disease. Electronically Signed   By: Jeb Levering M.D.   On: 04/07/2018 22:59   Ct Angio Chest Pe W And/or Wo Contrast  Result Date: 04/08/2018 CLINICAL DATA:  Shortness of breath, chest pain. History of hypertension. Assess for pulmonary embolism. EXAM: CT ANGIOGRAPHY CHEST WITH CONTRAST TECHNIQUE: Multidetector CT imaging of the chest was performed using the standard protocol during bolus administration of intravenous contrast. Multiplanar CT image reconstructions and MIPs were obtained to evaluate the vascular anatomy. CONTRAST:  56 mL ISOVUE-370 IOPAMIDOL (ISOVUE-370) INJECTION 76% COMPARISON:  Chest radiograph April 07, 2018 FINDINGS: CARDIOVASCULAR: Adequate contrast opacification of the pulmonary artery's. Main pulmonary artery is not enlarged. No pulmonary arterial filling defects to the level of the subsegmental branches. Heart is mildly enlarged. Minimal pericardial thickening. Trace coronary artery calcifications. No pericardial effusion. Thoracic aorta is normal course and caliber, mild calcific atherosclerosis. MEDIASTINUM/NODES: No lymphadenopathy by CT size criteria. Calcified mediastinal and RIGHT hilar lymph nodes. LUNGS/PLEURA: Tracheobronchial tree is patent, no pneumothorax. Mild bronchial wall thickening. No pleural effusions, focal consolidations, pulmonary nodules or masses. Scattered calcified granulomas. Mild centrilobular emphysema. LEFT lung base atelectasis. UPPER ABDOMEN: Calcified hepatosplenic granulomas. Numerous calcifications RIGHT kidney measuring to 3 mm. Status post cholecystectomy. MUSCULOSKELETAL: Nonacute. Coarse breast calcifications. Spinal stimulator leads via LEFT T12-L1 interlaminar approach with distal tip at T9. Broad dextroscoliosis could be positional.  Review of the MIP images confirms the above findings. IMPRESSION: 1. No acute pulmonary embolism. 2. Mild bronchial thickening seen with bronchitis or reactive airways disease. No pneumonia. 3. Mild cardiomegaly.  Old granulomatous disease. 4. Partially imaged RIGHT nephrolithiasis. Aortic Atherosclerosis (ICD10-I70.0) and Emphysema (ICD10-J43.9). Electronically Signed   By: Elon Alas M.D.   On: 04/08/2018 02:19    EKG: Independently reviewed.  Sinus tachycardia with  rate 104; LAE, LBBB   Labs on Admission: I have personally reviewed the available labs and imaging studies at the time of the admission.  Pertinent labs:   K+ 3.0 CO2 21 Glucose 125 BUN 15/Creatinine 1.13/GFR 55 - stable/improved from 5/19 BNP 421.1 Troponin <0.03 Essentially normal CBC D-dimer 0.76   Assessment/Plan Principal Problem:   Dyspnea Active Problems:   Essential hypertension   COPD (chronic obstructive pulmonary disease) ([facility])   Bipolar disorder ([facility])   CKD (chronic kidney disease) stage 3, GFR 30-59 ml/min ([facility])   Hyperglycemia   Tobacco dependence   Dyspnea -Patient presenting with dyspnea as well as other symptoms -She has a h/o COPD (see below) and so was found to be 90-91% on room air with ambulation; could be related to this -She also has a h/o bipolar with anxiety and believed she was having panic attacks which could also have been contributing to her SOB -However, she was found to have an elevated BNP  -CXR and chest CT negative for pulmonary edema -Fairly low suspicion for significant CHF, but with dyspnea and mild hypoxia she was placed in observation for ongoing monitoring -The cardiology fellow saw the patient in the ER and recommends outpatient f/u -Will monitor on telemetry -Will order Echo - if normal or minimally abnormal, will plan outpatient f/u; if abnormal, will plan for inpatient cardiology evaluation -She is already on an ACE -Will also add a beta blocker, which may help  with BP and with anxiety  HTN -Continue Lisinopril -Add low-dose Toprol XL  COPD with ongoing tobacco dependence -Will add prn Duonebs -Suspect that her mild hypoxia is related to 60+ pack-years of tobacco use -She does not appear to qualify for home O2 at this time -Tobacco Dependence: encourage cessation.  This was discussed with the patient and should be reviewed on an ongoing basis.   -Patch ordered at patient request.  Bipolar d/o -She is on a multitude of medications for this issue -Continue: Xanax, Elavil, Seroquel, and Zoloft -Suspect that chronic anxiety is also contributing to her dyspnea  CKD -Likely related to long-standing HTN -Appears to be stable at this time  Hyperglycemia -Will check A1c    DVT prophylaxis:  Lovenox  Code Status: Full - confirmed with patient Family Communication: None present Disposition Plan:  Home once clinically improved Consults called: CM, PT  Admission status: It is my clinical opinion that referral for OBSERVATION is reasonable and necessary in this patient based on the above information provided. The aforementioned taken together are felt to place the patient at high risk for further clinical deterioration. However it is anticipated that the patient may be medically stable for discharge from the hospital within 24 to 48 hours.    Karmen Bongo MD [facility]  If note is complete, please contact covering daytime or nighttime physician. [email or web address] Password [email or web address]  04/08/2018, 9:03 AM

## 2018-04-08 NOTE — ED Notes (Signed)
Med given pt c/o gas

## 2018-04-08 NOTE — Progress Notes (Addendum)
Echocardiogram 2D Echocardiogram with Definity has been performed.  04/08/2018 3:51 PM Maudry Mayhew, BS, RVT, RDCS, RDMS

## 2018-04-08 NOTE — ED Notes (Signed)
Report given 3E RN

## 2018-04-08 NOTE — Consult Note (Addendum)
CARDIOLOGY CONSULT NOTE  Patient ID: Maria [name] MRN: [id number] DOB/AGE: 1946/03/01 72 y.o.  Admit date: 04/07/2018 Primary Physician Harvie Junior, MD Primary Cardiologist New Chief Complaint  Dyspnea Requesting  Dr. Karmen Bongo  HPI:   She reported to the ED with SOB.  She said that this developed suddenly over the last day or so.  She has COPD but does not report severe chronic dyspnea.  She does not have any routine active lifestyle but with walking around the house denies SOB.  She says that on the day of presentation she had dizziness, flushing, sweating and felt her heart racing.  She does report some chest pain like something sitting on her chest.  However, this was not necessarily with the acute SOB.  This has been happening for some time.  She thinks that this happens at rest.  She did become panicked and very anxious when she started having increased SOB.   She came the the ED.  In the ED she had a CT without evidence of PE or edema.  She does have a history of COPD and she had sats of 90 - 91% on RA.  BNP was elevated mildly.  Cardiac enzymes are negative.  Of note EKG with LBBB.  The only previous was also LBBB in April.     Past Medical History:  Diagnosis Date  . Arthritis   . Bipolar disorder ([place])   . COPD (chronic obstructive pulmonary disease) ([place])   . Fracture, ankle 12/25/2017   closed right ankle fracture  . History of kidney stones   . Hypertension     Past Surgical History:  Procedure Laterality Date  . bladder tack    . BREAST LUMPECTOMY    . CARPAL TUNNEL RELEASE Bilateral   . CHOLECYSTECTOMY    . LAMINECTOMY     neck  . ORIF ANKLE FRACTURE Right 12/26/2017   Procedure: OPEN REDUCTION INTERNAL FIXATION (ORIF) RIGHT BIMALLEOLAR ANKLE FRACTURE;  Surgeon: Marybelle Killings, MD;  Location: [place];  Service: Orthopedics;  Laterality: Right;  . RADICAL HYSTERECTOMY    . spinal fusion      Allergies  Allergen Reactions  . Penicillins Other (See  Comments)    Has patient had a PCN reaction causing immediate rash, facial/tongue/throat swelling, SOB or lightheadedness with hypotension: Yes Has patient had a PCN reaction causing severe rash involving mucus membranes or skin necrosis: Yes Has patient had a PCN reaction that required hospitalization Yes Has patient had a PCN reaction occurring within the last 10 years: No If all of the above answers are "NO", then may proceed with Cephalosporin use.    Medications Prior to Admission  Medication Sig Dispense Refill Last Dose  . ALPRAZolam (XANAX) 1 MG tablet Take 1 mg by mouth at bedtime as needed for anxiety.   Past Week at Unknown time  . amitriptyline (ELAVIL) 50 MG tablet Take 50 mg by mouth at bedtime.  5 04/07/2018 at Unknown time  . lisinopril (PRINIVIL,ZESTRIL) 10 MG tablet Take 10 mg by mouth daily.   04/07/2018 at Unknown time  . oxyCODONE-acetaminophen (PERCOCET) 10-325 MG tablet Take 1 tablet by mouth every 8 (eight) hours as needed for pain.  0 Past Month at Unknown time  . QUEtiapine (SEROQUEL) 200 MG tablet Take 1 tablet (200 mg total) by mouth 2 (two) times daily. 10 tablet 0 04/07/2018 at Unknown time  . sertraline (ZOLOFT) 100 MG tablet Take 100 mg by mouth daily.   04/07/2018 at  Unknown time   Family History  Problem Relation Age of Onset  . Heart failure Neg Hx   . CAD Neg Hx     Social History   Socioeconomic History  . Marital status: Widowed    Spouse name: Not on file  . Number of children: Not on file  . Years of education: Not on file  . Highest education level: Not on file  Occupational History  . Occupation: retired  Scientific laboratory technician  . Financial resource strain: Not on file  . Food insecurity:    Worry: Not on file    Inability: Not on file  . Transportation needs:    Medical: Not on file    Non-medical: Not on file  Tobacco Use  . Smoking status: Current Every Day Smoker    Packs/day: 0.50    Years: 56.00    Pack years: 28.00    Types: Cigarettes    . Smokeless tobacco: Never Used  . Tobacco comment: down to 3 cigarettes/day  Substance and Sexual Activity  . Alcohol use: No  . Drug use: No  . Sexual activity: Not on file  Lifestyle  . Physical activity:    Days per week: Not on file    Minutes per session: Not on file  . Stress: Not on file  Relationships  . Social connections:    Talks on phone: Not on file    Gets together: Not on file    Attends religious service: Not on file    Active member of club or organization: Not on file    Attends meetings of clubs or organizations: Not on file    Relationship status: Not on file  . Intimate partner violence:    Fear of current or ex partner: Not on file    Emotionally abused: Not on file    Physically abused: Not on file    Forced sexual activity: Not on file  Other Topics Concern  . Not on file  Social History Narrative  . Not on file     ROS:  Panic, 14 pound unintentional weight loss over 3 months.  Otherwise as stated in the HPI and negative for all other systems.  Physical Exam: Blood pressure (!) 97/55, pulse 90, temperature 98.2 F (36.8 C), temperature source Oral, resp. rate 18, height 5\' 7"  (1.702 m), weight 170 lb 3.2 oz (77.2 kg), SpO2 93 %.  GENERAL:  Well appearing HEENT:  Pupils equal round and reactive, fundi not visualized, oral mucosa unremarkable NECK:  No jugular venous distention, waveform within normal limits, carotid upstroke brisk and symmetric, no bruits, no thyromegaly LYMPHATICS:  No cervical, inguinal adenopathy LUNGS:  Decreased breath sounds BACK:  No CVA tenderness CHEST:  Unremarkable HEART:  PMI not displaced or sustained,S1 and S2 within normal limits, no S3, no S4, no clicks, no rubs, no murmurs ABD:  Flat, positive bowel sounds normal in frequency in pitch, no bruits, no rebound, no guarding, no midline pulsatile mass, no hepatomegaly, no splenomegaly EXT:  2 plus pulses throughout, no edema, no cyanosis no clubbing SKIN:  No rashes  no nodules NEURO:  Cranial nerves II through XII grossly intact, motor grossly intact throughout PSYCH:  Cognitively intact, oriented to person place and time   Labs: Lab Results  Component Value Date   BUN 15 04/07/2018   Lab Results  Component Value Date   CREATININE 1.13 (H) 04/07/2018   Lab Results  Component Value Date   NA 138 04/07/2018  K 3.0 (L) 04/07/2018   CL 107 04/07/2018   CO2 21 (L) 04/07/2018   Lab Results  Component Value Date   TROPONINI <0.03 04/08/2018   Lab Results  Component Value Date   WBC 8.2 04/07/2018   HGB 12.2 04/07/2018   HCT 40.8 04/07/2018   MCV 81.3 04/07/2018   PLT 290 04/07/2018   No results found for: CHOL, HDL, LDLCALC, LDLDIRECT, TRIG, CHOLHDL Lab Results  Component Value Date   ALT 21 12/25/2017   AST 17 12/25/2017   ALKPHOS 122 12/25/2017   BILITOT 0.5 12/25/2017   Echo:  Study Conclusions  - Left ventricle: The cavity size was normal. Systolic function   overall appears reduced Images were inadequate for LV wall motion   assessment even with definity There was an increased relative   contribution of atrial contraction to ventricular filling.   Doppler parameters are consistent with abnormal left ventricular   relaxation (grade 1 diastolic dysfunction). Doppler parameters   are consistent with high ventricular filling pressure. - Aortic valve: Poorly visualized. - Aorta: The aorta was poorly visualized. - Mitral valve: Poorly visualized. - Pulmonic valve: Poorly visualized. - Pulmonary arteries: Systolic pressure could not be accurately   estimated.  Impressions:  - Very poor acoustical windows preclude accurate assessment of   EF(even with definity contrast), wall motion and valvular   function. Recommend cardiac MRI.  Radiology:   CXR: FINDINGS: The cardiomediastinal contours are unchanged, mild cardiomegaly. Again seen calcified granuloma is in the right lung with calcified mediastinal and right hilar  nodes. Pulmonary vasculature is normal. No consolidation, pleural effusion, or pneumothorax. No acute osseous abnormalities are seen. Spinal stimulator in place with leads projecting posterior to approximately T9.  EKG:  Sinus tachycardia, LBBB.  LBBB present on EKG in April   04/08/2018  ASSESSMENT AND PLAN:   DYSPNEA:  I do not strongly suspect that this is cardiac.  However, I cannot assess her EF as above.  I suspect diastolic dysfunction.  She has an abnormal EKG (which is not acute).  I would like to screen for ischemia.  She would not be able to walk on a treadmill.  I will send her for a Lexiscan Myoview.    HTN:  I suspect hypertensive heart disease.  I might consider an out patient MRI but will start with testing as above.   TOBACCO ABUSE:  She is down to three cigs per day and knows that she needs to quit.         SignedMinus Breeding 04/08/2018, 5:23 PM

## 2018-04-08 NOTE — ED Provider Notes (Addendum)
[facility] EMERGENCY DEPARTMENT Provider Note   CSN: [id number] Arrival date & time: 04/07/18  2216     History   Chief Complaint Chief Complaint  Patient presents with  . Shortness of Breath    HPI Maria [name] is a 72 y.o. female.  HPI  72 year old female comes in with chief complaint of shortness of breath.  Patient has history of hypertension, bipolar disorder and 3 months ago she had ORIF status post ankle injury.  Patient also reports history of anxiety and states that over the past 3 days she has been having worsening shortness of breath.  Patient has been noticing shortness of breath when she is laying flat or when she is exerting herself.  Patient denies any history of chest pain, new cough, new wheezing.    With previous anxiety, patient feels like things are crawling on her.  Past Medical History:  Diagnosis Date  . Arthritis   . Bipolar disorder ([place])   . Fracture, ankle 12/25/2017   closed right ankle fracture  . History of kidney stones   . Hypertension     Patient Active Problem List   Diagnosis Date Noted  . CHF (congestive heart failure) ([place]) 04/08/2018  . Closed right ankle fracture 12/25/2017    Past Surgical History:  Procedure Laterality Date  . bladder tack    . BREAST LUMPECTOMY    . CARPAL TUNNEL RELEASE Bilateral   . CHOLECYSTECTOMY    . LAMINECTOMY     neck  . ORIF ANKLE FRACTURE Right 12/26/2017   Procedure: OPEN REDUCTION INTERNAL FIXATION (ORIF) RIGHT BIMALLEOLAR ANKLE FRACTURE;  Surgeon: Marybelle Killings, MD;  Location: [facility];  Service: Orthopedics;  Laterality: Right;  . RADICAL HYSTERECTOMY    . spinal fusion       OB History   None      Home Medications    Prior to Admission medications   Medication Sig Start Date End Date Taking? Authorizing Provider  ALPRAZolam Duanne Moron) 1 MG tablet Take 1 mg by mouth at bedtime as needed for anxiety.   Yes [provider]  amitriptyline (ELAVIL) 50 MG tablet  Take 50 mg by mouth at bedtime. 03/04/18  Yes [provider]  lisinopril (PRINIVIL,ZESTRIL) 10 MG tablet Take 10 mg by mouth daily.   Yes [provider]  oxyCODONE-acetaminophen (PERCOCET) 10-325 MG tablet Take 1 tablet by mouth every 8 (eight) hours as needed for pain. 01/15/18  Yes [provider]  QUEtiapine (SEROQUEL) 200 MG tablet Take 1 tablet (200 mg total) by mouth 2 (two) times daily. 09/04/16  Yes Domenic Moras, PA-C  sertraline (ZOLOFT) 100 MG tablet Take 100 mg by mouth daily.   Yes [provider]  aspirin EC 325 MG tablet Take 1 tablet (325 mg total) by mouth daily. Patient not taking: Reported on 04/08/2018 12/27/17 12/27/18  Lanae Crumbly, PA-C  doxycycline (VIBRAMYCIN) 100 MG capsule Take 1 capsule (100 mg total) by mouth 2 (two) times daily. Patient not taking: Reported on 04/08/2018 02/04/18   Blanchie Dessert, MD  HYDROcodone-acetaminophen (NORCO/VICODIN) 5-325 MG tablet Take 1-2 tablets by mouth every 6 (six) hours as needed for moderate pain. Patient not taking: Reported on 04/08/2018 12/27/17   Lanae Crumbly, PA-C    Family History History reviewed. No pertinent family history.  Social History Social History   Tobacco Use  . Smoking status: Current Every Day Smoker    Packs/day: 0.50    Types: Cigarettes  . Smokeless  tobacco: Never Used  Substance Use Topics  . Alcohol use: No  . Drug use: No     Allergies   Penicillins   Review of Systems Review of Systems  Constitutional: Positive for activity change.  Respiratory: Positive for shortness of breath.   Cardiovascular: Negative for chest pain.  Gastrointestinal: Negative for nausea and vomiting.  Allergic/Immunologic: Negative for immunocompromised state.  Neurological: Negative for dizziness and syncope.  All other systems reviewed and are negative.    Physical Exam Updated Vital Signs BP (!) 143/72 (BP Location: Right Arm)   Pulse 89   Temp 98.9 F (37.2 C) (Oral)    Resp 18   Ht 5\' 7"  (1.702 m)   Wt 77.2 kg (170 lb 3.2 oz)   SpO2 97%   BMI 26.66 kg/m   Physical Exam  Constitutional: She is oriented to person, place, and time. She appears well-developed.  HENT:  Head: Normocephalic and atraumatic.  Eyes: EOM are normal.  Neck: Normal range of motion. Neck supple.  Cardiovascular: Normal rate.  Pulmonary/Chest: Effort normal and breath sounds normal. She has no wheezes. She has no rhonchi. She has no rales.  Abdominal: Bowel sounds are normal.  Musculoskeletal:       Right lower leg: She exhibits no edema.       Left lower leg: She exhibits no edema.  Neurological: She is alert and oriented to person, place, and time.  Skin: Skin is warm and dry.  Nursing note and vitals reviewed.    ED Treatments / Results  Labs (all labs ordered are listed, but only abnormal results are displayed) Labs Reviewed  BASIC METABOLIC PANEL - Abnormal; Notable for the following components:      Result Value   Potassium 3.0 (*)    CO2 21 (*)    Glucose, Bld 125 (*)    Creatinine, Ser 1.13 (*)    Calcium 8.6 (*)    GFR calc non Af Amer 48 (*)    GFR calc Af Amer 55 (*)    All other components within normal limits  CBC - Abnormal; Notable for the following components:   MCH 24.3 (*)    MCHC 29.9 (*)    RDW 15.9 (*)    All other components within normal limits  D-DIMER, QUANTITATIVE (NOT AT [facility]) - Abnormal; Notable for the following components:   D-Dimer, Quant 0.76 (*)    All other components within normal limits  BRAIN NATRIURETIC PEPTIDE - Abnormal; Notable for the following components:   B Natriuretic Peptide 421.1 (*)    All other components within normal limits  TROPONIN I  TROPONIN I    EKG EKG Interpretation  Date/Time:  Sunday April 07 2018 22:25:24 EDT Ventricular Rate:  104 PR Interval:    QRS Duration: 152 QT Interval:  413 QTC Calculation: 544 R Axis:   -17 Text Interpretation:  Sinus tachycardia Left atrial enlargement Left  bundle branch block Confirmed by Julianne Rice [id number]) on 04/07/2018 10:28:38 PM Also confirmed by Julianne Rice [phone]), editor Hattie Perch [phone])  on 04/08/2018 6:59:20 AM   Radiology Dg Chest 2 View  Result Date: 04/07/2018 CLINICAL DATA:  Shortness of breath. EXAM: CHEST - 2 VIEW COMPARISON:  Radiograph 12/29/1990 FINDINGS: The cardiomediastinal contours are unchanged, mild cardiomegaly. Again seen calcified granuloma is in the right lung with calcified mediastinal and right hilar nodes. Pulmonary vasculature is normal. No consolidation, pleural effusion, or pneumothorax. No acute osseous abnormalities are seen. Spinal stimulator in place with  leads projecting posterior to approximately T9. IMPRESSION: 1. Unchanged mild cardiomegaly without acute chest finding. 2. Prior granulomatous disease. Electronically Signed   By: Jeb Levering M.D.   On: 04/07/2018 22:59   Ct Angio Chest Pe W And/or Wo Contrast  Result Date: 04/08/2018 CLINICAL DATA:  Shortness of breath, chest pain. History of hypertension. Assess for pulmonary embolism. EXAM: CT ANGIOGRAPHY CHEST WITH CONTRAST TECHNIQUE: Multidetector CT imaging of the chest was performed using the standard protocol during bolus administration of intravenous contrast. Multiplanar CT image reconstructions and MIPs were obtained to evaluate the vascular anatomy. CONTRAST:  56 mL ISOVUE-370 IOPAMIDOL (ISOVUE-370) INJECTION 76% COMPARISON:  Chest radiograph April 07, 2018 FINDINGS: CARDIOVASCULAR: Adequate contrast opacification of the pulmonary artery's. Main pulmonary artery is not enlarged. No pulmonary arterial filling defects to the level of the subsegmental branches. Heart is mildly enlarged. Minimal pericardial thickening. Trace coronary artery calcifications. No pericardial effusion. Thoracic aorta is normal course and caliber, mild calcific atherosclerosis. MEDIASTINUM/NODES: No lymphadenopathy by CT size criteria. Calcified mediastinal and  RIGHT hilar lymph nodes. LUNGS/PLEURA: Tracheobronchial tree is patent, no pneumothorax. Mild bronchial wall thickening. No pleural effusions, focal consolidations, pulmonary nodules or masses. Scattered calcified granulomas. Mild centrilobular emphysema. LEFT lung base atelectasis. UPPER ABDOMEN: Calcified hepatosplenic granulomas. Numerous calcifications RIGHT kidney measuring to 3 mm. Status post cholecystectomy. MUSCULOSKELETAL: Nonacute. Coarse breast calcifications. Spinal stimulator leads via LEFT T12-L1 interlaminar approach with distal tip at T9. Broad dextroscoliosis could be positional. Review of the MIP images confirms the above findings. IMPRESSION: 1. No acute pulmonary embolism. 2. Mild bronchial thickening seen with bronchitis or reactive airways disease. No pneumonia. 3. Mild cardiomegaly.  Old granulomatous disease. 4. Partially imaged RIGHT nephrolithiasis. Aortic Atherosclerosis (ICD10-I70.0) and Emphysema (ICD10-J43.9). Electronically Signed   By: Elon Alas M.D.   On: 04/08/2018 02:19    Procedures Procedures (including critical care time)  Medications Ordered in ED Medications  LORazepam (ATIVAN) tablet 1 mg (1 mg Oral Given 04/08/18 0030)  potassium chloride SA (K-DUR,KLOR-CON) CR tablet 40 mEq (40 mEq Oral Given 04/08/18 0313)  iopamidol (ISOVUE-370) 76 % injection 100 mL (100 mLs Intravenous Contrast Given 04/08/18 0200)     Initial Impression / Assessment and Plan / ED Course  I have reviewed the triage vital signs and the nursing notes.  Pertinent labs & imaging results that were available during my care of the patient were reviewed by me and considered in my medical decision making (see chart for details).  Clinical Course as of Apr 09 819  Mon Apr 08, 2018  0326 CT does not show any PE.  Both x-ray and CT does not show any evidence of pulmonary edema either.  His BNP is elevated, in the setting of her stating that she is having some orthopnea and exertional  dyspnea  -new diagnosis of CHF is still a possibility.  Ambulatory pulse ox has been ordered.  I spoke with Dr. Neena Rhymes, cardiology.  He thinks that since the x-rays are not showing any significant edema, patient is O2 sats at rest is normal, if patient's amatory pulse ox is fine then she can be ideally seen as an outpatient.  He will put in a note in the epic for a prompt follow-up.  If patient is able to ambulate well without problems we will discharge her. Otherwise she will need medicine admission, and they can consult cardiology if needed.  CT Angio Chest PE W and/or Wo Contrast [AN]  0603 O2 sats dropped to 88 with minimal exertion.  We will admit.    [AN]    Clinical Course User Index [AN] Varney Biles, MD   72 year old female comes in with chief complaint of shortness of breath.  She has history of hypertension and recent ankle surgery.  She has no known history of CHF, CAD -questionable history of COPD is noted.  On lung exam however, patient is not wheezing.  Cardiac exam is also benign.  Chest x-ray is clear.  EKG is not showing any acute findings.  Given the recent surgery, PE is in the differential diagnosis.  We also considered ACS, CHF, COPD exacerbation, pneumonia, anxiety has other possible etiologies.  Final Clinical Impressions(s) / ED Diagnoses   Final diagnoses:  Shortness of breath  Elevated brain natriuretic peptide (BNP) level  Acute respiratory failure with hypoxia [facility])    ED Discharge Orders    None           Varney Biles, MD 04/08/18 [phone]

## 2018-04-08 NOTE — ED Notes (Addendum)
assit pt.to side of the bed .assit.in standing position pt.became very dizzy,assit pt.down to the side of bed,for 5 mins.stood pt.up again.pt.sats99% RA.walk pt to the  Door. And she had to sit down.    and back to bed sats was 90% RA.HR=104.PT.STATED SHE WAS SHORT OF BREATH and had to sat down.Marland Kitchen

## 2018-04-08 NOTE — Evaluation (Addendum)
Physical Therapy Evaluation Patient Details Name: Maria [name] MRN: [id number] DOB: May 24, 1946 Today's Date: 04/08/2018   History of Present Illness  Pt is a 72 y/o female admitted secondary to increased SOB. CT angio negative for acute abnormality. Chest imaging revealed unchanged cardiomegaly. PMH includes HTN, COPD, Bipolar disorder, tobacco use, back surgery, and ORIF of R ankle.   Clinical Impression  Pt admitted secondary to problem above with deficits below. Pt very limited secondary to dizziness and orthostatic BPs this session. Pt only able to tolerate short period in sitting secondary to dizziness. Attempted to take orthostatic BPs, however, pt had to lie back down prior to completion of seated BP. Required supervision for all bed mobility. Anticipate pt will progress well once dizziness improved, and pt reports her plan is to go home at d/c. However, if pt does not progress, may need short term SNF prior to return home to increase independence with mobility. Will continue to follow acutely to maximize functional mobility independence and safety.   Orthostatics:  Supine: 106/49 mmHg Sitting:  72/33 mmHg (pt had to lie down prior to completion of BP) Return to supine: 97/55 mmHg    Follow Up Recommendations Other (comment);Supervision for mobility/OOB(HHPT vs SNF )    Equipment Recommendations  3in1 (PT)    Recommendations for Other Services OT consult     Precautions / Restrictions Precautions Precautions: Fall;Other (comment) Precaution Comments: Watch BP  Restrictions Weight Bearing Restrictions: No Other Position/Activity Restrictions: Per pt, pt able to bear weight on RLE now.       Mobility  Bed Mobility Overal bed mobility: Needs Assistance Bed Mobility: Supine to Sit;Sit to Supine     Supine to sit: Supervision Sit to supine: Supervision   General bed mobility comments: Supervision for safety to sit at EOB. Pt with increased dizziness upon sitting. Unable to  tolerate sitting at EOB for BP to take, and had to lay down prior to BP completion. Supervision to return to supine.   Transfers                 General transfer comment: Unable, however, RN reports pt was able to stand and ambulate to bathroom prior to PT session.   Ambulation/Gait                Stairs            Wheelchair Mobility    Modified Rankin (Stroke Patients Only)       Balance Overall balance assessment: Needs assistance Sitting-balance support: No upper extremity supported;Feet supported Sitting balance-Leahy Scale: Fair                                       Pertinent Vitals/Pain Pain Assessment: No/denies pain    Home Living Family/patient expects to be discharged to:: Private residence Living Arrangements: Spouse/significant other Available Help at Discharge: Family;Available 24 hours/day Type of Home: House Home Access: Stairs to enter Entrance Stairs-Rails: None Entrance Stairs-Number of Steps: 1 Home Layout: One level Home Equipment: Walker - 2 wheels;Wheelchair - manual      Prior Function Level of Independence: Independent               Hand Dominance        Extremity/Trunk Assessment   Upper Extremity Assessment Upper Extremity Assessment: Defer to OT evaluation    Lower Extremity Assessment Lower Extremity Assessment: Generalized weakness  Cervical / Trunk Assessment Cervical / Trunk Assessment: Normal  Communication   Communication: No difficulties  Cognition Arousal/Alertness: Awake/alert Behavior During Therapy: WFL for tasks assessed/performed Overall Cognitive Status: Within Functional Limits for tasks assessed                                        General Comments General comments (skin integrity, edema, etc.): See vitals flowsheet for orthostatic BP.     Exercises     Assessment/Plan    PT Assessment Patient needs continued PT services  PT Problem List  Decreased strength;Decreased mobility;Decreased activity tolerance;Decreased balance;Decreased knowledge of use of DME;Decreased knowledge of precautions;Cardiopulmonary status limiting activity       PT Treatment Interventions DME instruction;Stair training;Gait training;Functional mobility training;Balance training;Therapeutic exercise;Therapeutic activities;Patient/family education    PT Goals (Current goals can be found in the Care Plan section)  Acute Rehab PT Goals Patient Stated Goal: "to feel better"  PT Goal Formulation: With patient Time For Goal Achievement: 04/22/18 Potential to Achieve Goals: Good    Frequency Min 3X/week   Barriers to discharge        Co-evaluation               AM-PAC PT "6 Clicks" Daily Activity  Outcome Measure Difficulty turning over in bed (including adjusting bedclothes, sheets and blankets)?: None Difficulty moving from lying on back to sitting on the side of the bed? : A Little Difficulty sitting down on and standing up from a chair with arms (e.g., wheelchair, bedside commode, etc,.)?: Unable Help needed moving to and from a bed to chair (including a wheelchair)?: A Lot Help needed walking in hospital room?: A Lot Help needed climbing 3-5 steps with a railing? : Total 6 Click Score: 13    End of Session Equipment Utilized During Treatment: Gait belt Activity Tolerance: Treatment limited secondary to medical complications (Comment)(orthostatics; dizziness ) Patient left: in bed;with call bell/phone within reach;with bed alarm set Nurse Communication: Mobility status PT Visit Diagnosis: Other abnormalities of gait and mobility (R26.89);Difficulty in walking, not elsewhere classified (R26.2);Muscle weakness (generalized) (M62.81)    Time: 3300-7622 PT Time Calculation (min) (ACUTE ONLY): 16 min   Charges:   PT Evaluation $PT Eval Moderate Complexity: 1 Mod          Leighton Ruff, PT, DPT  Acute Rehabilitation Services   Pager: [phone]   Rudean Hitt 04/08/2018, 12:30 PM

## 2018-04-08 NOTE — Progress Notes (Signed)
Called to get report from [facility] RN unable to give report at this time.Nurse to call back.

## 2018-04-08 NOTE — Care Management Note (Signed)
Case Management Note  Patient Details  Name: Maria [name] MRN: [id number] Date of Birth: [birth year]  Subjective/Objective:     CHF              Action/Plan: Patient lives at home with spouse; PCP: Harvie Junior, MD; has private insurance with [facility], [facility], [facility] Medicare with prescription drug coverage; noted paqtient was in a [facility] 12/2017; Physical Therapy to eval for home vs SNF at discharge; CM will continue to follow for progression of care.  Expected Discharge Date:  04/09/18               Expected Discharge Plan:  [place] vs short term SNF  In-House Referral:   [facility]  Discharge planning Services  CM Consult Status of Service:  In process, will continue to follow  Sherrilyn Rist [phone] 04/08/2018, 1:00 PM

## 2018-04-09 ENCOUNTER — Encounter (HOSPITAL_COMMUNITY): Payer: Self-pay | Admitting: General Practice

## 2018-04-09 DIAGNOSIS — J431 Panlobular emphysema: Secondary | ICD-10-CM

## 2018-04-09 DIAGNOSIS — R0602 Shortness of breath: Secondary | ICD-10-CM | POA: Diagnosis not present

## 2018-04-09 DIAGNOSIS — F411 Generalized anxiety disorder: Secondary | ICD-10-CM

## 2018-04-09 DIAGNOSIS — I130 Hypertensive heart and chronic kidney disease with heart failure and stage 1 through stage 4 chronic kidney disease, or unspecified chronic kidney disease: Secondary | ICD-10-CM | POA: Diagnosis not present

## 2018-04-09 DIAGNOSIS — N183 Chronic kidney disease, stage 3 (moderate): Secondary | ICD-10-CM

## 2018-04-09 DIAGNOSIS — I447 Left bundle-branch block, unspecified: Secondary | ICD-10-CM | POA: Diagnosis not present

## 2018-04-09 DIAGNOSIS — I10 Essential (primary) hypertension: Secondary | ICD-10-CM

## 2018-04-09 DIAGNOSIS — R0600 Dyspnea, unspecified: Secondary | ICD-10-CM | POA: Diagnosis not present

## 2018-04-09 LAB — BASIC METABOLIC PANEL
Anion gap: 7 (ref 5–15)
BUN: 16 mg/dL (ref 8–23)
CALCIUM: 8.7 mg/dL — AB (ref 8.9–10.3)
CO2: 26 mmol/L (ref 22–32)
CREATININE: 0.96 mg/dL (ref 0.44–1.00)
Chloride: 105 mmol/L (ref 98–111)
GFR calc Af Amer: >60 mL/min (ref >60)
GFR, EST NON AFRICAN AMERICAN: 58 mL/min — AB (ref >60)
GLUCOSE: 87 mg/dL (ref 70–99)
Potassium: 4.1 mmol/L (ref 3.5–5.1)
Sodium: 138 mmol/L (ref 135–145)

## 2018-04-09 NOTE — Care Management Obs Status (Signed)
[place] NOTIFICATION   Patient Details  Name: Maria [name] MRN: [id number] Date of Birth: [birth year]   Medicare Observation Status Notification Given:  Yes    Carles Collet, RN 04/09/2018, 1:52 PM

## 2018-04-09 NOTE — Progress Notes (Signed)
Physical Therapy Treatment Patient Details Name: Maria [name] MRN: [id number] DOB: Nov 24, 1945 Today's Date: 04/09/2018    History of Present Illness Pt is a 72 y/o female admitted secondary to increased SOB. CT angio negative for acute abnormality. Chest imaging revealed unchanged cardiomegaly. PMH includes HTN, COPD, Bipolar disorder, tobacco use, back surgery, and ORIF of R ankle.     PT Comments    Pt with no issues with orthostatic hypotension this date. Pt c/o anxiety re: stress test that she is going to today. Pt BP141/65 supine, 135/86 in sitting. No report of dizziness. Pt able to amb with RW this date. Acute PT to con't to follow.    Follow Up Recommendations  Supervision for mobility/OOB;Home health PT     Equipment Recommendations  3in1 (PT)    Recommendations for Other Services OT consult     Precautions / Restrictions Precautions Precautions: Fall;Other (comment) Restrictions Weight Bearing Restrictions: No Other Position/Activity Restrictions: Per pt, pt able to bear weight on RLE now.     Mobility  Bed Mobility Overal bed mobility: Needs Assistance Bed Mobility: Supine to Sit;Sit to Supine     Supine to sit: Modified independent (Device/Increase time) Sit to supine: Modified independent (Device/Increase time)   General bed mobility comments: pt able to bring self to EOB without difficulty and return self to supine without difficulty  Transfers Overall transfer level: Needs assistance Equipment used: None Transfers: Sit to/from Stand Sit to Stand: Min guard         General transfer comment: pt mildly shaky due to anxiety  Ambulation/Gait Ambulation/Gait assistance: Min guard Gait Distance (Feet): 120 Feet Assistive device: Rolling walker (2 wheeled) Gait Pattern/deviations: Step-through pattern;Decreased stride length Gait velocity: slow Gait velocity interpretation: >2.62 ft/sec, indicative of community ambulatory General Gait Details: pt with  increased anxiety re: stress test today. Pt began ambulating without AD however pt with antalgia and significant instability. Pt given RW, much improved but shaky due to anxiety.    Stairs             Wheelchair Mobility    Modified Rankin (Stroke Patients Only)       Balance Overall balance assessment: Needs assistance Sitting-balance support: No upper extremity supported;Feet supported Sitting balance-Leahy Scale: Fair     Standing balance support: No upper extremity supported Standing balance-Leahy Scale: Poor Standing balance comment: requires RW for safe ambulation                            Cognition Arousal/Alertness: Awake/alert Behavior During Therapy: WFL for tasks assessed/performed Overall Cognitive Status: Within Functional Limits for tasks assessed                                        Exercises      General Comments General comments (skin integrity, edema, etc.): pt with no issues with BP today      Pertinent Vitals/Pain Pain Assessment: No/denies pain    Home Living                      Prior Function            PT Goals (current goals can now be found in the care plan section) Acute Rehab PT Goals Patient Stated Goal: to go home today Progress towards PT goals: Progressing toward goals    Frequency  Min 3X/week      PT Plan Current plan remains appropriate    Co-evaluation              AM-PAC PT "6 Clicks" Daily Activity  Outcome Measure  Difficulty turning over in bed (including adjusting bedclothes, sheets and blankets)?: None Difficulty moving from lying on back to sitting on the side of the bed? : A Little Difficulty sitting down on and standing up from a chair with arms (e.g., wheelchair, bedside commode, etc,.)?: A Little Help needed moving to and from a bed to chair (including a wheelchair)?: A Little Help needed walking in hospital room?: A Little Help needed climbing 3-5  steps with a railing? : A Lot 6 Click Score: 18    End of Session Equipment Utilized During Treatment: Gait belt Activity Tolerance: Patient tolerated treatment well Patient left: in bed;with call bell/phone within reach;with nursing/sitter in room Nurse Communication: Mobility status PT Visit Diagnosis: Other abnormalities of gait and mobility (R26.89);Difficulty in walking, not elsewhere classified (R26.2);Muscle weakness (generalized) (M62.81)     Time: 0277-4128 PT Time Calculation (min) (ACUTE ONLY): 20 min  Charges:  $Gait Training: 8-22 mins                     Kittie Plater, PT, DPT Pager #: [phone] Office #: [phone]    [place] 04/09/2018, 9:52 AM

## 2018-04-09 NOTE — Progress Notes (Signed)
Progress Note  Patient Name: Maria [name] Date of Encounter: 04/09/2018  Primary Cardiologist:   No primary care provider on file.   Subjective   She went for [facility] this morning but returned without the test as she was too anxious.  She reported SOB and heart racing.  Chest heavy. However, room air sat was 93%.  She was still SOB and sat was 100% on 2 liters.  HR regular and 82.  Tearful, anxious  Inpatient Medications    Scheduled Meds: . amitriptyline  50 mg Oral QHS  . aspirin EC  81 mg Oral Daily  . enoxaparin (LOVENOX) injection  40 mg Subcutaneous Q24H  . lisinopril  10 mg Oral Daily  . metoprolol succinate  12.5 mg Oral Daily  . nicotine  7 mg Transdermal Daily  . QUEtiapine  200 mg Oral BID  . sertraline  100 mg Oral Daily  . sodium chloride flush  3 mL Intravenous Q12H   Continuous Infusions: . sodium chloride     PRN Meds: sodium chloride, acetaminophen, ALPRAZolam, ipratropium-albuterol, ondansetron (ZOFRAN) IV, oxyCODONE-acetaminophen **AND** oxyCODONE, sodium chloride flush   Vital Signs    Vitals:   04/08/18 1137 04/08/18 2046 04/09/18 0035 04/09/18 0541  BP: (!) 97/55 121/90 127/81 (!) 126/59  Pulse:  71 65 72  Resp:  16 16 18   Temp:  99.3 F (37.4 C) 98 F (36.7 C) 98.5 F (36.9 C)  TempSrc:  Oral Oral Oral  SpO2:  96% 96% 98%  Weight:    170 lb 12.8 oz (77.5 kg)  Height:        Intake/Output Summary (Last 24 hours) at 04/09/2018 0823 Last data filed at 04/09/2018 0555 Gross per 24 hour  Intake 483 ml  Output 925 ml  Net -442 ml   Filed Weights   04/07/18 2230 04/08/18 0756 04/09/18 0541  Weight: 180 lb (81.6 kg) 170 lb 3.2 oz (77.2 kg) 170 lb 12.8 oz (77.5 kg)    Telemetry    Tele:  Rare ventricular ectopy - Personally Reviewed  ECG    NA - Personally Reviewed  Physical Exam   GEN: No acute distress.   Neck: No  JVD Cardiac: RRR, no murmurs, rubs, or gallops.  Respiratory: Clear  to auscultation bilaterally. GI:  Soft, nontender, non-distended  MS: No  edema; No deformity. Neuro:  Nonfocal  Psych:  Tearful and anxious  Labs    Chemistry Recent Labs  Lab 04/07/18 2235 04/09/18 0533  NA 138 138  K 3.0* 4.1  CL 107 105  CO2 21* 26  GLUCOSE 125* 87  BUN 15 16  CREATININE 1.13* 0.96  CALCIUM 8.6* 8.7*  GFRNONAA 48* 58*  GFRAA 55* >60  ANIONGAP 10 7     Hematology Recent Labs  Lab 04/07/18 2235 04/08/18 2203  WBC 8.2 6.9  RBC 5.02 4.68  HGB 12.2 11.5*  HCT 40.8 38.1  MCV 81.3 81.4  MCH 24.3* 24.6*  MCHC 29.9* 30.2  RDW 15.9* 16.2*  PLT 290 302    Cardiac Enzymes Recent Labs  Lab 04/07/18 2235 04/08/18 0758  TROPONINI <0.03 <0.03   No results for input(s): TROPIPOC in the last 168 hours.   BNP Recent Labs  Lab 04/07/18 2235  BNP 421.1*     DDimer  Recent Labs  Lab 04/07/18 2231  DDIMER 0.76*     Radiology    Dg Chest 2 View  Result Date: 04/07/2018 CLINICAL DATA:  Shortness of breath. EXAM: CHEST -  2 VIEW COMPARISON:  Radiograph 12/29/1990 FINDINGS: The cardiomediastinal contours are unchanged, mild cardiomegaly. Again seen calcified granuloma is in the right lung with calcified mediastinal and right hilar nodes. Pulmonary vasculature is normal. No consolidation, pleural effusion, or pneumothorax. No acute osseous abnormalities are seen. Spinal stimulator in place with leads projecting posterior to approximately T9. IMPRESSION: 1. Unchanged mild cardiomegaly without acute chest finding. 2. Prior granulomatous disease. Electronically Signed   By: Jeb Levering M.D.   On: 04/07/2018 22:59   Ct Angio Chest Pe W And/or Wo Contrast  Result Date: 04/08/2018 CLINICAL DATA:  Shortness of breath, chest pain. History of hypertension. Assess for pulmonary embolism. EXAM: CT ANGIOGRAPHY CHEST WITH CONTRAST TECHNIQUE: Multidetector CT imaging of the chest was performed using the standard protocol during bolus administration of intravenous contrast. Multiplanar CT image  reconstructions and MIPs were obtained to evaluate the vascular anatomy. CONTRAST:  56 mL ISOVUE-370 IOPAMIDOL (ISOVUE-370) INJECTION 76% COMPARISON:  Chest radiograph April 07, 2018 FINDINGS: CARDIOVASCULAR: Adequate contrast opacification of the pulmonary artery's. Main pulmonary artery is not enlarged. No pulmonary arterial filling defects to the level of the subsegmental branches. Heart is mildly enlarged. Minimal pericardial thickening. Trace coronary artery calcifications. No pericardial effusion. Thoracic aorta is normal course and caliber, mild calcific atherosclerosis. MEDIASTINUM/NODES: No lymphadenopathy by CT size criteria. Calcified mediastinal and RIGHT hilar lymph nodes. LUNGS/PLEURA: Tracheobronchial tree is patent, no pneumothorax. Mild bronchial wall thickening. No pleural effusions, focal consolidations, pulmonary nodules or masses. Scattered calcified granulomas. Mild centrilobular emphysema. LEFT lung base atelectasis. UPPER ABDOMEN: Calcified hepatosplenic granulomas. Numerous calcifications RIGHT kidney measuring to 3 mm. Status post cholecystectomy. MUSCULOSKELETAL: Nonacute. Coarse breast calcifications. Spinal stimulator leads via LEFT T12-L1 interlaminar approach with distal tip at T9. Broad dextroscoliosis could be positional. Review of the MIP images confirms the above findings. IMPRESSION: 1. No acute pulmonary embolism. 2. Mild bronchial thickening seen with bronchitis or reactive airways disease. No pneumonia. 3. Mild cardiomegaly.  Old granulomatous disease. 4. Partially imaged RIGHT nephrolithiasis. Aortic Atherosclerosis (ICD10-I70.0) and Emphysema (ICD10-J43.9). Electronically Signed   By: Elon Alas M.D.   On: 04/08/2018 02:19    Cardiac Studies   ECHO  Study Conclusions  - Left ventricle: The cavity size was normal. Systolic function   overall appears reduced Images were inadequate for LV wall motion   assessment even with definity There was an increased  relative   contribution of atrial contraction to ventricular filling.   Doppler parameters are consistent with abnormal left ventricular   relaxation (grade 1 diastolic dysfunction). Doppler parameters   are consistent with high ventricular filling pressure. - Aortic valve: Poorly visualized. - Aorta: The aorta was poorly visualized. - Mitral valve: Poorly visualized. - Pulmonic valve: Poorly visualized. - Pulmonary arteries: Systolic pressure could not be accurately   estimated.  Patient Profile     72 y.o. female without prior cardiac history but with COPD.  She presented with some SOB and mildly increased BNP.   Assessment & Plan    DYSPNEA:  Questions anginal equivalent.  Planned [employer] today.  However, she is not able to have any testing secondary to anxiety.  She reports SOB but sats OK and HR OK.  No repeat EKG as tele was normal and baseline was LBBB.  I would like to eventually try to do a stress test but her anxiety will need to be addressed first by Harvie Junior, MD/primary team.   HTN:   BP improved on current meds.  I suspect hypertensive  heart disease but the echo was non diagnostic/poor images.  Possible out patient MRI although she likely would not tolerate this from an anxiety standpoint.  ACUTE ANXIETY:  Plan per primary team.   For questions or updates, please contact [place] Please consult [email or web address] for contact info under Cardiology/STEMI.   Signed, Minus Breeding, MD  04/09/2018, 8:23 AM

## 2018-04-09 NOTE — Consult Note (Signed)
   [facility] CM Inpatient Consult   04/09/2018  Maria [name] Mar 10, 1946 [id number]  Patient screened for  [facility] Management services and for post hospital needs.  This is the patient's second hospitalization within the past 6 months . Patient has [facility] Medicare current risk score for unplanned readmission is 19 % Medium.  Met with the patient at the bedside to assess for needs.  Patient states she feels she will be fine. She endorses her  primary care physician is York Ram, this provider is NOT a [facility] affliate.    Patient is listed with Dr. Bernadette Hoit and she states she has not seen him in 3 years.  .   This patient is Not eligible for [facility] Care Management Services.   Reason:  This phsyician is Not a beneficiary currently attributed to one of the [place].    Natividad Brood, RN BSN [facility] Liaison  [phone] business mobile phone Toll free office [phone]

## 2018-04-09 NOTE — Discharge Summary (Signed)
Physician Discharge Summary  Patient ID: Maria [name] MRN: [id number] DOB/AGE: Nov 30, 1945 72 y.o.  Admit date: 04/07/2018 Discharge date: 04/09/2018  Admission Diagnoses:   Shortness of breath   Essential hypertension   COPD (chronic obstructive pulmonary disease) (HCC)   Bipolar disorder (HCC)   CKD (chronic kidney disease) stage 3, GFR 30-59 ml/min (HCC)   Tobacco dependence  Discharge Diagnoses:    Shortness of breath   Acute anxiety   Essential hypertension   COPD (chronic obstructive pulmonary disease) (HCC)   Bipolar disorder (HCC)   CKD (chronic kidney disease) stage 3, GFR 30-59 ml/min (HCC)   Tobacco dependence   Discharged Condition: good   Chief Presenting Complaint: SOB HPI: Maria [name] is a 72 y.o. female with medical history significant of HTN and bipolar d/o presenting with SOB.  She thought she had a heart attack. "It hurt, bad.  I know I was hyperventilating.  They gave me an Ativan downstairs and my breathing eased up some.  I get up, walk 2 steps, and I'm so dizzy I have to sit back down.  The doctor down there said he wanted me to see a cardiologist.  Anything, I won't be doing nothing, I was doing it downstairs, I just break out into a cold sweat."  +chest pain, Ieft axilla and shoulder.  Non-exertional, she was in bed, "having an anxiety attack."  She does have panic attacks.  All day yesterday she felt SOB and she has been for 2-3 days.  After walking to the kitchen and putting something on the stove, she has to sit down and wait until she can walk again.  She has lost 14 pounds, unintentional, over the last 3 months.  No LE edema (other than chronic from ankle fracture in 4/19).  ED Course:  CT angio chest does not show any PE. Both x-ray and CT does not show any evidence of pulmonary edema either. His BNP is elevated, in the setting of her stating that she is having some orthopnea and exertional dyspnea -new diagnosis of CHF is still a possibility. Ambulatory  pulse ox has been ordered. I spoke with Dr. Neena Rhymes, cardiology. He thinks that since the x-rays are not showing any significant edema, patient is O2 sats at rest is normal, if patient's amatory pulse ox is fine then she can be ideally seen as an outpatient. He will put in a note in the epic for a prompt follow-up.  If patient is able to ambulate well without problems we will discharge her.  Otherwise she will need medicine admission, and they can consult cardiology if needed.   Hospital Course:  Dyspnea -Patient presenting with dyspnea as well as other symptoms -She has a h/o COPD (see below) and so was found to be 90-91% on room air with ambulation; could be related to this -She also has a h/o bipolar with anxiety and believed she was having panic attacks which could also have been contributing to her SOB  -CXR and chest CT negative for pulmonary edema -The cardiology fellow saw the patient in the ER and recommends outpatient f/u - however the patient was admitted and cardiology consulted.  Cardiology noted LBBB per EKG which was not new. Cardiology wanted to rule out SOB as anginal equivalent so sent patient for a cardiac stress test but patient could not cooperate due to severe anxiety - spoke w/ cardiology, enzymes negative, EKG unrevealing, OK for dc they will see her in the office in a few weeks, hopefully anxiety  will be better and she can undergo some form of cardiac testing - I explained this to pateint and her husband, they are OK for dc home today - patient will need 2 bus passes to get home, they don't have a car and use the bus system to get around the both of them   HTN -Continue Lisinopril   COPD with ongoing tobacco dependence - cont home meds, not an issue here  Bipolar d/o -She is on a multitude of medications for this issue -Continue: Elavil, Seroquel, and Zoloft - not taking > xanax and percocet, have dc'd them for home med list   CKD -Likely related to  long-standing HTN -Appears to be stable at this time     Consults: cardiology   Discharge Exam: Blood pressure (!) 141/77, pulse 75, temperature 97.9 F (36.6 C), temperature source Oral, resp. rate 18, height 5\' 7"  (1.702 m), weight 77.5 kg (170 lb 12.8 oz), SpO2 97 %.  General:  Appears calm and comfortable and is NAD on RA  Eyes:  PERRL, EOMI, normal lids, iris  ENT:  grossly normal hearing, lips & tongue, mmm  Neck:  no LAD, masses or thyromegaly; no carotid bruits  Cardiovascular:  RRR, no m/r/g. No LE edema.   Respiratory:   Scattered bibasilar rhonchi vs. crackles.  Normal respiratory effort.  Abdomen:  soft, NT, ND, NABS  Back:   normal alignment, no CVAT  Skin:  no rash or induration seen on limited exam  Musculoskeletal:  grossly normal tone BUE/BLE, good ROM, no bony abnormality; mild chronic R ankle edema  Psychiatric:  Mildly anxious mood and affect, speech fluent and appropriate, AOx3  Neurologic:  CN 2-12 grossly intact, moves all extremities in coordinated fashion, sensation intact     Disposition: Discharge disposition: 01-Home or Self Care        Allergies as of 04/09/2018      Reactions   Penicillins Other (See Comments)   Has patient had a PCN reaction causing immediate rash, facial/tongue/throat swelling, SOB or lightheadedness with hypotension: Yes Has patient had a PCN reaction causing severe rash involving mucus membranes or skin necrosis: Yes Has patient had a PCN reaction that required hospitalization Yes Has patient had a PCN reaction occurring within the last 10 years: No If all of the above answers are "NO", then may proceed with Cephalosporin use.      Medication List    TAKE these medications   amitriptyline 50 MG tablet Commonly known as:  ELAVIL Take 50 mg by mouth at bedtime.   lisinopril 10 MG tablet Commonly known as:  PRINIVIL,ZESTRIL Take 10 mg by mouth daily.   QUEtiapine 200 MG tablet Commonly known as:   SEROQUEL Take 1 tablet (200 mg total) by mouth 2 (two) times daily.   sertraline 100 MG tablet Commonly known as:  ZOLOFT Take 100 mg by mouth daily.      Follow-up Information    Minus Breeding, MD Follow up.   Specialty:  Cardiology Why:  Cardiology hospital follow up on August 20th at 1:00. Please arrive 15 minutes early for check in.  Contact information: [place] [address] [phone]           Signed: Sol Blazing 04/09/2018, 4:34 PM

## 2018-04-09 NOTE — Care Management Note (Signed)
Case Management Note  Patient Details  Name: Maria [name] MRN: [id number] Date of Birth: 1945/11/22  Subjective/Objective:               Spoke w patient at bediside, discussed home care needs. She stated that she just completed PT and was recently released from [facility]. She has RW and WC at home. She does not want any further HH at this time and declined offer to assist with arrangements.      Action/Plan:   Expected Discharge Date:  04/09/18               Expected Discharge Plan:  Home/Self Care  In-House Referral:     Discharge planning Services  CM Consult  Post Acute Care Choice:    Choice offered to:     DME Arranged:    DME Agency:     HH Arranged:  Patient Refused [place] Agency:     Status of Service:  Completed, signed off  If discussed at [employer] of Stay Meetings, dates discussed:    Additional Comments:  Carles Collet, RN 04/09/2018, 1:55 PM

## 2018-04-09 NOTE — Evaluation (Signed)
Occupational Therapy Evaluation Patient Details Name: Maria [name] MRN: [id number] DOB: 04/16/1946 Today's Date: 04/09/2018    History of Present Illness Pt is a 72 y/o female admitted secondary to increased SOB. CT angio negative for acute abnormality. Chest imaging revealed unchanged cardiomegaly. PMH includes HTN, COPD, Bipolar disorder, tobacco use, back surgery, and ORIF of R ankle.    Clinical Impression   PTA, pt was living with her significant other and was independent. Pt currently requiring Supervision-Min Guard A for ADLs and functional mobility due to decreased balance and awareness. Pt requiring increased cues during ADLs and conversation. Pt denies any dizziness during activity. VSS with BP 141/77. Pt would benefit from further acute OT to facilitate safe dc. Recommend dc to home once medically stable per physician.   Follow Up Recommendations  No OT follow up;Supervision/Assistance - 24 hour    Equipment Recommendations  None recommended by OT    Recommendations for Other Services       Precautions / Restrictions Precautions Precautions: Fall;Other (comment) Precaution Comments: Watch BP  Restrictions Weight Bearing Restrictions: No Other Position/Activity Restrictions: Per pt, pt able to bear weight on RLE now.       Mobility Bed Mobility Overal bed mobility: Needs Assistance Bed Mobility: Supine to Sit;Sit to Supine     Supine to sit: Modified independent (Device/Increase time) Sit to supine: Modified independent (Device/Increase time)   General bed mobility comments: pt able to bring self to EOB without difficulty and return self to supine without difficulty  Transfers Overall transfer level: Needs assistance Equipment used: None Transfers: Sit to/from Stand Sit to Stand: Min guard         General transfer comment: Min Guard A for safety.    Balance Overall balance assessment: Needs assistance Sitting-balance support: No upper extremity  supported;Feet supported Sitting balance-Leahy Scale: Fair     Standing balance support: No upper extremity supported Standing balance-Leahy Scale: Fair Standing balance comment: Able to maintain standing balance without UE support                           ADL either performed or assessed with clinical judgement   ADL Overall ADL's : Needs assistance/impaired Eating/Feeding: Set up;Sitting   Grooming: Wash/dry hands;Min guard;Standing Grooming Details (indicate cue type and reason): Min Guard A for safety with decreased balance and awareness.  Upper Body Bathing: Set up;Supervision/ safety;Sitting   Lower Body Bathing: Min guard;Sit to/from stand   Upper Body Dressing : Set up;Supervision/safety;Sitting   Lower Body Dressing: Min guard;Sit to/from stand   Toilet Transfer: Social worker Details (indicate cue type and reason): supervision for safety Toileting- Clothing Manipulation and Hygiene: Supervision/safety;Sitting/lateral lean       Functional mobility during ADLs: Min guard General ADL Comments: Pt performing ADLs and functional mobility at [facility] level due to decreased balance and awareness.      Vision         Perception     Praxis      Pertinent Vitals/Pain Pain Assessment: No/denies pain     Hand Dominance Right   Extremity/Trunk Assessment Upper Extremity Assessment Upper Extremity Assessment: Overall WFL for tasks assessed   Lower Extremity Assessment Lower Extremity Assessment: Generalized weakness   Cervical / Trunk Assessment Cervical / Trunk Assessment: Normal   Communication Communication Communication: No difficulties   Cognition Arousal/Alertness: Awake/alert Behavior During Therapy: WFL for tasks assessed/performed Overall Cognitive Status: Within Functional Limits for tasks assessed Area  of Impairment: Awareness;Problem solving                           Awareness:  Emergent Problem Solving: Requires verbal cues General Comments: Decreased awareness and requriing increased cues during ession for answering questions and following commands. After questions, pt would ask "what" or "huh?" and would require that a questions be repeated. Feel this is close to baseline function and cognition impacted by baseline mental health diagnosis.   General Comments       Exercises     Shoulder Instructions      Home Living Family/patient expects to be discharged to:: Private residence Living Arrangements: Spouse/significant other(Boyfriend) Available Help at Discharge: Family;Available 24 hours/day Type of Home: Apartment(First floor) Home Access: Stairs to enter Entrance Stairs-Number of Steps: 1 Entrance Stairs-Rails: None Home Layout: One level     Bathroom Shower/Tub: Teacher, early years/pre: Standard     Home Equipment: Environmental consultant - 2 wheels;Wheelchair - manual          Prior Functioning/Environment Level of Independence: Independent        Comments: ADLs and IADLs        OT Problem List: Decreased activity tolerance;Impaired balance (sitting and/or standing);Decreased safety awareness;Decreased knowledge of precautions;Decreased cognition      OT Treatment/Interventions: Self-care/ADL training;Therapeutic exercise;Energy conservation;DME and/or AE instruction;Therapeutic activities;Patient/family education    OT Goals(Current goals can be found in the care plan section) Acute Rehab OT Goals Patient Stated Goal: to go home today OT Goal Formulation: With patient Time For Goal Achievement: 04/23/18 Potential to Achieve Goals: Good ADL Goals Pt Will Perform Grooming: with modified independence;standing Pt Will Perform Lower Body Dressing: with modified independence;sit to/from stand Pt Will Transfer to Toilet: with modified independence;ambulating;regular height toilet Pt Will Perform Tub/Shower Transfer: Tub  transfer;ambulating;with modified independence Additional ADL Goal #1: Pt will perform four part ADL/IADL with 1-2 verbal cues for problem solving  OT Frequency: Min 2X/week   Barriers to D/C:            Co-evaluation              AM-PAC PT "6 Clicks" Daily Activity     Outcome Measure Help from another person eating meals?: None Help from another person taking care of personal grooming?: A Little Help from another person toileting, which includes using toliet, bedpan, or urinal?: A Little Help from another person bathing (including washing, rinsing, drying)?: A Little Help from another person to put on and taking off regular upper body clothing?: None Help from another person to put on and taking off regular lower body clothing?: A Little 6 Click Score: 20   End of Session Equipment Utilized During Treatment: Gait belt Nurse Communication: Mobility status  Activity Tolerance: Patient tolerated treatment well Patient left: in chair;with call bell/phone within reach;with chair alarm set  OT Visit Diagnosis: Unsteadiness on feet (R26.81);Other abnormalities of gait and mobility (R26.89);Muscle weakness (generalized) (M62.81);Other symptoms and signs involving cognitive function                Time: 1145-1157 OT Time Calculation (min): 12 min Charges:  OT General Charges $OT Visit: 1 Visit OT Evaluation $OT Eval Moderate Complexity: Mobile, OTR/L Acute Rehab Pager: [phone] Office: [place] 04/09/2018, 12:20 PM

## 2018-04-28 NOTE — Progress Notes (Deleted)
Cardiology Office Note   Date:  04/28/2018   ID:  Maria [name], DOB [birth year], MRN [id number]  PCP:  Harvie Junior, MD  Cardiologist:   No primary care provider on file. Referring:  ***  No chief complaint on file.    History of Present Illness: Maria [name] is a 72 y.o. female who presents for ***    She was in the hospital recently for acute respiratory failure.  I saw her in consultation at that time.   She did not have edema or CXR and CT was negative for PE.  Sats were low 90s in the ED and BNP was slightly elevated.   The patient was to have a stress test but could not do this secondary to anxiety.     Past Medical History:  Diagnosis Date  . Arthritis   . Bipolar disorder ([place])   . COPD (chronic obstructive pulmonary disease) ([place])   . Fracture, ankle 12/25/2017   closed right ankle fracture  . History of kidney stones   . Hypertension     Past Surgical History:  Procedure Laterality Date  . bladder tack    . BREAST LUMPECTOMY    . CARPAL TUNNEL RELEASE Bilateral   . CHOLECYSTECTOMY    . LAMINECTOMY     neck  . ORIF ANKLE FRACTURE Right 12/26/2017   Procedure: OPEN REDUCTION INTERNAL FIXATION (ORIF) RIGHT BIMALLEOLAR ANKLE FRACTURE;  Surgeon: Marybelle Killings, MD;  Location: [facility];  Service: Orthopedics;  Laterality: Right;  . RADICAL HYSTERECTOMY    . spinal fusion       Current Outpatient Medications  Medication Sig Dispense Refill  . amitriptyline (ELAVIL) 50 MG tablet Take 50 mg by mouth at bedtime.  5  . lisinopril (PRINIVIL,ZESTRIL) 10 MG tablet Take 10 mg by mouth daily.    . QUEtiapine (SEROQUEL) 200 MG tablet Take 1 tablet (200 mg total) by mouth 2 (two) times daily. 10 tablet 0  . sertraline (ZOLOFT) 100 MG tablet Take 100 mg by mouth daily.     No current facility-administered medications for this visit.     Allergies:   Penicillins    ROS:  Please see the history of present illness.   Otherwise, review of systems are positive for  {NONE DEFAULTED:18576::"none"}.   All other systems are reviewed and negative.    PHYSICAL EXAM: VS:  There were no vitals taken for this visit. , BMI There is no height or weight on file to calculate BMI. GENERAL:  Well appearin NECK:  No jugular venous distention, waveform within normal limits, carotid upstroke brisk and symmetric, no bruits, no thyromegaly LUNGS:  Clear to auscultation bilaterally CHEST:  Unremarkable HEART:  PMI not displaced or sustained,S1 and S2 within normal limits, no S3, no S4, no clicks, no rubs, *** murmurs ABD:  Flat, positive bowel sounds normal in frequency in pitch, no bruits, no rebound, no guarding, no midline pulsatile mass, no hepatomegaly, no splenomegaly EXT:  2 plus pulses throughout, no edema, no cyanosis no clubbing    EKG:  EKG {ACTION; IS/IS PYP:95093267} ordered today. The ekg ordered today demonstrates ***   Recent Labs: 12/25/2017: ALT 21 04/07/2018: B Natriuretic Peptide 421.1 04/08/2018: Hemoglobin 11.5; Platelets 302 04/09/2018: BUN 16; Creatinine, Ser 0.96; Potassium 4.1; Sodium 138    Lipid Panel No results found for: CHOL, TRIG, HDL, CHOLHDL, VLDL, LDLCALC, LDLDIRECT    Wt Readings from Last 3 Encounters:  04/09/18 170 lb 12.8 oz (77.5 kg)  12/26/17  182 lb (82.6 kg)  09/04/16 150 lb (68 kg)      Other studies Reviewed: Additional studies/ records that were reviewed today include: ***. Review of the above records demonstrates:  Please see elsewhere in the note.  ***   ASSESSMENT AND PLAN:  SOB:  ***  HTN:  ***  CKD:  ***    Current medicines are reviewed at length with the patient today.  The patient {ACTIONS; HAS/DOES NOT HAVE:19233} concerns regarding medicines.  The following changes have been made:  {PLAN; NO CHANGE:13088:s}  Labs/ tests ordered today include: *** No orders of the defined types were placed in this encounter.    Disposition:   FU with ***    Signed, Minus Breeding, MD  04/28/2018 7:20  PM    [facility]

## 2018-04-30 ENCOUNTER — Ambulatory Visit: Payer: Medicare HMO | Admitting: Cardiology

## 2018-05-01 ENCOUNTER — Encounter: Payer: Self-pay | Admitting: *Deleted

## 2018-05-09 ENCOUNTER — Other Ambulatory Visit: Payer: Self-pay | Admitting: Physician Assistant

## 2018-05-09 DIAGNOSIS — Z1231 Encounter for screening mammogram for malignant neoplasm of breast: Secondary | ICD-10-CM

## 2018-07-10 ENCOUNTER — Other Ambulatory Visit: Payer: Self-pay

## 2018-07-10 ENCOUNTER — Observation Stay (HOSPITAL_COMMUNITY)
Admission: EM | Admit: 2018-07-10 | Discharge: 2018-07-11 | Disposition: A | Discharge status: Home / Self Care | Payer: Medicare HMO | Attending: Internal Medicine | Admitting: Internal Medicine

## 2018-07-10 ENCOUNTER — Encounter (HOSPITAL_COMMUNITY): Payer: Self-pay | Admitting: Emergency Medicine

## 2018-07-10 ENCOUNTER — Emergency Department (HOSPITAL_COMMUNITY): Payer: Medicare HMO

## 2018-07-10 DIAGNOSIS — F419 Anxiety disorder, unspecified: Secondary | ICD-10-CM | POA: Insufficient documentation

## 2018-07-10 DIAGNOSIS — F319 Bipolar disorder, unspecified: Secondary | ICD-10-CM | POA: Diagnosis present

## 2018-07-10 DIAGNOSIS — I10 Essential (primary) hypertension: Secondary | ICD-10-CM | POA: Diagnosis present

## 2018-07-10 DIAGNOSIS — R079 Chest pain, unspecified: Principal | ICD-10-CM | POA: Diagnosis present

## 2018-07-10 DIAGNOSIS — I5042 Chronic combined systolic (congestive) and diastolic (congestive) heart failure: Secondary | ICD-10-CM | POA: Insufficient documentation

## 2018-07-10 DIAGNOSIS — I110 Hypertensive heart disease with heart failure: Secondary | ICD-10-CM | POA: Diagnosis not present

## 2018-07-10 DIAGNOSIS — R0789 Other chest pain: Secondary | ICD-10-CM

## 2018-07-10 LAB — CBC
HCT: 36 % (ref 36.0–46.0)
HEMOGLOBIN: 10.6 g/dL — AB (ref 12.0–15.0)
MCH: 24 pg — ABNORMAL LOW (ref 26.0–34.0)
MCHC: 29.4 g/dL — AB (ref 30.0–36.0)
MCV: 81.6 fL (ref 80.0–100.0)
Platelets: 305 10*3/uL (ref 150–400)
RBC: 4.41 MIL/uL (ref 3.87–5.11)
RDW: 16.3 % — ABNORMAL HIGH (ref 11.5–15.5)
WBC: 9.1 10*3/uL (ref 4.0–10.5)
nRBC: 0 % (ref 0.0–0.2)

## 2018-07-10 LAB — BASIC METABOLIC PANEL
Anion gap: 4 — ABNORMAL LOW (ref 5–15)
BUN: 17 mg/dL (ref 8–23)
CO2: 26 mmol/L (ref 22–32)
Calcium: 8.5 mg/dL — ABNORMAL LOW (ref 8.9–10.3)
Chloride: 109 mmol/L (ref 98–111)
Creatinine, Ser: 0.93 mg/dL (ref 0.44–1.00)
Glucose, Bld: 115 mg/dL — ABNORMAL HIGH (ref 70–99)
POTASSIUM: 3.7 mmol/L (ref 3.5–5.1)
SODIUM: 139 mmol/L (ref 135–145)

## 2018-07-10 LAB — I-STAT TROPONIN, ED: TROPONIN I, POC: 0.01 ng/mL (ref 0.00–0.08)

## 2018-07-10 NOTE — ED Triage Notes (Signed)
Per EMS, pt coming from a shelter where she had sudden left sided chest pain that radiates to her left arm starting at 6pm. Pt had a recent MI about 2-3 months ago. Pt received x1 nitro via EMS, pain 5/10 now. Pt also complained of SOB and N/V when her CP started.

## 2018-07-10 NOTE — ED Provider Notes (Signed)
[facility] EMERGENCY DEPARTMENT Provider Note   CSN: [id number] Arrival date & time: 07/10/18  2238     History   Chief Complaint Chief Complaint  Patient presents with  . Chest Pain    HPI Maria [name] is a 72 y.o. female with history of hypertension, COPD, bipolar disorder, CHF who presents with chest pain that began around 5 or 6 this evening.  Patient reports initially her pain was intermittent, but has been constant for the past couple hours.  She describes it as a pressure and radiating down her left arm.  She has had associated shortness of breath, nausea, and vomiting.  She reports she felt nauseous and vomited a couple times yesterday.  She denies any abdominal pain.  She reports it feels similar to when she had an MI several years ago.  She denies any recent long trips, surgeries, new leg pain or swelling, history of blood clots, exogenous estrogen use.  Patient was given nitroglycerin en route which improved her pain from 8/10 to 6/10.  She does report having headache now.  Patient is currently living at [facility].    HPI  Past Medical History:  Diagnosis Date  . Arthritis   . Bipolar disorder ([place])   . COPD (chronic obstructive pulmonary disease) ([place])   . Fracture, ankle 12/25/2017   closed right ankle fracture  . History of kidney stones   . Hypertension     Patient Active Problem List   Diagnosis Date Noted  . Chest pain, rule out acute myocardial infarction 07/11/2018  . Anxiety state   . CHF (congestive heart failure) ([place]) 04/08/2018  . Essential hypertension 04/08/2018  . COPD (chronic obstructive pulmonary disease) ([place]) 04/08/2018  . Bipolar disorder ([place]) 04/08/2018  . CKD (chronic kidney disease) stage 3, GFR 30-59 ml/min ([facility]) 04/08/2018  . Tobacco dependence 04/08/2018  . Closed right ankle fracture 12/25/2017    Past Surgical History:  Procedure Laterality Date  . bladder tack    . BREAST LUMPECTOMY    . CARPAL  TUNNEL RELEASE Bilateral   . CHOLECYSTECTOMY    . LAMINECTOMY     neck  . ORIF ANKLE FRACTURE Right 12/26/2017   Procedure: OPEN REDUCTION INTERNAL FIXATION (ORIF) RIGHT BIMALLEOLAR ANKLE FRACTURE;  Surgeon: Marybelle Killings, MD;  Location: [facility];  Service: Orthopedics;  Laterality: Right;  . RADICAL HYSTERECTOMY    . spinal fusion       OB History   None      Home Medications    Prior to Admission medications   Medication Sig Start Date End Date Taking? Authorizing Provider  amitriptyline (ELAVIL) 50 MG tablet Take 50 mg by mouth at bedtime. 03/04/18  Yes [provider]  lisinopril (PRINIVIL,ZESTRIL) 10 MG tablet Take 10 mg by mouth daily.   Yes [provider]  QUEtiapine (SEROQUEL) 200 MG tablet Take 1 tablet (200 mg total) by mouth 2 (two) times daily. 09/04/16  Yes Domenic Moras, PA-C  sertraline (ZOLOFT) 100 MG tablet Take 100 mg by mouth daily.   Yes [provider]    Family History Family History  Problem Relation Age of Onset  . Hypertension Mother   . Heart failure Neg Hx   . CAD Neg Hx     Social History Social History   Tobacco Use  . Smoking status: Current Every Day Smoker    Packs/day: 0.50    Years: 56.00    Pack years: 28.00    Types: Cigarettes  .  Smokeless tobacco: Never Used  . Tobacco comment: down to 3 cigarettes/day  Substance Use Topics  . Alcohol use: No  . Drug use: No     Allergies   Penicillins   Review of Systems Review of Systems  Constitutional: Negative for chills and fever.  HENT: Negative for facial swelling and sore throat.   Respiratory: Positive for shortness of breath.   Cardiovascular: Positive for chest pain.  Gastrointestinal: Positive for nausea and vomiting. Negative for abdominal pain.  Genitourinary: Negative for dysuria.  Musculoskeletal: Negative for back pain.  Skin: Negative for rash and wound.  Neurological: Positive for headaches.  Psychiatric/Behavioral: The patient is not  nervous/anxious.      Physical Exam Updated Vital Signs BP (!) 130/48   Pulse 74   Temp 98.7 F (37.1 C) (Oral)   Resp 12   Ht 5\' 7"  (1.702 m)   Wt 74.8 kg   SpO2 93%   BMI 25.84 kg/m   Physical Exam  Constitutional: She appears well-developed and well-nourished. No distress.  HENT:  Head: Normocephalic and atraumatic.  Mouth/Throat: Oropharynx is clear and moist. No oropharyngeal exudate.  Eyes: Pupils are equal, round, and reactive to light. Conjunctivae are normal. Right eye exhibits no discharge. Left eye exhibits no discharge. No scleral icterus.  Neck: Normal range of motion. Neck supple. No thyromegaly present.  Cardiovascular: Normal rate, regular rhythm, normal heart sounds and intact distal pulses. Exam reveals no gallop and no friction rub.  No murmur heard. Pulmonary/Chest: Effort normal and breath sounds normal. No stridor. No respiratory distress. She has no wheezes. She has no rales.  Abdominal: Soft. Bowel sounds are normal. She exhibits no distension. There is no tenderness. There is no rebound and no guarding.  Musculoskeletal: She exhibits no edema.       Right lower leg: She exhibits no tenderness and no edema.       Left lower leg: She exhibits no tenderness and no edema.  Lymphadenopathy:    She has no cervical adenopathy.  Neurological: She is alert. Coordination normal.  Skin: Skin is warm and dry. No rash noted. She is not diaphoretic. No pallor.  Psychiatric: She has a normal mood and affect.  Nursing note and vitals reviewed.    ED Treatments / Results  Labs (all labs ordered are listed, but only abnormal results are displayed) Labs Reviewed  BASIC METABOLIC PANEL - Abnormal; Notable for the following components:      Result Value   Glucose, Bld 115 (*)    Calcium 8.5 (*)    Anion gap 4 (*)    All other components within normal limits  CBC - Abnormal; Notable for the following components:   Hemoglobin 10.6 (*)    MCH 24.0 (*)    MCHC  29.4 (*)    RDW 16.3 (*)    All other components within normal limits  BRAIN NATRIURETIC PEPTIDE - Abnormal; Notable for the following components:   B Natriuretic Peptide 424.8 (*)    All other components within normal limits  TROPONIN I  TROPONIN I  TROPONIN I  I-STAT TROPONIN, ED    EKG EKG Interpretation  Date/Time:  Wednesday July 10 2018 22:46:05 EDT Ventricular Rate:  88 PR Interval:    QRS Duration: 152 QT Interval:  440 QTC Calculation: 533 R Axis:   -16 Text Interpretation:  Sinus rhythm Left atrial enlargement Left bundle branch block When compared with ECG of 04/09/2018, No significant change was found Confirmed by Roxanne Mins,  Shanon Brow [phone]) on 07/11/2018 1:37:23 AM   Radiology Dg Chest 2 View  Result Date: 07/10/2018 CLINICAL DATA:  72 year old female with chest pain. EXAM: CHEST - 2 VIEW COMPARISON:  Chest CT dated 04/08/2018 FINDINGS: The lungs are clear. There is no pleural effusion or pneumothorax. Right lower lobe calcified granuloma. The cardiac silhouette is within normal limits. No acute osseous pathology. Spinal stimulator over the lower thoracic spine. No acute osseous pathology. Degenerative changes of the spine. IMPRESSION: No active cardiopulmonary disease. Electronically Signed   By: Anner Crete M.D.   On: 07/10/2018 23:46    Procedures Procedures (including critical care time)  Medications Ordered in ED Medications  acetaminophen (TYLENOL) tablet 650 mg (has no administration in time range)  ondansetron (ZOFRAN) injection 4 mg (has no administration in time range)  enoxaparin (LOVENOX) injection 40 mg (40 mg Subcutaneous Given 07/11/18 0312)  lisinopril (PRINIVIL,ZESTRIL) tablet 10 mg (has no administration in time range)  QUEtiapine (SEROQUEL) tablet 200 mg (200 mg Oral Not Given 07/11/18 0313)  sertraline (ZOLOFT) tablet 100 mg (has no administration in time range)  amitriptyline (ELAVIL) tablet 50 mg (50 mg Oral Not Given 07/11/18 0312)    fentaNYL (SUBLIMAZE) injection 25 mcg (has no administration in time range)  fentaNYL (SUBLIMAZE) injection 25 mcg (25 mcg Intravenous Given 07/11/18 0042)     Initial Impression / Assessment and Plan / ED Course  I have reviewed the triage vital signs and the nursing notes.  Pertinent labs & imaging results that were available during my care of the patient were reviewed by me and considered in my medical decision making (see chart for details).     Patient presenting with chest pain concerning for ACS.  Initial troponin is negative.  Hemoglobin 10.6.  BMP shows glucose 115.  EKG shows NSR and stable left bundle branch block.  Chest x-ray is negative.  Patient given fentanyl for pain control.  She is agreeable to admission for chest pain rule out.  I discussed patient case with Dr. Alcario Drought with [facility] who accepts the patient for further evaluation.  I appreciate his assistance with the patient. I discussed patient case with Dr. Stark Jock who guided the patient's management and agrees with plan.   Final Clinical Impressions(s) / ED Diagnoses   Final diagnoses:  Other chest pain    ED Discharge Orders    None       Frederica Kuster, PA-C 07/11/18 [id number]    Veryl Speak, MD 07/11/18 [phone]

## 2018-07-11 ENCOUNTER — Observation Stay (HOSPITAL_BASED_OUTPATIENT_CLINIC_OR_DEPARTMENT_OTHER): Payer: Medicare HMO

## 2018-07-11 DIAGNOSIS — R079 Chest pain, unspecified: Secondary | ICD-10-CM

## 2018-07-11 DIAGNOSIS — I10 Essential (primary) hypertension: Secondary | ICD-10-CM | POA: Diagnosis not present

## 2018-07-11 DIAGNOSIS — F319 Bipolar disorder, unspecified: Secondary | ICD-10-CM

## 2018-07-11 LAB — NM MYOCAR MULTI W/SPECT W/WALL MOTION / EF
CHL CUP MPHR: 149 {beats}/min
CHL RATE OF PERCEIVED EXERTION: 0
CSEPEDS: 0 s
CSEPHR: 64 %
CSEPPHR: 96 {beats}/min
Estimated workload: 1 METS
Exercise duration (min): 0 min
Rest HR: 67 {beats}/min

## 2018-07-11 LAB — TROPONIN I
Troponin I: <0.03 ng/mL (ref <0.03)
Troponin I: <0.03 ng/mL (ref <0.03)

## 2018-07-11 LAB — BRAIN NATRIURETIC PEPTIDE: B Natriuretic Peptide: 424.8 pg/mL — ABNORMAL HIGH (ref 0.0–100.0)

## 2018-07-11 MED ORDER — ENOXAPARIN SODIUM 40 MG/0.4ML ~~LOC~~ SOLN
40.0000 mg | Freq: Every day | SUBCUTANEOUS | Status: DC
  Administered 2018-07-11: 40 mg via SUBCUTANEOUS
  Filled 2018-07-11: qty 0.4

## 2018-07-11 MED ORDER — AMITRIPTYLINE HCL 25 MG PO TABS
50.0000 mg | ORAL_TABLET | Freq: Every day | ORAL | Status: DC
  Filled 2018-07-11: qty 2

## 2018-07-11 MED ORDER — ONDANSETRON HCL 4 MG/2ML IJ SOLN: 4.0000 mg | Freq: Four times a day (QID) | INTRAMUSCULAR | Status: DC | PRN

## 2018-07-11 MED ORDER — FENTANYL CITRATE (PF) 100 MCG/2ML IJ SOLN
25.0000 ug | Freq: Once | INTRAMUSCULAR | Status: AC
  Administered 2018-07-11: 25 ug via INTRAVENOUS
  Filled 2018-07-11: qty 2

## 2018-07-11 MED ORDER — FENTANYL CITRATE (PF) 100 MCG/2ML IJ SOLN: 25.0000 ug | INTRAMUSCULAR | Status: DC | PRN

## 2018-07-11 MED ORDER — ACETAMINOPHEN 325 MG PO TABS
650.0000 mg | ORAL_TABLET | ORAL | Status: DC | PRN
  Administered 2018-07-11: 650 mg via ORAL
  Filled 2018-07-11: qty 2

## 2018-07-11 MED ORDER — TECHNETIUM TC 99M TETROFOSMIN IV KIT
30.0000 | PACK | Freq: Once | INTRAVENOUS | Status: AC | PRN
  Administered 2018-07-11: 30 via INTRAVENOUS

## 2018-07-11 MED ORDER — QUETIAPINE FUMARATE 200 MG PO TABS
200.0000 mg | ORAL_TABLET | Freq: Two times a day (BID) | ORAL | Status: DC
  Administered 2018-07-11: 200 mg via ORAL
  Filled 2018-07-11: qty 1
  Filled 2018-07-11: qty 4
  Filled 2018-07-11 (×2): qty 1

## 2018-07-11 MED ORDER — METOPROLOL TARTRATE 25 MG PO TABS: 12.5000 mg | ORAL_TABLET | Freq: Two times a day (BID) | ORAL | 0 refills | Status: DC

## 2018-07-11 MED ORDER — REGADENOSON 0.4 MG/5ML IV SOLN
INTRAVENOUS | Status: AC
  Filled 2018-07-11: qty 5

## 2018-07-11 MED ORDER — LISINOPRIL 10 MG PO TABS
10.0000 mg | ORAL_TABLET | Freq: Every day | ORAL | Status: DC
  Administered 2018-07-11: 10 mg via ORAL
  Filled 2018-07-11: qty 1

## 2018-07-11 MED ORDER — TECHNETIUM TC 99M TETROFOSMIN IV KIT
10.0000 | PACK | Freq: Once | INTRAVENOUS | Status: AC | PRN
  Administered 2018-07-11: 10 via INTRAVENOUS

## 2018-07-11 MED ORDER — FUROSEMIDE 20 MG PO TABS: 20.0000 mg | ORAL_TABLET | Freq: Every day | ORAL | 0 refills | Status: DC

## 2018-07-11 MED ORDER — SERTRALINE HCL 100 MG PO TABS
100.0000 mg | ORAL_TABLET | Freq: Every day | ORAL | Status: DC
  Administered 2018-07-11: 100 mg via ORAL
  Filled 2018-07-11: qty 1

## 2018-07-11 MED ORDER — REGADENOSON 0.4 MG/5ML IV SOLN
0.4000 mg | Freq: Once | INTRAVENOUS | Status: AC
  Administered 2018-07-11: 0.4 mg via INTRAVENOUS
  Filled 2018-07-11: qty 5

## 2018-07-11 MED ORDER — LORAZEPAM 1 MG PO TABS: 1.0000 mg | ORAL_TABLET | Freq: Once | ORAL | Status: DC | PRN

## 2018-07-11 NOTE — Progress Notes (Signed)
   Lexiscan stress portion completed without complications. Nuclear portion to follow.   Darreld Mclean, PA-C 10:57 AM 07/11/2018

## 2018-07-11 NOTE — Clinical Social Work Note (Signed)
Clinical Social Work Assessment  Patient Details  Name: Maria [name] MRN: [id number] Date of Birth: [birth year]  Date of referral:  07/11/18               Reason for consult:  [employer], Housing Concerns/Homelessness, Ecologist sought to share information with:  [employer] granted to share information::  Yes, Verbal Permission Granted  Name::        Agency::     Relationship::  Husband  Contact Information:     Housing/Transportation Living arrangements for the past 2 months:  Barrister's clerk of Information:  Patient, Spouse Patient Interpreter Needed:  None Criminal Activity/Legal Involvement Pertinent to Current Situation/Hospitalization:  No - Comment as needed Significant Relationships:  Spouse Lives with:  Spouse Do you feel safe going back to the place where you live?  No Need for family participation in patient care:  No (Coment)  Care giving concerns: CSW consulted for patient homelessness.    Social Worker assessment / plan: CSW met with patient and patient's significant other at bedside. CSW introduced self and role and discussed reason for consult. Patient alert and oriented. Patient's significant other did interject several times during conversation.   Patient and SO reported that they have been staying at [facility] for a few weeks. Patient and SO explained that women at the shelter have had to stay at the [facility] because the shower facilities at [facility] have not been working. Patient reluctant to return to [facility] because of problems with other shelter guests.  Patient and SO planning to stay in a boarding house starting on Nov 1, so would only have one more night in the shelter tonight.  CSW provided list of alternate shelters, as well as other community resources - Scientific laboratory technician housing, [employer], and [employer]. CSW provided bus passes for transportation upon discharge. CSW signing off,  as no additional needs identified.   Employment status:  Retired Lawyer) PT Recommendations:  Not assessed at this time [place] / Referral to community resources:  Shelter, Other (Comment Required)([employer]; Development worker, community)  Patient/Family's Response to care: Patient and SO appreciative of care.  Patient/Family's Understanding of and Emotional Response to Diagnosis, Current Treatment, and Prognosis: Patient with understanding of her condition.  Emotional Assessment Appearance:  Appears stated age Attitude/Demeanor/Rapport:  Engaged Affect (typically observed):  Frustrated, Overwhelmed Orientation:  Oriented to Self, Oriented to Situation, Oriented to Place, Oriented to  Time Alcohol / Substance use:  Tobacco Use Psych involvement (Current and /or in the community):  No (Comment)  Discharge Needs  Concerns to be addressed:  Homelessness Readmission within the last 30 days:  No Current discharge risk:  Homeless Barriers to Discharge:  No Barriers Identified   Estanislado Emms, LCSW 07/11/2018, 4:14 PM

## 2018-07-11 NOTE — Discharge Summary (Addendum)
Physician Discharge Summary  Maria [name] VZC:[id number] DOB: 07/22/1946 DOA: 07/10/2018  PCP: Benito Mccreedy, MD  Admit date: 07/10/2018 Discharge date: 07/11/2018  Time spent: 45 minutes  Recommendations for Outpatient Follow-up:  Patient will be discharged to home.  Patient will need to follow up with primary care provider within one week of discharge.  Patient to follow up with Dr. Warren [name], cardiology, 3-4 weeks. Patient should continue medications as prescribed.  Patient should follow a heart healthy diet.   Discharge Diagnoses:  Chest pain Essential hypertension Bipolar disorder/ Anxiety Chronic combined systolic and diastolic heart failure  Discharge Condition: Stable   Diet recommendation: heart healthy  Filed Weights   07/10/18 2250  Weight: 74.8 kg    History of present illness:  On 07/11/2018 by Dr. Lisabeth Pick [name] is a 72 y.o. female with medical history significant of HTN, COPD, BPD, remote h/o MI per patient, LBBB.  Patient presents to the ED with c/o chest pain.  Onset ~5-6 pm.  Initially intermittent but has become constant for past couple of hours.  Pressure, radiates down L arm.  Associated SOB, nausea, vomiting.  No abd pain.  Feels similar to prior MI several years ago.  No recent travel, surgeries, new leg swelliing.  Hospital Course:  Chest pain -no longer having chest pain -Troponin cycled and unremarkable -cardiology consulted and appreciated, recommended lexiscan -Lexiscan showed intermediate risk study due to moderately depressed global LV systolic function. Fixed apico-septal defect that does not follow typical coronary distribution, probably a LBBB-related artifact. Reversible ischemia not present.  EF 36%  -reviewed previous EKGs, LBBB was noted -Discussed with cardiology, Dr. Margaretann Loveless, feels EF has been reduced for some time and can be followed up as an outpatient. Recommended low dose diuretic.   Essential  hypertension -continue lisinopril   Bipolar disorder/ Anxiety -Continue seroquel, amitriptyline, zoloft  Chronic combined systolic and diastolic heart failure -Appears to be euvolemic -Echocardiogram from July 2019: showed reduced systolic function, J2IN -Lexiscan as above -Added low dose metoprolol and lasix  Procedures: [place]  Consultations: Cardiology  Discharge Exam: Vitals:   07/11/18 1042 07/11/18 1044  BP: 138/66 138/65  Pulse: 87 88  Resp:    Temp:    SpO2:     Patient no longer complaining of chest pain. Denies current shortness of breath, abdominal pain, N/V/D/C. Feels her chest pain was due to anxiety.    General: Well developed, well nourished, NAD, appears stated age  72: NCAT, mucous membranes moist.  Neck: Supple  Cardiovascular: S1 S2 auscultated, RRR, no murmur  Respiratory: Clear to auscultation bilaterally with equal chest rise  Abdomen: Soft, nontender, nondistended, + bowel sounds  Extremities: warm dry without cyanosis clubbing or edema  Neuro: AAOx3, nonfocal  Psych: Anxiuos, however appropriate  Discharge Instructions Discharge Instructions    Discharge instructions   Complete by:  As directed    Patient will be discharged to home.  Patient will need to follow up with primary care provider within one week of discharge.  Patient to follow up with Dr. Warren [name], cardiology, 3-4 weeks. Patient should continue medications as prescribed.  Patient should follow a heart healthy diet.     Allergies as of 07/11/2018      Reactions   Penicillins Other (See Comments)   Has patient had a PCN reaction causing immediate rash, facial/tongue/throat swelling, SOB or lightheadedness with hypotension: Yes Has patient had a PCN reaction causing severe rash involving mucus membranes or skin necrosis: Yes Has patient had a PCN  reaction that required hospitalization Yes Has patient had a PCN reaction occurring within the last 10 years: No If all of  the above answers are "NO", then may proceed with Cephalosporin use.      Medication List    TAKE these medications   amitriptyline 50 MG tablet Commonly known as:  ELAVIL Take 50 mg by mouth at bedtime.   furosemide 20 MG tablet Commonly known as:  LASIX Take 1 tablet (20 mg total) by mouth daily.   lisinopril 10 MG tablet Commonly known as:  PRINIVIL,ZESTRIL Take 10 mg by mouth daily.   metoprolol tartrate 25 MG tablet Commonly known as:  LOPRESSOR Take 0.5 tablets (12.5 mg total) by mouth 2 (two) times daily.   QUEtiapine 200 MG tablet Commonly known as:  SEROQUEL Take 1 tablet (200 mg total) by mouth 2 (two) times daily.   sertraline 100 MG tablet Commonly known as:  ZOLOFT Take 100 mg by mouth daily.      Allergies  Allergen Reactions  . Penicillins Other (See Comments)    Has patient had a PCN reaction causing immediate rash, facial/tongue/throat swelling, SOB or lightheadedness with hypotension: Yes Has patient had a PCN reaction causing severe rash involving mucus membranes or skin necrosis: Yes Has patient had a PCN reaction that required hospitalization Yes Has patient had a PCN reaction occurring within the last 10 years: No If all of the above answers are "NO", then may proceed with Cephalosporin use.    Follow-up Information    Osei-Bonsu, Iona Beard, MD. Schedule an appointment as soon as possible for a visit in 1 week(s).   Specialty:  Internal Medicine Why:  Hospital follow up Contact information: [address] [address] [phone]        Minus Breeding, MD .   Specialty:  Cardiology Contact information: [address] [place] [address] [phone]            The results of significant diagnostics from this hospitalization (including imaging, microbiology, ancillary and laboratory) are listed below for reference.    Significant Diagnostic Studies: Dg Chest 2 View  Result Date: 07/10/2018 CLINICAL  DATA:  72 year old female with chest pain. EXAM: CHEST - 2 VIEW COMPARISON:  Chest CT dated 04/08/2018 FINDINGS: The lungs are clear. There is no pleural effusion or pneumothorax. Right lower lobe calcified granuloma. The cardiac silhouette is within normal limits. No acute osseous pathology. Spinal stimulator over the lower thoracic spine. No acute osseous pathology. Degenerative changes of the spine. IMPRESSION: No active cardiopulmonary disease. Electronically Signed   By: Anner Crete M.D.   On: 07/10/2018 23:46   Nm Myocar Multi W/spect W/wall Motion / Ef  Result Date: 07/11/2018  There was no ST segment deviation noted during stress.  Defect 1: There is a medium defect of mild severity present in the mid anteroseptal, mid inferoseptal, apical septal and apex location.  This is a low risk study.  The left ventricular ejection fraction is moderately decreased (30-44%).  Nuclear stress EF: 36%.  Intermediate risk study due to moderately depressed global left ventricular systolic function. There is a fixed apico-septal defect that does not follow typical coronary distribution and is probably a LBBB-related artifact. Reversible ischemia is not present.    Microbiology: No results found for this or any previous visit (from the past 240 hour(s)).   Labs: Basic Metabolic Panel: Recent Labs  Lab 07/10/18 2254  NA 139  K 3.7  CL 109  CO2 26  GLUCOSE 115*  BUN 17  CREATININE 0.93  CALCIUM 8.5*   Liver Function Tests: No results for input(s): AST, ALT, ALKPHOS, BILITOT, PROT, ALBUMIN in the last 168 hours. No results for input(s): LIPASE, AMYLASE in the last 168 hours. No results for input(s): AMMONIA in the last 168 hours. CBC: Recent Labs  Lab 07/10/18 2254  WBC 9.1  HGB 10.6*  HCT 36.0  MCV 81.6  PLT 305   Cardiac Enzymes: Recent Labs  Lab 07/11/18 0249 07/11/18 1154  TROPONINI <0.03 <0.03   BNP: BNP (last 3 results) Recent Labs    04/07/18 2235 07/10/18 2254   BNP 421.1* 424.8*    ProBNP (last 3 results) No results for input(s): PROBNP in the last 8760 hours.  CBG: No results for input(s): GLUCAP in the last 168 hours.     Signed:  Cristal Ford  [facility] 07/11/2018, 1:41 PM

## 2018-07-11 NOTE — H&P (Signed)
History and Physical    Maria [name] [id number] DOB: 1946-02-07 DOA: 07/10/2018  PCP: Benito Mccreedy, MD  Patient coming from: [place] have personally briefly reviewed patient's old medical records in [place]  Chief Complaint: CP  HPI: Maria [name] is a 72 y.o. female with medical history significant of HTN, COPD, BPD, remote h/o MI per patient, LBBB.  Patient presents to the ED with c/o chest pain.  Onset ~5-6 pm.  Initially intermittent but has become constant for past couple of hours.  Pressure, radiates down L arm.  Associated SOB, nausea, vomiting.  No abd pain.  Feels similar to prior MI several years ago.  No recent travel, surgeries, new leg swelliing.   ED Course: NTG reduced pain from 8/10 to 6/10.  Fentanyl provided nearly complete relief.   Review of Systems: As per HPI otherwise 10 point review of systems negative.   Past Medical History:  Diagnosis Date  . Arthritis   . Bipolar disorder ([place])   . COPD (chronic obstructive pulmonary disease) ([place])   . Fracture, ankle 12/25/2017   closed right ankle fracture  . History of kidney stones   . Hypertension     Past Surgical History:  Procedure Laterality Date  . bladder tack    . BREAST LUMPECTOMY    . CARPAL TUNNEL RELEASE Bilateral   . CHOLECYSTECTOMY    . LAMINECTOMY     neck  . ORIF ANKLE FRACTURE Right 12/26/2017   Procedure: OPEN REDUCTION INTERNAL FIXATION (ORIF) RIGHT BIMALLEOLAR ANKLE FRACTURE;  Surgeon: Marybelle Killings, MD;  Location: [facility];  Service: Orthopedics;  Laterality: Right;  . RADICAL HYSTERECTOMY    . spinal fusion       reports that she has been smoking cigarettes. She has a 28.00 pack-year smoking history. She has never used smokeless tobacco. She reports that she does not drink alcohol or use drugs.  Allergies  Allergen Reactions  . Penicillins Other (See Comments)    Has patient had a PCN reaction causing immediate rash, facial/tongue/throat swelling, SOB  or lightheadedness with hypotension: Yes Has patient had a PCN reaction causing severe rash involving mucus membranes or skin necrosis: Yes Has patient had a PCN reaction that required hospitalization Yes Has patient had a PCN reaction occurring within the last 10 years: No If all of the above answers are "NO", then may proceed with Cephalosporin use.     Family History  Problem Relation Age of Onset  . Hypertension Mother   . Heart failure Neg Hx   . CAD Neg Hx      Prior to Admission medications   Medication Sig Start Date End Date Taking? Authorizing Provider  amitriptyline (ELAVIL) 50 MG tablet Take 50 mg by mouth at bedtime. 03/04/18  Yes [provider]  lisinopril (PRINIVIL,ZESTRIL) 10 MG tablet Take 10 mg by mouth daily.   Yes [provider]  QUEtiapine (SEROQUEL) 200 MG tablet Take 1 tablet (200 mg total) by mouth 2 (two) times daily. 09/04/16  Yes Domenic Moras, PA-C  sertraline (ZOLOFT) 100 MG tablet Take 100 mg by mouth daily.   Yes [provider]    Physical Exam: Vitals:   07/10/18 2315 07/11/18 0046 07/11/18 0100 07/11/18 0130  BP: 119/64 133/69 132/71 (!) 130/48  Pulse: 78 82 87 78  Resp: 12     Temp:      TempSrc:      SpO2: 98% 98% 97% 93%  Weight:  Height:        Constitutional: NAD, calm, comfortable Eyes: PERRL, lids and conjunctivae normal ENMT: Mucous membranes are moist. Posterior pharynx clear of any exudate or lesions.Normal dentition.  Neck: normal, supple, no masses, no thyromegaly Respiratory: clear to auscultation bilaterally, no wheezing, no crackles. Normal respiratory effort. No accessory muscle use.  Cardiovascular: Regular rate and rhythm, no murmurs / rubs / gallops. No extremity edema. 2+ pedal pulses. No carotid bruits.  Abdomen: no tenderness, no masses palpated. No hepatosplenomegaly. Bowel sounds positive.  Musculoskeletal: no clubbing / cyanosis. No joint deformity upper and lower extremities. Good  ROM, no contractures. Normal muscle tone.  Skin: no rashes, lesions, ulcers. No induration Neurologic: CN 2-12 grossly intact. Sensation intact, DTR normal. Strength 5/5 in all 4.  Psychiatric: Normal judgment and insight. Alert and oriented x 3. Normal mood.    Labs on Admission: I have personally reviewed following labs and imaging studies  CBC: Recent Labs  Lab 07/10/18 2254  WBC 9.1  HGB 10.6*  HCT 36.0  MCV 81.6  PLT 518   Basic Metabolic Panel: Recent Labs  Lab 07/10/18 2254  NA 139  K 3.7  CL 109  CO2 26  GLUCOSE 115*  BUN 17  CREATININE 0.93  CALCIUM 8.5*   GFR: Estimated Creatinine Clearance: 58.6 mL/min (by C-G formula based on SCr of 0.93 mg/dL). Liver Function Tests: No results for input(s): AST, ALT, ALKPHOS, BILITOT, PROT, ALBUMIN in the last 168 hours. No results for input(s): LIPASE, AMYLASE in the last 168 hours. No results for input(s): AMMONIA in the last 168 hours. Coagulation Profile: No results for input(s): INR, PROTIME in the last 168 hours. Cardiac Enzymes: No results for input(s): CKTOTAL, CKMB, CKMBINDEX, TROPONINI in the last 168 hours. BNP (last 3 results) No results for input(s): PROBNP in the last 8760 hours. HbA1C: No results for input(s): HGBA1C in the last 72 hours. CBG: No results for input(s): GLUCAP in the last 168 hours. Lipid Profile: No results for input(s): CHOL, HDL, LDLCALC, TRIG, CHOLHDL, LDLDIRECT in the last 72 hours. Thyroid Function Tests: No results for input(s): TSH, T4TOTAL, FREET4, T3FREE, THYROIDAB in the last 72 hours. Anemia Panel: No results for input(s): VITAMINB12, FOLATE, FERRITIN, TIBC, IRON, RETICCTPCT in the last 72 hours. Urine analysis:    Component Value Date/Time   COLORURINE YELLOW 12/25/2017 1048   APPEARANCEUR CLOUDY (A) 12/25/2017 1048   LABSPEC 1.020 12/25/2017 1048   PHURINE 5.0 12/25/2017 1048   GLUCOSEU NEGATIVE 12/25/2017 1048   HGBUR SMALL (A) 12/25/2017 1048   BILIRUBINUR  NEGATIVE 12/25/2017 1048   KETONESUR NEGATIVE 12/25/2017 1048   PROTEINUR NEGATIVE 12/25/2017 1048   NITRITE POSITIVE (A) 12/25/2017 1048   LEUKOCYTESUR MODERATE (A) 12/25/2017 1048    Radiological Exams on Admission: Dg Chest 2 View  Result Date: 07/10/2018 CLINICAL DATA:  72 year old female with chest pain. EXAM: CHEST - 2 VIEW COMPARISON:  Chest CT dated 04/08/2018 FINDINGS: The lungs are clear. There is no pleural effusion or pneumothorax. Right lower lobe calcified granuloma. The cardiac silhouette is within normal limits. No acute osseous pathology. Spinal stimulator over the lower thoracic spine. No acute osseous pathology. Degenerative changes of the spine. IMPRESSION: No active cardiopulmonary disease. Electronically Signed   By: Anner Crete M.D.   On: 07/10/2018 23:46    EKG: Independently reviewed.  Assessment/Plan Principal Problem:   Chest pain, rule out acute myocardial infarction Active Problems:   Essential hypertension   Bipolar disorder ([place])    1. CP  r/o - 1. CP obs pathway 2. Serial trops 3. Tele monitor 4. NPO 5. Cards eval in AM 2. HTN - continue home BP meds 3. BPD - continue seroquel, amitriptyline, and zoloft  DVT prophylaxis: Lovenox Code Status: Full Family Communication: No family in room Disposition Plan: Home after admit Consults called: Message sent to P. [facility] for cards eval in AM Admission status: Place in [place], [facility] Pager [phone] Only works nights!  If 7AM-7PM, please contact the primary day team physician taking care of patient  [email or web address] Password [email or web address]  07/11/2018, 1:44 AM

## 2018-07-11 NOTE — Progress Notes (Signed)
Reviewed discharge instructions with Patient, verbalized understanding. IV d/ced without problem.  D/ced via WC.

## 2018-07-11 NOTE — Discharge Instructions (Signed)
Nonspecific Chest Pain Chest pain can be caused by many different conditions. There is a chance that your pain could be related to something serious, such as a heart attack or a blood clot in your lungs. Chest pain can also be caused by conditions that are not life-threatening. If you have chest pain, it is very important to follow up with your doctor. Follow these instructions at home: Medicines  If you were prescribed an antibiotic medicine, take it as told by your doctor. Do not stop taking the antibiotic even if you start to feel better.  Take over-the-counter and prescription medicines only as told by your doctor. Lifestyle  Do not use any products that contain nicotine or tobacco, such as cigarettes and e-cigarettes. If you need help quitting, ask your doctor.  Do not drink alcohol.  Make lifestyle changes as told by your doctor. These may include: ? Getting regular exercise. Ask your doctor for some activities that are safe for you. ? Eating a heart-healthy diet. A diet specialist (dietitian) can help you to learn healthy eating options. ? Staying at a healthy weight. ? Managing diabetes, if needed. ? Lowering your stress, as with deep breathing or spending time in nature. General instructions  Avoid any activities that make you feel chest pain.  If your chest pain is because of heartburn: ? Raise (elevate) the head of your bed about 6 inches (15 cm). You can do this by putting blocks under the bed legs at the head of the bed. ? Do not sleep with extra pillows under your head. That does not help heartburn.  Keep all follow-up visits as told by your doctor. This is important. This includes any further testing if your chest pain does not go away. Contact a doctor if:  Your chest pain does not go away.  You have a rash with blisters on your chest.  You have a fever.  You have chills. Get help right away if:  Your chest pain is worse.  You have a cough that gets worse, or  you cough up blood.  You have very bad (severe) pain in your belly (abdomen).  You are very weak.  You pass out (faint).  You have either of these for no clear reason: ? Sudden chest discomfort. ? Sudden discomfort in your arms, back, neck, or jaw.  You have shortness of breath at any time.  You suddenly start to sweat, or your skin gets clammy.  You feel sick to your stomach (nauseous).  You throw up (vomit).  You suddenly feel light-headed or dizzy.  Your heart starts to beat fast, or it feels like it is skipping beats. These symptoms may be an emergency. Do not wait to see if the symptoms will go away. Get medical help right away. Call your local emergency services (911 in the [place] Do not drive yourself to the hospital. This information is not intended to replace advice given to you by your health care provider. Make sure you discuss any questions you have with your health care provider. Document Released: 02/14/2008 Document Revised: 05/22/2016 Document Reviewed: 05/22/2016 Elsevier Interactive Patient Education  2017 [employer].  Heart Failure Heart failure means your heart has trouble pumping blood. This makes it hard for your body to work well. Heart failure is usually a long-term (chronic) condition. You must take good care of yourself and follow your doctor's treatment plan. Follow these instructions at home:  Take your heart medicine as told by your doctor. ? Do  not stop taking medicine unless your doctor tells you to. ? Do not skip any dose of medicine. ? Refill your medicines before they run out. ? Take other medicines only as told by your doctor or pharmacist.  Stay active if told by your doctor. The elderly and people with severe heart failure should talk with a doctor about physical activity.  Eat heart-healthy foods. Choose foods that are without trans fat and are low in saturated fat, cholesterol, and salt (sodium). This includes fresh or frozen fruits  and vegetables, fish, lean meats, fat-free or low-fat dairy foods, whole grains, and high-fiber foods. Lentils and dried peas and beans (legumes) are also good choices.  Limit salt if told by your doctor.  Cook in a healthy way. Roast, grill, broil, bake, poach, steam, or stir-fry foods.  Limit fluids as told by your doctor.  Weigh yourself every morning. Do this after you pee (urinate) and before you eat breakfast. Write down your weight to give to your doctor.  Take your blood pressure and write it down if your doctor tells you to.  Ask your doctor how to check your pulse. Check your pulse as told.  Lose weight if told by your doctor.  Stop smoking or chewing tobacco. Do not use gum or patches that help you quit without your doctor's approval.  Schedule and go to doctor visits as told.  Nonpregnant women should have no more than 1 drink a day. Men should have no more than 2 drinks a day. Talk to your doctor about drinking alcohol.  Stop illegal drug use.  Stay current with shots (immunizations).  Manage your health conditions as told by your doctor.  Learn to manage your stress.  Rest when you are tired.  If it is really hot outside: ? Avoid intense activities. ? Use air conditioning or fans, or get in a cooler place. ? Avoid caffeine and alcohol. ? Wear loose-fitting, lightweight, and light-colored clothing.  If it is really cold outside: ? Avoid intense activities. ? Layer your clothing. ? Wear mittens or gloves, a hat, and a scarf when going outside. ? Avoid alcohol.  Learn about heart failure and get support as needed.  Get help to maintain or improve your quality of life and your ability to care for yourself as needed. Contact a doctor if:  You gain weight quickly.  You are more short of breath than usual.  You cannot do your normal activities.  You tire easily.  You cough more than normal, especially with activity.  You have any or more puffiness  (swelling) in areas such as your hands, feet, ankles, or belly (abdomen).  You cannot sleep because it is hard to breathe.  You feel like your heart is beating fast (palpitations).  You get dizzy or light-headed when you stand up. Get help right away if:  You have trouble breathing.  There is a change in mental status, such as becoming less alert or not being able to focus.  You have chest pain or discomfort.  You faint. This information is not intended to replace advice given to you by your health care provider. Make sure you discuss any questions you have with your health care provider. Document Released: 06/06/2008 Document Revised: 02/03/2016 Document Reviewed: 10/14/2012 Elsevier Interactive Patient Education  2017 [employer].

## 2018-07-11 NOTE — Consult Note (Addendum)
Cardiology Consult    Patient ID: [name] MRN: [id number], DOB/AGE: 12-Mar-1946   Admit date: 07/10/2018 Date of Consult: 07/11/2018  Primary Physician: Benito Mccreedy, MD Primary Cardiologist: Dr. Percival Spanish Requesting Provider: Dr. Ree Kida  Patient Profile    Maria [name] is a 72 y.o. female with a history of hypertension, COPD, bipolar disorder, and possible CHF who is being seen today for the evaluation of chest pain at the request of Dr. Ree Kida.  History of Present Illness    Patient is 72 year old female with a history of hypertension, COPD, bipolar disease, and possible CHF. She was admitted from 04/07/2018 to 04/09/2018 after presenting with shortness of breath. She also was having chest pain at that time. BNP was elevated. An Echo was performed but unfortunately there was very poor acoustical windows which precluded any accurate assessment of EF. Cardiology was consulted and recommended a Lexiscan Myoview. However, she returned without the test because she was too anxious. She has not had any chest pain or shortness of breath since then until yesterday.   Patient returned to [facility] ED yesterday via EMS for chest pain with associated shortness of breath. Patient states she was relaxing in the sitting room of the homeless shelter when she started to feel "paranoid" and felt like everyone was staring at her. She then noticed some left sided chest pressure with radiation to her left arm as well as some numbness/tingling in her left hand. She states she has had the numbness/tingling before. She ranks the pain as a 7-8 on the pain scale and states it felt like indigestion. She took a Pepcid but that did not help. She states the pain lasted for about 4 hours until she got Fentanyl in the ED. She reports associated shortness of breath, dizziness, and palpitations with the pain. She denies any associated diaphoresis or nausea but did have one isolated episode of vomiting yesterday  morning after eating breakfast. She did have the flu about 1 month ago with fever, chills, vomiting, and diarrhea but she denies any illness since then. She describes the pain as similar to what she experienced in July. She thinks she was having another panic attack but EMS recommended that she come to the hospital for further evaluation.   Upon arrival to the ED, EKG showed normal sinus rhythm with a known left LBBB but no significant ST changes compared to prior tracings. I-stat troponin negative. Chest x-ray showed no acute findings. BNP elevated at 424.8 (421.1 in 03/2018). WBC 9.1, Hgb 10.6, Plts 305. Na 139, K 3.7, Glucose 115, Scr 0.93.  Currently, patient is asymptomatic and denies any chest pain or shortness of breath.  Of note, patient reports smoking since she was 72 years old. She once smoked 2 packs per day but now is down to 10 cigarettes per day. She denies any alcohol or drug use. She reports she had a heart catheterization about 30 years ago in [place] and states it was normal at that time.   Past Medical History   Past Medical History:  Diagnosis Date  . Arthritis   . Bipolar disorder ([place])   . COPD (chronic obstructive pulmonary disease) ([place])   . Fracture, ankle 12/25/2017   closed right ankle fracture  . History of kidney stones   . Hypertension     Past Surgical History:  Procedure Laterality Date  . bladder tack    . BREAST LUMPECTOMY    . CARPAL TUNNEL RELEASE Bilateral   . CHOLECYSTECTOMY    .  LAMINECTOMY     neck  . ORIF ANKLE FRACTURE Right 12/26/2017   Procedure: OPEN REDUCTION INTERNAL FIXATION (ORIF) RIGHT BIMALLEOLAR ANKLE FRACTURE;  Surgeon: Marybelle Killings, MD;  Location: [facility];  Service: Orthopedics;  Laterality: Right;  . RADICAL HYSTERECTOMY    . spinal fusion       Allergies  Allergies  Allergen Reactions  . Penicillins Other (See Comments)    Has patient had a PCN reaction causing immediate rash, facial/tongue/throat swelling, SOB or  lightheadedness with hypotension: Yes Has patient had a PCN reaction causing severe rash involving mucus membranes or skin necrosis: Yes Has patient had a PCN reaction that required hospitalization Yes Has patient had a PCN reaction occurring within the last 10 years: No If all of the above answers are "NO", then may proceed with Cephalosporin use.     Inpatient Medications    . amitriptyline  50 mg Oral QHS  . enoxaparin (LOVENOX) injection  40 mg Subcutaneous QHS  . lisinopril  10 mg Oral Daily  . QUEtiapine  200 mg Oral BID  . sertraline  100 mg Oral Daily    Family History    Family History  Problem Relation Age of Onset  . Hypertension Mother   . Heart failure Neg Hx   . CAD Neg Hx    She indicated that her mother is deceased. She indicated that her father is deceased. She indicated that the status of her neg hx is unknown.   Social History    Social History   Socioeconomic History  . Marital status: Widowed    Spouse name: Not on file  . Number of children: Not on file  . Years of education: Not on file  . Highest education level: Not on file  Occupational History  . Occupation: Retired     Comment: Radio broadcast assistant at [employer]  Social Needs  . Financial resource strain: Not on file  . Food insecurity:    Worry: Not on file    Inability: Not on file  . Transportation needs:    Medical: Not on file    Non-medical: Not on file  Tobacco Use  . Smoking status: Current Every Day Smoker    Packs/day: 0.50    Years: 56.00    Pack years: 28.00    Types: Cigarettes  . Smokeless tobacco: Never Used  . Tobacco comment: down to 3 cigarettes/day  Substance and Sexual Activity  . Alcohol use: No  . Drug use: No  . Sexual activity: Not on file  Lifestyle  . Physical activity:    Days per week: Not on file    Minutes per session: Not on file  . Stress: Not on file  Relationships  . Social connections:    Talks on phone: Not on file    Gets together: Not on  file    Attends religious service: Not on file    Active member of club or organization: Not on file    Attends meetings of clubs or organizations: Not on file    Relationship status: Not on file  . Intimate partner violence:    Fear of current or ex partner: Not on file    Emotionally abused: Not on file    Physically abused: Not on file    Forced sexual activity: Not on file  Other Topics Concern  . Not on file  Social History Narrative   Lives with husband     Review of Systems  Review of Systems  Constitutional: Negative for chills, diaphoresis and fever.  HENT: Negative for congestion.   Respiratory: Positive for cough ("smoker's cough"), sputum production (clear phlegm) and shortness of breath.   Cardiovascular: Positive for chest pain, palpitations and orthopnea. Negative for leg swelling and PND.  Gastrointestinal: Positive for nausea and vomiting. Negative for abdominal pain and blood in stool.  Genitourinary: Negative for hematuria.  Musculoskeletal: Negative for myalgias.  Neurological: Positive for dizziness, tingling and weakness.  Endo/Heme/Allergies: Does not bruise/bleed easily.  Psychiatric/Behavioral: Positive for substance abuse (tobacco abuse).    Physical Exam    Blood pressure 137/74, pulse 77, temperature 98.3 F (36.8 C), temperature source Oral, resp. rate 16, height 5\' 7"  (1.702 m), weight 74.8 kg, SpO2 98 %.  General: 72 y.o. female resting comfortably in no acute distress. Pleasant and cooperative. HEENT: Normal  Neck: Supple. No bruits or JVD appreciated. Lungs: No increased work of breathing. Clear to auscultation bilaterally. No wheezes, rhonchi, or rales. Heart: RRR. Distinct S1 and S2. No murmurs, gallops, or rubs.  Abdomen: Soft, non-distended, and non-tender to palpation. Bowel sounds present.   Extremities: No significant lower extremity edema. Distal pedal pulses and radial pulses 2+ and equal bilaterally. Neuro: Alert and oriented x3.  No focal deficits. Moves all extremities spontaneously. Psych: Normal affect.  Labs    Troponin [facility] of Care Test) Recent Labs    07/10/18 2255  TROPIPOC 0.01   Recent Labs    07/11/18 0249  TROPONINI <0.03   Lab Results  Component Value Date   WBC 9.1 07/10/2018   HGB 10.6 (L) 07/10/2018   HCT 36.0 07/10/2018   MCV 81.6 07/10/2018   PLT 305 07/10/2018    Recent Labs  Lab 07/10/18 2254  NA 139  K 3.7  CL 109  CO2 26  BUN 17  CREATININE 0.93  CALCIUM 8.5*  GLUCOSE 115*   No results found for: CHOL, HDL, LDLCALC, TRIG Lab Results  Component Value Date   DDIMER 0.76 (H) 04/07/2018     Radiology Studies    Dg Chest 2 View  Result Date: 07/10/2018 CLINICAL DATA:  72 year old female with chest pain. EXAM: CHEST - 2 VIEW COMPARISON:  Chest CT dated 04/08/2018 FINDINGS: The lungs are clear. There is no pleural effusion or pneumothorax. Right lower lobe calcified granuloma. The cardiac silhouette is within normal limits. No acute osseous pathology. Spinal stimulator over the lower thoracic spine. No acute osseous pathology. Degenerative changes of the spine. IMPRESSION: No active cardiopulmonary disease. Electronically Signed   By: Anner Crete M.D.   On: 07/10/2018 23:46    EKG     EKG: EKG was personally reviewed and demonstrates: normal sinus rhythm, rate 88 bpm, with known LBBB and no significant ST changes from prior tracing in 03/2018  Telemetry: Telemetry was personally reviewed and demonstrates: normal sinus rhythm / mild sinus tachycardia with rate ranging from 80s to low 100s (briefly)  Cardiac Imaging    Echocardiogram 04/08/2018: Study Conclusions - Left ventricle: The cavity size was normal. Systolic function   overall appears reduced Images were inadequate for LV wall motion   assessment even with definity There was an increased relative   contribution of atrial contraction to ventricular filling.   Doppler parameters are consistent with  abnormal left ventricular   relaxation (grade 1 diastolic dysfunction). Doppler parameters   are consistent with high ventricular filling pressure. - Aortic valve: Poorly visualized. - Aorta: The aorta was poorly visualized. - Mitral valve: Poorly  visualized. - Pulmonic valve: Poorly visualized. - Pulmonary arteries: Systolic pressure could not be accurately   estimated.  Impressions: - Very poor acoustical windows preclude accurate assessment of   EF(even with definity contrast), wall motion and valvular   function. Recommend cardiac MRI.  Assessment & Plan    1. Chest Pain - Patient presented to the ED for evaluation of chest pain with radiation to left arm and associated shortness of breath. Patient thinks she was just having a panic attack.  - Patient reports she had a heart catheterization about 30 years ago in [facility] which she states was normal. - EKG showed normal sinus rhythm with known LBBB but no acute ST/T changes from prior tracings. - I-stat troponin negative at 0.01. First repeat troponin also negative. - Patient presented to the ED in 03/2018 for shortness of breath. Echo at that time had poor acoustic windows that precluded any accurate assessment of EF but showed grade 1 diastolic dysfunction. Tried to get a [employer] during that admission but patient was too anxious. - Would like to try for another Myoview if patient is able. Discussed with MD. Will prescribe one time dose of Ativan 1mg  to be taken if needed for anxiety during stress test only.  Signed, Darreld Mclean, PA-C 07/11/2018, 7:38 AM  For questions or updates, please contact   Please consult [email or web address] for contact info under Cardiology/STEMI. ---------------------------------------------------------------------------------------------   History and all data above reviewed.  Patient examined.  I agree with the findings as above.  Bridey Kalil is a pleasant 72 year old female presenting today  with chest and arm pain after an acute anxiety attack. She felt as if everyone in the room was staring at her, and afterward experienced left chest pressure with left arm radiation. She felt she would be fine but EMS recommended ED evaluation. She is currently chest pain free.  Constitutional: No acute distress Eyes: pupils equally round and reactive to light, sclera non-icteric, normal conjunctiva and lids ENMT: moist mucous membranes Cardiovascular: regular rhythm, normal rate, no murmurs. S1 and S2 normal. Radial pulses normal bilaterally. No jugular venous distention.  Respiratory: clear to auscultation bilaterally GI : normal bowel sounds, soft and nontender. No distention.   MSK: extremities warm, well perfused. No edema.  LYMPH: No lymphadenopathy noted of the head and neck NEURO: grossly nonfocal exam, moves all extremities. PSYCH: alert and oriented x 3, normal mood and affect.   All available labs, radiology testing, previous records reviewed. Agree with documented assessment and plan of my colleague as stated above with the following additions or changes:  Principal Problem:   Chest pain, rule out acute myocardial infarction Active Problems:   Essential hypertension   Bipolar disorder ([place])   Plan: She was intended for an ischemia evaluation in July but did not follow up due to cost. She also was not able to perform stress before due to anxiety when arriving for stress.  We will plan or stress Myoview today to complete her ischemia evaluation. If this is normal, she does not likely need further inpatient cardiac testing.   Second troponin negative.   BNP is elevated as it was in July. Consider addition of low dose furosemide or thiazide diuretic for both blood pressure effect as well as diuretic effect.  Elouise Munroe, MD [facility] 10:01 AM  07/11/2018  ADDENDUM: No reversible ischemia on stress.  She has a fixed perfusion defect which is likely related to left bundle  branch block and not felt  to be ischemic in nature.  I have independently reviewed the left ventricular enhancement images from her echocardiogram in July.  While challenging to accurately quantitate her ejection fraction, it does appear quite reduced, and may be somewhere in the 20 to 25% range.  Her stress nuclear study today quantitates at 36%.  This suggest that her reduced ejection fraction is not new, and is unlikely to be associated with ischemia.  She will require adequate heart failure therapy, and after discussing with Dr. Ree Kida, she will be starting a low-dose of beta-blockade, in addition to the patient's current prescription for lisinopril.  Metoprolol suctioning was listed on her previous hospitalization, but was not a dismissal medication.  It is not entirely clear if she had difficulties taking beta-blocker.  No other cardiovascular testing is required at this point.

## 2018-07-11 NOTE — Plan of Care (Signed)

## 2018-08-02 ENCOUNTER — Ambulatory Visit: Payer: Medicare HMO | Admitting: Cardiology

## 2018-09-30 ENCOUNTER — Emergency Department (HOSPITAL_COMMUNITY)
Admission: EM | Admit: 2018-09-30 | Discharge: 2018-09-30 | Disposition: A | Discharge status: Home / Self Care | Payer: Medicare HMO | Attending: Emergency Medicine | Admitting: Emergency Medicine

## 2018-09-30 ENCOUNTER — Emergency Department (HOSPITAL_COMMUNITY): Payer: Medicare HMO

## 2018-09-30 ENCOUNTER — Encounter (HOSPITAL_COMMUNITY): Payer: Self-pay

## 2018-09-30 ENCOUNTER — Other Ambulatory Visit: Payer: Self-pay

## 2018-09-30 DIAGNOSIS — J449 Chronic obstructive pulmonary disease, unspecified: Secondary | ICD-10-CM | POA: Diagnosis not present

## 2018-09-30 DIAGNOSIS — I130 Hypertensive heart and chronic kidney disease with heart failure and stage 1 through stage 4 chronic kidney disease, or unspecified chronic kidney disease: Secondary | ICD-10-CM | POA: Insufficient documentation

## 2018-09-30 DIAGNOSIS — Z9049 Acquired absence of other specified parts of digestive tract: Secondary | ICD-10-CM | POA: Diagnosis not present

## 2018-09-30 DIAGNOSIS — I509 Heart failure, unspecified: Secondary | ICD-10-CM | POA: Insufficient documentation

## 2018-09-30 DIAGNOSIS — R05 Cough: Secondary | ICD-10-CM | POA: Diagnosis present

## 2018-09-30 DIAGNOSIS — J111 Influenza due to unidentified influenza virus with other respiratory manifestations: Secondary | ICD-10-CM | POA: Insufficient documentation

## 2018-09-30 DIAGNOSIS — R69 Illness, unspecified: Secondary | ICD-10-CM

## 2018-09-30 DIAGNOSIS — F17210 Nicotine dependence, cigarettes, uncomplicated: Secondary | ICD-10-CM | POA: Diagnosis not present

## 2018-09-30 DIAGNOSIS — N183 Chronic kidney disease, stage 3 (moderate): Secondary | ICD-10-CM | POA: Insufficient documentation

## 2018-09-30 DIAGNOSIS — F419 Anxiety disorder, unspecified: Secondary | ICD-10-CM | POA: Diagnosis not present

## 2018-09-30 DIAGNOSIS — F319 Bipolar disorder, unspecified: Secondary | ICD-10-CM | POA: Diagnosis not present

## 2018-09-30 DIAGNOSIS — Z79899 Other long term (current) drug therapy: Secondary | ICD-10-CM | POA: Diagnosis not present

## 2018-09-30 MED ORDER — ALBUTEROL SULFATE HFA 108 (90 BASE) MCG/ACT IN AERS
2.0000 | INHALATION_SPRAY | RESPIRATORY_TRACT | Status: DC | PRN
  Administered 2018-09-30: 2 via RESPIRATORY_TRACT
  Filled 2018-09-30: qty 6.7

## 2018-09-30 MED ORDER — DEXAMETHASONE 4 MG PO TABS
10.0000 mg | ORAL_TABLET | Freq: Once | ORAL | Status: AC
  Administered 2018-09-30: 10 mg via ORAL
  Filled 2018-09-30: qty 2

## 2018-09-30 MED ORDER — IPRATROPIUM-ALBUTEROL 0.5-2.5 (3) MG/3ML IN SOLN
3.0000 mL | Freq: Once | RESPIRATORY_TRACT | Status: AC
  Administered 2018-09-30: 3 mL via RESPIRATORY_TRACT
  Filled 2018-09-30: qty 3

## 2018-09-30 NOTE — ED Provider Notes (Signed)
[facility] DEPT Provider Note   CSN: [id number] Arrival date & time: 09/30/18  0356     History   Chief Complaint Chief Complaint  Patient presents with  . Shortness of Breath    HPI Maria [name] is a 73 y.o. female.  The history is provided by the patient.  She has history of hypertension, COPD, bipolar disorder, heart failure, chronic kidney disease and comes in complaining of cough and dyspnea.  She has been sick for approximately 3 days.  Cough is nonproductive.  He is complaining of being short of breath.  She is having chills but has not been able to take her temperature.  She complains of generalized body aches.  There has been no vomiting or diarrhea.  She has had sick contacts.  She continues to smoke 1/4 pack of cigarettes a day.  She has not had any treatment.  Past Medical History:  Diagnosis Date  . Arthritis   . Bipolar disorder ([place])   . COPD (chronic obstructive pulmonary disease) ([place])   . Fracture, ankle 12/25/2017   closed right ankle fracture  . History of kidney stones   . Hypertension     Patient Active Problem List   Diagnosis Date Noted  . Chest pain, rule out acute myocardial infarction 07/11/2018  . Anxiety state   . CHF (congestive heart failure) ([place]) 04/08/2018  . Essential hypertension 04/08/2018  . COPD (chronic obstructive pulmonary disease) ([place]) 04/08/2018  . Bipolar disorder ([place]) 04/08/2018  . CKD (chronic kidney disease) stage 3, GFR 30-59 ml/min ([facility]) 04/08/2018  . Tobacco dependence 04/08/2018  . Closed right ankle fracture 12/25/2017    Past Surgical History:  Procedure Laterality Date  . bladder tack    . BREAST LUMPECTOMY    . CARPAL TUNNEL RELEASE Bilateral   . CHOLECYSTECTOMY    . LAMINECTOMY     neck  . ORIF ANKLE FRACTURE Right 12/26/2017   Procedure: OPEN REDUCTION INTERNAL FIXATION (ORIF) RIGHT BIMALLEOLAR ANKLE FRACTURE;  Surgeon: Marybelle Killings, MD;  Location: [facility];  Service:  Orthopedics;  Laterality: Right;  . RADICAL HYSTERECTOMY    . spinal fusion       OB History   No obstetric history on file.      Home Medications    Prior to Admission medications   Medication Sig Start Date End Date Taking? Authorizing Provider  amitriptyline (ELAVIL) 50 MG tablet Take 50 mg by mouth at bedtime. 03/04/18   [provider]  furosemide (LASIX) 20 MG tablet Take 1 tablet (20 mg total) by mouth daily. 07/11/18   Mikhail, Velta Addison, DO  lisinopril (PRINIVIL,ZESTRIL) 10 MG tablet Take 10 mg by mouth daily.    [provider]  metoprolol tartrate (LOPRESSOR) 25 MG tablet Take 0.5 tablets (12.5 mg total) by mouth 2 (two) times daily. 07/11/18   Mikhail, Velta Addison, DO  QUEtiapine (SEROQUEL) 200 MG tablet Take 1 tablet (200 mg total) by mouth 2 (two) times daily. 09/04/16   Domenic Moras, PA-C  sertraline (ZOLOFT) 100 MG tablet Take 100 mg by mouth daily.    [provider]    Family History Family History  Problem Relation Age of Onset  . Hypertension Mother   . Heart failure Neg Hx   . CAD Neg Hx     Social History Social History   Tobacco Use  . Smoking status: Current Every Day Smoker    Packs/day: 0.50    Years: 56.00    Pack years:  28.00    Types: Cigarettes  . Smokeless tobacco: Never Used  . Tobacco comment: down to 3 cigarettes/day  Substance Use Topics  . Alcohol use: No  . Drug use: No     Allergies   Penicillins   Review of Systems Review of Systems  All other systems reviewed and are negative.    Physical Exam Updated Vital Signs BP 131/79 (BP Location: Left Arm)   Pulse 88   Temp 98.1 F (36.7 C) (Oral)   Resp 19   SpO2 97%   Physical Exam Vitals signs and nursing note reviewed.    73 year old female, resting comfortably and in no acute distress. Vital signs are normal. Oxygen saturation is 97%, which is normal. Head is normocephalic and atraumatic. PERRLA, EOMI. Oropharynx is clear. Neck is nontender  and supple without adenopathy or JVD. Back is nontender and there is no CVA tenderness. Lungs have diminished airflow but with a prolonged exhalation phase without overt rales, wheezes, or rhonchi. Chest is nontender. Heart has regular rate and rhythm without murmur. Abdomen is soft, flat, nontender without masses or hepatosplenomegaly and peristalsis is normoactive. Extremities have no cyanosis or edema, full range of motion is present. Skin is warm and dry without rash. Neurologic: Mental status is normal, cranial nerves are intact, there are no motor or sensory deficits.  ED Treatments / Results   Dg Chest 2 View  Result Date: 09/30/2018 CLINICAL DATA:  Cough EXAM: CHEST - 2 VIEW COMPARISON:  07/10/2018 FINDINGS: Generalized interstitial coarsening with a few Kerley lines seen on the lateral view. Normal heart size and mediastinal contours. No air bronchogram, effusion, or pneumothorax. Calcified granuloma over the right lower lobe and right hilum. Dorsal column stimulator. Artifact from EKG leads. IMPRESSION: Bronchitic or congestive interstitial coarsening. Electronically Signed   By: Monte Fantasia M.D.   On: 09/30/2018 05:04    Procedures Procedures   Medications Ordered in ED Medications  dexamethasone (DECADRON) tablet 10 mg (has no administration in time range)  albuterol (PROVENTIL HFA;VENTOLIN HFA) 108 (90 Base) MCG/ACT inhaler 2 puff (has no administration in time range)  ipratropium-albuterol (DUONEB) 0.5-2.5 (3) MG/3ML nebulizer solution 3 mL (3 mLs Nebulization Given 09/30/18 0514)     Initial Impression / Assessment and Plan / ED Course  I have reviewed the triage vital signs and the nursing notes.  Pertinent labs & imaging results that were available during my care of the patient were reviewed by me and considered in my medical decision making (see chart for details).  Influenza-like illness.  Will send for chest x-ray to rule out pneumonia, give nebulizer treatment  with albuterol and ipratropium to treat dyspnea.  This should also help her cough.  Old records are reviewed, and she does have prior hospitalization for respiratory failure.  Chest x-ray shows no evidence of pneumonia.  He had significant subjective improvement with nebulizer treatment with albuterol and ipratropium.  She is homeless.  She is given resource guide for homeless shelters, given a dose of dexamethasone in the ED and given an albuterol inhaler to use as needed.  Return precautions discussed.  Final Clinical Impressions(s) / ED Diagnoses   Final diagnoses:  Influenza-like illness    ED Discharge Orders    None       Delora Fuel, MD 58/09/98 [phone]

## 2018-09-30 NOTE — ED Triage Notes (Signed)
PT BIB [facility] with c/o SHOB that has gotten worse throughout the day. Pt is homeless and was found outside of a shelter per EMS. Possibly been around sick people. EMS noted Rhonchi.

## 2018-09-30 NOTE — ED Notes (Signed)
Bed: BJ62 Expected date:  Expected time:  Means of arrival:  Comments: 73 y/o M SOB duoneb

## 2018-09-30 NOTE — Discharge Instructions (Addendum)
Use the inhaler - two puffs every four hours as needed for cough or difficulty breathing.

## 2018-09-30 NOTE — Progress Notes (Signed)
CSW aware of consult for homeless shelter resources. CSW spoke with patient at bedside who reports she and her husband have been homeless since August. Per patient, she currently has a bed at [facility] but has no way to get there. CSW provided patient with a bus pass. Patient reported her husband has had to stay somewhere else because they do not have a bed available where patient is staying. CSW reviewed list of homeless shelter with patient and provided information on the [facility] and their emergency shelter. Patient voiced no further CSW needs. Please reconsult if needs arise.  Ollen Barges, [place] Work Department  [employer]  [phone]

## 2018-10-06 ENCOUNTER — Other Ambulatory Visit: Payer: Self-pay

## 2018-10-06 ENCOUNTER — Encounter (HOSPITAL_COMMUNITY): Payer: Self-pay | Admitting: Pharmacy Technician

## 2018-10-06 ENCOUNTER — Inpatient Hospital Stay (HOSPITAL_COMMUNITY)
Admission: EM | Admit: 2018-10-06 | Discharge: 2018-10-14 | DRG: 291 | Disposition: A | Discharge status: Home / Self Care | Payer: Medicare HMO | Attending: Internal Medicine | Admitting: Internal Medicine

## 2018-10-06 ENCOUNTER — Emergency Department (HOSPITAL_COMMUNITY): Payer: Medicare HMO

## 2018-10-06 DIAGNOSIS — R0602 Shortness of breath: Secondary | ICD-10-CM | POA: Diagnosis not present

## 2018-10-06 DIAGNOSIS — I248 Other forms of acute ischemic heart disease: Secondary | ICD-10-CM | POA: Diagnosis present

## 2018-10-06 DIAGNOSIS — I130 Hypertensive heart and chronic kidney disease with heart failure and stage 1 through stage 4 chronic kidney disease, or unspecified chronic kidney disease: Principal | ICD-10-CM | POA: Diagnosis present

## 2018-10-06 DIAGNOSIS — R269 Unspecified abnormalities of gait and mobility: Secondary | ICD-10-CM | POA: Diagnosis present

## 2018-10-06 DIAGNOSIS — J440 Chronic obstructive pulmonary disease with acute lower respiratory infection: Secondary | ICD-10-CM | POA: Diagnosis not present

## 2018-10-06 DIAGNOSIS — Z87442 Personal history of urinary calculi: Secondary | ICD-10-CM | POA: Diagnosis not present

## 2018-10-06 DIAGNOSIS — F319 Bipolar disorder, unspecified: Secondary | ICD-10-CM | POA: Diagnosis present

## 2018-10-06 DIAGNOSIS — R5381 Other malaise: Secondary | ICD-10-CM | POA: Diagnosis present

## 2018-10-06 DIAGNOSIS — I10 Essential (primary) hypertension: Secondary | ICD-10-CM | POA: Diagnosis present

## 2018-10-06 DIAGNOSIS — M1990 Unspecified osteoarthritis, unspecified site: Secondary | ICD-10-CM | POA: Diagnosis present

## 2018-10-06 DIAGNOSIS — I509 Heart failure, unspecified: Secondary | ICD-10-CM

## 2018-10-06 DIAGNOSIS — I428 Other cardiomyopathies: Secondary | ICD-10-CM | POA: Diagnosis present

## 2018-10-06 DIAGNOSIS — Z8249 Family history of ischemic heart disease and other diseases of the circulatory system: Secondary | ICD-10-CM

## 2018-10-06 DIAGNOSIS — F17210 Nicotine dependence, cigarettes, uncomplicated: Secondary | ICD-10-CM | POA: Diagnosis present

## 2018-10-06 DIAGNOSIS — R7989 Other specified abnormal findings of blood chemistry: Secondary | ICD-10-CM

## 2018-10-06 DIAGNOSIS — I472 Ventricular tachycardia: Secondary | ICD-10-CM | POA: Diagnosis not present

## 2018-10-06 DIAGNOSIS — E876 Hypokalemia: Secondary | ICD-10-CM | POA: Diagnosis present

## 2018-10-06 DIAGNOSIS — I5043 Acute on chronic combined systolic (congestive) and diastolic (congestive) heart failure: Secondary | ICD-10-CM | POA: Diagnosis present

## 2018-10-06 DIAGNOSIS — I420 Dilated cardiomyopathy: Secondary | ICD-10-CM | POA: Diagnosis not present

## 2018-10-06 DIAGNOSIS — J069 Acute upper respiratory infection, unspecified: Secondary | ICD-10-CM

## 2018-10-06 DIAGNOSIS — N183 Chronic kidney disease, stage 3 unspecified: Secondary | ICD-10-CM | POA: Diagnosis present

## 2018-10-06 DIAGNOSIS — Y95 Nosocomial condition: Secondary | ICD-10-CM | POA: Diagnosis not present

## 2018-10-06 DIAGNOSIS — J189 Pneumonia, unspecified organism: Secondary | ICD-10-CM | POA: Diagnosis not present

## 2018-10-06 DIAGNOSIS — I5023 Acute on chronic systolic (congestive) heart failure: Secondary | ICD-10-CM | POA: Diagnosis not present

## 2018-10-06 DIAGNOSIS — J9601 Acute respiratory failure with hypoxia: Secondary | ICD-10-CM | POA: Diagnosis present

## 2018-10-06 DIAGNOSIS — Z90710 Acquired absence of both cervix and uterus: Secondary | ICD-10-CM

## 2018-10-06 DIAGNOSIS — J449 Chronic obstructive pulmonary disease, unspecified: Secondary | ICD-10-CM | POA: Diagnosis present

## 2018-10-06 DIAGNOSIS — I447 Left bundle-branch block, unspecified: Secondary | ICD-10-CM | POA: Diagnosis present

## 2018-10-06 DIAGNOSIS — T50916A Underdosing of multiple unspecified drugs, medicaments and biological substances, initial encounter: Secondary | ICD-10-CM | POA: Diagnosis present

## 2018-10-06 DIAGNOSIS — Z981 Arthrodesis status: Secondary | ICD-10-CM | POA: Diagnosis not present

## 2018-10-06 DIAGNOSIS — J431 Panlobular emphysema: Secondary | ICD-10-CM | POA: Diagnosis not present

## 2018-10-06 DIAGNOSIS — Z7982 Long term (current) use of aspirin: Secondary | ICD-10-CM | POA: Diagnosis not present

## 2018-10-06 DIAGNOSIS — Z79899 Other long term (current) drug therapy: Secondary | ICD-10-CM

## 2018-10-06 DIAGNOSIS — Z590 Homelessness: Secondary | ICD-10-CM

## 2018-10-06 DIAGNOSIS — D509 Iron deficiency anemia, unspecified: Secondary | ICD-10-CM | POA: Diagnosis present

## 2018-10-06 DIAGNOSIS — J441 Chronic obstructive pulmonary disease with (acute) exacerbation: Secondary | ICD-10-CM | POA: Diagnosis not present

## 2018-10-06 DIAGNOSIS — Z91138 Patient's unintentional underdosing of medication regimen for other reason: Secondary | ICD-10-CM

## 2018-10-06 DIAGNOSIS — R778 Other specified abnormalities of plasma proteins: Secondary | ICD-10-CM

## 2018-10-06 LAB — CBC
HCT: 35.3 % — ABNORMAL LOW (ref 36.0–46.0)
HEMOGLOBIN: 10.1 g/dL — AB (ref 12.0–15.0)
MCH: 23.2 pg — ABNORMAL LOW (ref 26.0–34.0)
MCHC: 28.6 g/dL — AB (ref 30.0–36.0)
MCV: 81.1 fL (ref 80.0–100.0)
Platelets: 321 10*3/uL (ref 150–400)
RBC: 4.35 MIL/uL (ref 3.87–5.11)
RDW: 17.4 % — ABNORMAL HIGH (ref 11.5–15.5)
WBC: 8.3 10*3/uL (ref 4.0–10.5)
nRBC: 0 % (ref 0.0–0.2)

## 2018-10-06 LAB — CBC WITH DIFFERENTIAL/PLATELET
Abs Immature Granulocytes: 0.03 10*3/uL (ref 0.00–0.07)
Basophils Absolute: 0 10*3/uL (ref 0.0–0.1)
Basophils Relative: 0 %
Eosinophils Absolute: 0.1 10*3/uL (ref 0.0–0.5)
Eosinophils Relative: 2 %
HCT: 35 % — ABNORMAL LOW (ref 36.0–46.0)
HEMOGLOBIN: 10 g/dL — AB (ref 12.0–15.0)
Immature Granulocytes: 0 %
LYMPHS PCT: 12 %
Lymphs Abs: 0.9 10*3/uL (ref 0.7–4.0)
MCH: 23.3 pg — ABNORMAL LOW (ref 26.0–34.0)
MCHC: 28.6 g/dL — ABNORMAL LOW (ref 30.0–36.0)
MCV: 81.6 fL (ref 80.0–100.0)
Monocytes Absolute: 0.7 10*3/uL (ref 0.1–1.0)
Monocytes Relative: 9 %
Neutro Abs: 5.3 10*3/uL (ref 1.7–7.7)
Neutrophils Relative %: 77 %
Platelets: 308 10*3/uL (ref 150–400)
RBC: 4.29 MIL/uL (ref 3.87–5.11)
RDW: 17.5 % — ABNORMAL HIGH (ref 11.5–15.5)
WBC: 7 10*3/uL (ref 4.0–10.5)
nRBC: 0 % (ref 0.0–0.2)

## 2018-10-06 LAB — COMPREHENSIVE METABOLIC PANEL
ALT: 40 U/L (ref 0–44)
AST: 36 U/L (ref 15–41)
Albumin: 3.3 g/dL — ABNORMAL LOW (ref 3.5–5.0)
Alkaline Phosphatase: 110 U/L (ref 38–126)
Anion gap: 12 (ref 5–15)
BUN: 13 mg/dL (ref 8–23)
CALCIUM: 8.4 mg/dL — AB (ref 8.9–10.3)
CO2: 21 mmol/L — ABNORMAL LOW (ref 22–32)
Chloride: 106 mmol/L (ref 98–111)
Creatinine, Ser: 0.87 mg/dL (ref 0.44–1.00)
GFR calc Af Amer: >60 mL/min (ref >60)
GFR calc non Af Amer: >60 mL/min (ref >60)
Glucose, Bld: 139 mg/dL — ABNORMAL HIGH (ref 70–99)
Potassium: 3.5 mmol/L (ref 3.5–5.1)
Sodium: 139 mmol/L (ref 135–145)
Total Bilirubin: 0.5 mg/dL (ref 0.3–1.2)
Total Protein: 6 g/dL — ABNORMAL LOW (ref 6.5–8.1)

## 2018-10-06 LAB — MAGNESIUM: Magnesium: 2 mg/dL (ref 1.7–2.4)

## 2018-10-06 LAB — TROPONIN I
Troponin I: 0.14 ng/mL (ref <0.03)
Troponin I: 0.15 ng/mL (ref <0.03)

## 2018-10-06 LAB — BRAIN NATRIURETIC PEPTIDE: B Natriuretic Peptide: 862.4 pg/mL — ABNORMAL HIGH (ref 0.0–100.0)

## 2018-10-06 LAB — CREATININE, SERUM
Creatinine, Ser: 0.88 mg/dL (ref 0.44–1.00)
GFR calc Af Amer: >60 mL/min (ref >60)
GFR calc non Af Amer: >60 mL/min (ref >60)

## 2018-10-06 MED ORDER — FUROSEMIDE 10 MG/ML IJ SOLN
40.0000 mg | Freq: Once | INTRAMUSCULAR | Status: AC
  Administered 2018-10-06: 40 mg via INTRAVENOUS
  Filled 2018-10-06: qty 4

## 2018-10-06 MED ORDER — ACETAMINOPHEN 650 MG RE SUPP: 650.0000 mg | Freq: Four times a day (QID) | RECTAL | Status: DC | PRN

## 2018-10-06 MED ORDER — METOPROLOL TARTRATE 12.5 MG HALF TABLET
12.5000 mg | ORAL_TABLET | Freq: Two times a day (BID) | ORAL | Status: DC
  Administered 2018-10-07 – 2018-10-09 (×4): 12.5 mg via ORAL
  Filled 2018-10-06 (×5): qty 1

## 2018-10-06 MED ORDER — ENOXAPARIN SODIUM 40 MG/0.4ML ~~LOC~~ SOLN
40.0000 mg | SUBCUTANEOUS | Status: DC
  Administered 2018-10-06 – 2018-10-13 (×8): 40 mg via SUBCUTANEOUS
  Filled 2018-10-06 (×8): qty 0.4

## 2018-10-06 MED ORDER — ONDANSETRON HCL 4 MG/2ML IJ SOLN
4.0000 mg | Freq: Four times a day (QID) | INTRAMUSCULAR | Status: DC | PRN
  Filled 2018-10-06: qty 2

## 2018-10-06 MED ORDER — AMITRIPTYLINE HCL 50 MG PO TABS
50.0000 mg | ORAL_TABLET | Freq: Every day | ORAL | Status: DC
  Administered 2018-10-06 – 2018-10-13 (×8): 50 mg via ORAL
  Filled 2018-10-06 (×6): qty 1
  Filled 2018-10-06: qty 2
  Filled 2018-10-06: qty 1

## 2018-10-06 MED ORDER — ONDANSETRON HCL 4 MG PO TABS: 4.0000 mg | ORAL_TABLET | Freq: Four times a day (QID) | ORAL | Status: DC | PRN

## 2018-10-06 MED ORDER — LISINOPRIL 10 MG PO TABS
10.0000 mg | ORAL_TABLET | Freq: Every day | ORAL | Status: DC
  Administered 2018-10-07 – 2018-10-09 (×3): 10 mg via ORAL
  Filled 2018-10-06 (×3): qty 1

## 2018-10-06 MED ORDER — QUETIAPINE FUMARATE 200 MG PO TABS
200.0000 mg | ORAL_TABLET | Freq: Two times a day (BID) | ORAL | Status: DC
  Administered 2018-10-06 – 2018-10-14 (×16): 200 mg via ORAL
  Filled 2018-10-06: qty 1
  Filled 2018-10-06 (×2): qty 8
  Filled 2018-10-06: qty 1
  Filled 2018-10-06 (×13): qty 8

## 2018-10-06 MED ORDER — ACETAMINOPHEN 325 MG PO TABS
650.0000 mg | ORAL_TABLET | Freq: Four times a day (QID) | ORAL | Status: DC | PRN
  Administered 2018-10-06 – 2018-10-10 (×4): 650 mg via ORAL
  Filled 2018-10-06 (×5): qty 2

## 2018-10-06 MED ORDER — FUROSEMIDE 10 MG/ML IJ SOLN
40.0000 mg | Freq: Two times a day (BID) | INTRAMUSCULAR | Status: DC
  Administered 2018-10-07: 40 mg via INTRAVENOUS
  Filled 2018-10-06: qty 4

## 2018-10-06 MED ORDER — SERTRALINE HCL 100 MG PO TABS
100.0000 mg | ORAL_TABLET | Freq: Every day | ORAL | Status: DC
  Administered 2018-10-07 – 2018-10-14 (×8): 100 mg via ORAL
  Filled 2018-10-06 (×8): qty 1

## 2018-10-06 NOTE — H&P (Addendum)
History and Physical    Maria [name] WGN:[id number] DOB: [birth year] DOA: 10/06/2018  PCP: Benito Mccreedy, MD  Patient coming from: Home.  Chief Complaint: Shortness of breath.  HPI: Maria [name] is a 73 y.o. female with history of chronic combined systolic and diastolic CHF last EF measured in July 2019 showed reduced EF and grade 1 diastolic dysfunction with admission in October 2019 for shortness of breath and chest pain at that time patient had underwent Lexiscan which showed low risk study and was attributed to LBBB was discharged home at that time with no further intervention and follow-up with cardiology presents to the ER with complaints of shortness of breath.  Patient states she has been getting short of breath last 1 week at rest and and increased on exertion.  Has been examined nonproductive cough and her husband has been having some pneumonia.  No history is increased swelling in the legs.  Patient states she has lost her medications and has not taken any of them for last 1 week.  Patient chest pain is across the chest mostly on coughing.  Denies fever chills.  ED Course: In the ER chest x-ray shows pleural effusion no definite signs of any infection.  Patient was afebrile.  BNP was 862 troponin was 0.14.  EKG shows normal sinus rhythm with LBBB which is chronic.  WBC count was 7 hemoglobin 10 patient received 40 of Lasix and admitted for acute respiratory failure with hypoxia likely from CHF.  Review of Systems: As per HPI, rest all negative.   Past Medical History:  Diagnosis Date  . Arthritis   . Bipolar disorder ([place])   . COPD (chronic obstructive pulmonary disease) ([place])   . Fracture, ankle 12/25/2017   closed right ankle fracture  . History of kidney stones   . Hypertension     Past Surgical History:  Procedure Laterality Date  . bladder tack    . BREAST LUMPECTOMY    . CARPAL TUNNEL RELEASE Bilateral   . CHOLECYSTECTOMY    . LAMINECTOMY     neck  . ORIF  ANKLE FRACTURE Right 12/26/2017   Procedure: OPEN REDUCTION INTERNAL FIXATION (ORIF) RIGHT BIMALLEOLAR ANKLE FRACTURE;  Surgeon: Marybelle Killings, MD;  Location: [facility];  Service: Orthopedics;  Laterality: Right;  . RADICAL HYSTERECTOMY    . spinal fusion       reports that she has been smoking cigarettes. She has a 28.00 pack-year smoking history. She has never used smokeless tobacco. She reports that she does not drink alcohol or use drugs.  Allergies  Allergen Reactions  . Penicillins Other (See Comments)    Has patient had a PCN reaction causing immediate rash, facial/tongue/throat swelling, SOB or lightheadedness with hypotension: Yes Has patient had a PCN reaction causing severe rash involving mucus membranes or skin necrosis: Yes Has patient had a PCN reaction that required hospitalization Yes Has patient had a PCN reaction occurring within the last 10 years: No If all of the above answers are "NO", then may proceed with Cephalosporin use.     Family History  Problem Relation Age of Onset  . Hypertension Mother   . Heart failure Neg Hx   . CAD Neg Hx     Prior to Admission medications   Medication Sig Start Date End Date Taking? Authorizing Provider  amitriptyline (ELAVIL) 50 MG tablet Take 50 mg by mouth at bedtime. 03/04/18  Yes [provider]  furosemide (LASIX) 20 MG tablet Take 1 tablet (20 mg  total) by mouth daily. 07/11/18  Yes Mikhail, Maryann, DO  lisinopril (PRINIVIL,ZESTRIL) 10 MG tablet Take 10 mg by mouth daily.   Yes [provider]  metoprolol tartrate (LOPRESSOR) 25 MG tablet Take 0.5 tablets (12.5 mg total) by mouth 2 (two) times daily. 07/11/18  Yes Mikhail, Velta Addison, DO  QUEtiapine (SEROQUEL) 200 MG tablet Take 1 tablet (200 mg total) by mouth 2 (two) times daily. 09/04/16  Yes Domenic Moras, PA-C  sertraline (ZOLOFT) 100 MG tablet Take 100 mg by mouth daily.   Yes [provider]    Physical Exam: Vitals:   10/06/18 1612 10/06/18  1745 10/06/18 2018  BP: (!) 107/59 (!) 141/69   Pulse: 99 93   Resp: (!) 23 17 (!) 24  Temp: 99.1 F (37.3 C)    TempSrc: Oral    SpO2: 98% 99%       Constitutional: Moderately built and nourished. Vitals:   10/06/18 1612 10/06/18 1745 10/06/18 2018  BP: (!) 107/59 (!) 141/69   Pulse: 99 93   Resp: (!) 23 17 (!) 24  Temp: 99.1 F (37.3 C)    TempSrc: Oral    SpO2: 98% 99%    Eyes: Anicteric no pallor. ENMT: No discharge from the ears eyes nose or mouth. Neck: No mass felt.  No neck rigidity.  Mild elevated JVD. Respiratory: No rhonchi or crepitations. Cardiovascular: S1-S2 heard. Abdomen: Soft nontender bowel sounds present. Musculoskeletal: No edema. Skin: No rash. Neurologic: Alert awake oriented to time place and person.  Moves all extremities. Psychiatric: Appears normal.  Normal affect.   Labs on Admission: I have personally reviewed following labs and imaging studies  CBC: Recent Labs  Lab 10/06/18 1721  WBC 7.0  NEUTROABS 5.3  HGB 10.0*  HCT 35.0*  MCV 81.6  PLT 350   Basic Metabolic Panel: Recent Labs  Lab 10/06/18 1721  NA 139  K 3.5  CL 106  CO2 21*  GLUCOSE 139*  BUN 13  CREATININE 0.87  CALCIUM 8.4*   GFR: CrCl cannot be calculated (Unknown ideal weight.). Liver Function Tests: Recent Labs  Lab 10/06/18 1721  AST 36  ALT 40  ALKPHOS 110  BILITOT 0.5  PROT 6.0*  ALBUMIN 3.3*   No results for input(s): LIPASE, AMYLASE in the last 168 hours. No results for input(s): AMMONIA in the last 168 hours. Coagulation Profile: No results for input(s): INR, PROTIME in the last 168 hours. Cardiac Enzymes: Recent Labs  Lab 10/06/18 1721  TROPONINI 0.14*   BNP (last 3 results) No results for input(s): PROBNP in the last 8760 hours. HbA1C: No results for input(s): HGBA1C in the last 72 hours. CBG: No results for input(s): GLUCAP in the last 168 hours. Lipid Profile: No results for input(s): CHOL, HDL, LDLCALC, TRIG, CHOLHDL,  LDLDIRECT in the last 72 hours. Thyroid Function Tests: No results for input(s): TSH, T4TOTAL, FREET4, T3FREE, THYROIDAB in the last 72 hours. Anemia Panel: No results for input(s): VITAMINB12, FOLATE, FERRITIN, TIBC, IRON, RETICCTPCT in the last 72 hours. Urine analysis:    Component Value Date/Time   COLORURINE YELLOW 12/25/2017 1048   APPEARANCEUR CLOUDY (A) 12/25/2017 1048   LABSPEC 1.020 12/25/2017 1048   PHURINE 5.0 12/25/2017 1048   GLUCOSEU NEGATIVE 12/25/2017 1048   HGBUR SMALL (A) 12/25/2017 1048   BILIRUBINUR NEGATIVE 12/25/2017 1048   KETONESUR NEGATIVE 12/25/2017 1048   PROTEINUR NEGATIVE 12/25/2017 1048   NITRITE POSITIVE (A) 12/25/2017 1048   LEUKOCYTESUR MODERATE (A) 12/25/2017 1048   Sepsis  Labs: @LABRCNTIP (procalcitonin:4,lacticidven:4) )No results found for this or any previous visit (from the past 240 hour(s)).   Radiological Exams on Admission: Dg Chest 2 View  Result Date: 10/06/2018 CLINICAL DATA:  Dry cough and shortness of breath for the past 10 days. EXAM: CHEST - 2 VIEW COMPARISON:  09/30/2018. FINDINGS: Normal sized heart. Mildly tortuous and calcified thoracic aorta. Clear lungs. The lungs are hyperexpanded with mildly prominent interstitial markings. Normal vascularity. Small bilateral pleural effusions. Calcified granuloma at the posterior right lung base. Upper abdominal surgical clips and lumbar spine fixation hardware. IMPRESSION: 1. Small bilateral pleural effusions. 2. Mild changes of COPD. Electronically Signed   By: Claudie Revering M.D.   On: 10/06/2018 17:18    EKG: Independently reviewed.  Normal sinus rhythm with LBBB.  Assessment/Plan Principal Problem:   Acute respiratory failure with hypoxia (HCC) Active Problems:   Essential hypertension   COPD (chronic obstructive pulmonary disease) (HCC)   Bipolar disorder (HCC)   CKD (chronic kidney disease) stage 3, GFR 30-59 ml/min (HCC)    1. Acute respiratory failure hypoxia likely from CHF  exacerbation.  Last EF measured in July 2019 was showing reduced EF with grade 1 diastolic dysfunction.  Patient also had a Lexiscan Myoview in October 2019 which showed EF of 36%. -Patient has been placed on Lasix 40 mg IV q. 12.  Note that patient has not taken her medications for last 1 week probably contributing to her symptoms.  Closely follow intake output metabolic panel daily weights.  Since patient has been having some cough and her husband has been having some pneumonia we will check procalcitonin.  No definite signs of any pneumonia at this time. 2. Elevated troponin with chest pain and patient has had recent low risk Myoview stress test in October.  Will trend cardiac markers keep patient on aspirin.  Chest pain is mostly pleuritic in nature.  Will check d-dimer. 3. History of COPD not actively wheezing will keep patient on PRN nebulizer. 4. Bipolar disorder will restart her home medication which she has not taken for last 1 week.  Including Seroquel Zoloft and amitriptyline. 5. Hypertension on lisinopril and metoprolol. 6. Chronic anemia will follow CBC.  Check anemia panel.   DVT prophylaxis: Lovenox. Code Status: Full code. Family Communication: Discussed with patient. Disposition Plan: Home. Consults called: None. Admission status: Inpatient.   Rise Patience MD [facility] Pager [phone].  If 7PM-7AM, please contact night-coverage [email or web address] Password [facility]  10/06/2018, 9:40 PM

## 2018-10-06 NOTE — ED Notes (Addendum)
Main lab called about critical troponin of 0.14. MD notified

## 2018-10-06 NOTE — ED Triage Notes (Signed)
Pt arrives via EMS with reports of anxiety and cough. Pt reports SOB after being diagnosed with PNA last week.

## 2018-10-06 NOTE — ED Provider Notes (Signed)
[facility] EMERGENCY DEPARTMENT Provider Note   CSN: [id number] Arrival date & time: 10/06/18  1605     History   Chief Complaint Chief Complaint  Patient presents with  . Cough  . Anxiety    HPI Maria [name] is a 73 y.o. female.  HPI Patient presents with shortness of breath and cough.  Had for the last couple weeks.  Has been seen in the ER and been given steroids and breathing treatments.  States she is had some sputum production.  States she feels short of breath and lightheaded.  States her husband has bilateral pneumonia.  She wonders if it is contagious.  States she has had some fevers.  Has some swelling in her legs.  Some dull chest pain also. Past Medical History:  Diagnosis Date  . Arthritis   . Bipolar disorder ([place])   . COPD (chronic obstructive pulmonary disease) ([place])   . Fracture, ankle 12/25/2017   closed right ankle fracture  . History of kidney stones   . Hypertension     Patient Active Problem List   Diagnosis Date Noted  . Chest pain, rule out acute myocardial infarction 07/11/2018  . Anxiety state   . CHF (congestive heart failure) ([place]) 04/08/2018  . Essential hypertension 04/08/2018  . COPD (chronic obstructive pulmonary disease) ([place]) 04/08/2018  . Bipolar disorder ([place]) 04/08/2018  . CKD (chronic kidney disease) stage 3, GFR 30-59 ml/min (HCC) 04/08/2018  . Tobacco dependence 04/08/2018  . Closed right ankle fracture 12/25/2017    Past Surgical History:  Procedure Laterality Date  . bladder tack    . BREAST LUMPECTOMY    . CARPAL TUNNEL RELEASE Bilateral   . CHOLECYSTECTOMY    . LAMINECTOMY     neck  . ORIF ANKLE FRACTURE Right 12/26/2017   Procedure: OPEN REDUCTION INTERNAL FIXATION (ORIF) RIGHT BIMALLEOLAR ANKLE FRACTURE;  Surgeon: Marybelle Killings, MD;  Location: [facility];  Service: Orthopedics;  Laterality: Right;  . RADICAL HYSTERECTOMY    . spinal fusion       OB History   No obstetric history on file.       Home Medications    Prior to Admission medications   Medication Sig Start Date End Date Taking? Authorizing Provider  amitriptyline (ELAVIL) 50 MG tablet Take 50 mg by mouth at bedtime. 03/04/18  Yes [provider]  furosemide (LASIX) 20 MG tablet Take 1 tablet (20 mg total) by mouth daily. 07/11/18  Yes Mikhail, Maryann, DO  lisinopril (PRINIVIL,ZESTRIL) 10 MG tablet Take 10 mg by mouth daily.   Yes [provider]  metoprolol tartrate (LOPRESSOR) 25 MG tablet Take 0.5 tablets (12.5 mg total) by mouth 2 (two) times daily. 07/11/18  Yes Mikhail, Velta Addison, DO  QUEtiapine (SEROQUEL) 200 MG tablet Take 1 tablet (200 mg total) by mouth 2 (two) times daily. 09/04/16  Yes Domenic Moras, PA-C  sertraline (ZOLOFT) 100 MG tablet Take 100 mg by mouth daily.   Yes [provider]    Family History Family History  Problem Relation Age of Onset  . Hypertension Mother   . Heart failure Neg Hx   . CAD Neg Hx     Social History Social History   Tobacco Use  . Smoking status: Current Every Day Smoker    Packs/day: 0.50    Years: 56.00    Pack years: 28.00    Types: Cigarettes  . Smokeless tobacco: Never Used  . Tobacco comment: down to 3 cigarettes/day  Substance  Use Topics  . Alcohol use: No  . Drug use: No     Allergies   Penicillins   Review of Systems Review of Systems  Constitutional: Positive for appetite change and fever.  HENT: Negative for congestion.   Respiratory: Positive for cough and shortness of breath.   Cardiovascular: Positive for chest pain and leg swelling.  Gastrointestinal: Negative for abdominal pain.  Genitourinary: Negative for enuresis.  Musculoskeletal: Negative for back pain.  Skin: Negative for pallor.  Neurological: Negative for weakness.  Hematological: Negative for adenopathy.  Psychiatric/Behavioral: Negative for confusion.     Physical Exam Updated Vital Signs BP (!) 107/59 (BP Location: Left Arm)   Pulse 99    Temp 99.1 F (37.3 C) (Oral)   Resp (!) 23   SpO2 98%   Physical Exam HENT:     Head: Normocephalic.     Mouth/Throat:     Mouth: Mucous membranes are moist.  Neck:     Musculoskeletal: Neck supple.  Cardiovascular:     Rate and Rhythm: Tachycardia present.  Pulmonary:     Comments: Harsh breath sounds diffusely with prolonged expirations. Abdominal:     Tenderness: There is no abdominal tenderness.  Musculoskeletal:     Right lower leg: Edema present.     Left lower leg: Edema present.  Skin:    Capillary Refill: Capillary refill takes less than 2 seconds.     Coloration: Skin is not jaundiced.     Findings: No bruising.  Neurological:     General: No focal deficit present.     Mental Status: She is alert.      ED Treatments / Results  Labs (all labs ordered are listed, but only abnormal results are displayed) Labs Reviewed  TROPONIN I - Abnormal; Notable for the following components:      Result Value   Troponin I 0.14 (*)    All other components within normal limits  BRAIN NATRIURETIC PEPTIDE - Abnormal; Notable for the following components:   B Natriuretic Peptide 862.4 (*)    All other components within normal limits  COMPREHENSIVE METABOLIC PANEL - Abnormal; Notable for the following components:   CO2 21 (*)    Glucose, Bld 139 (*)    Calcium 8.4 (*)    Total Protein 6.0 (*)    Albumin 3.3 (*)    All other components within normal limits  CBC WITH DIFFERENTIAL/PLATELET - Abnormal; Notable for the following components:   Hemoglobin 10.0 (*)    HCT 35.0 (*)    MCH 23.3 (*)    MCHC 28.6 (*)    RDW 17.5 (*)    All other components within normal limits    EKG EKG Interpretation  Date/Time:  Sunday October 06 2018 16:12:00 EST Ventricular Rate:  99 PR Interval:    QRS Duration: 146 QT Interval:  417 QTC Calculation: 536 R Axis:   -3 Text Interpretation:  Sinus rhythm Probable left atrial enlargement Left bundle branch block Confirmed by  Davonna Belling [phone]) on 10/06/2018 4:53:21 PM   Radiology Dg Chest 2 View  Result Date: 10/06/2018 CLINICAL DATA:  Dry cough and shortness of breath for the past 10 days. EXAM: CHEST - 2 VIEW COMPARISON:  09/30/2018. FINDINGS: Normal sized heart. Mildly tortuous and calcified thoracic aorta. Clear lungs. The lungs are hyperexpanded with mildly prominent interstitial markings. Normal vascularity. Small bilateral pleural effusions. Calcified granuloma at the posterior right lung base. Upper abdominal surgical clips and lumbar spine fixation hardware. IMPRESSION: 1.  Small bilateral pleural effusions. 2. Mild changes of COPD. Electronically Signed   By: Claudie Revering M.D.   On: 10/06/2018 17:18    Procedures Procedures (including critical care time)  Medications Ordered in ED Medications - No data to display   Initial Impression / Assessment and Plan / ED Course  I have reviewed the triage vital signs and the nursing notes.  Pertinent labs & imaging results that were available during my care of the patient were reviewed by me and considered in my medical decision making (see chart for details).    Patient presents with coughing shortness of breath.  States her husband has pneumonia.  States she has been bringing up some sputum.  Has had some swelling in her legs.  States she feels worn out is been having chills.  Dull chest pain also.  EKG is stable with a left bundle branch block.  X-ray shows some effusions without frank infiltrate or CHF.  BNP is elevated as is her troponin.  I think her troponin is likely secondary to demand with her shortness of breath and may be some CHF.  Reviewed records and had a stress test recently that was intermediate risk.  Thought to be the artifact due to left bundle branch block.  Will admit to hospitalist however.  Final Clinical Impressions(s) / ED Diagnoses   Final diagnoses:  Upper respiratory tract infection, unspecified type  Congestive heart  failure, unspecified HF chronicity, unspecified heart failure type [facility])  Elevated troponin    ED Discharge Orders    None       Davonna Belling, MD 10/06/18 1946

## 2018-10-07 ENCOUNTER — Other Ambulatory Visit: Payer: Self-pay

## 2018-10-07 DIAGNOSIS — I5023 Acute on chronic systolic (congestive) heart failure: Secondary | ICD-10-CM

## 2018-10-07 DIAGNOSIS — J9601 Acute respiratory failure with hypoxia: Secondary | ICD-10-CM

## 2018-10-07 DIAGNOSIS — I420 Dilated cardiomyopathy: Secondary | ICD-10-CM

## 2018-10-07 DIAGNOSIS — I10 Essential (primary) hypertension: Secondary | ICD-10-CM

## 2018-10-07 DIAGNOSIS — R0602 Shortness of breath: Secondary | ICD-10-CM

## 2018-10-07 DIAGNOSIS — R7989 Other specified abnormal findings of blood chemistry: Secondary | ICD-10-CM

## 2018-10-07 LAB — BASIC METABOLIC PANEL
Anion gap: 9 (ref 5–15)
BUN: 11 mg/dL (ref 8–23)
CO2: 25 mmol/L (ref 22–32)
Calcium: 8.1 mg/dL — ABNORMAL LOW (ref 8.9–10.3)
Chloride: 106 mmol/L (ref 98–111)
Creatinine, Ser: 0.83 mg/dL (ref 0.44–1.00)
GFR calc Af Amer: >60 mL/min (ref >60)
GFR calc non Af Amer: >60 mL/min (ref >60)
Glucose, Bld: 113 mg/dL — ABNORMAL HIGH (ref 70–99)
Potassium: 3.4 mmol/L — ABNORMAL LOW (ref 3.5–5.1)
SODIUM: 140 mmol/L (ref 135–145)

## 2018-10-07 LAB — TYPE AND SCREEN
ABO/RH(D): O POS
Antibody Screen: NEGATIVE

## 2018-10-07 LAB — CBC
HCT: 30.4 % — ABNORMAL LOW (ref 36.0–46.0)
Hemoglobin: 8.8 g/dL — ABNORMAL LOW (ref 12.0–15.0)
MCH: 23 pg — ABNORMAL LOW (ref 26.0–34.0)
MCHC: 28.9 g/dL — ABNORMAL LOW (ref 30.0–36.0)
MCV: 79.6 fL — ABNORMAL LOW (ref 80.0–100.0)
Platelets: 277 10*3/uL (ref 150–400)
RBC: 3.82 MIL/uL — ABNORMAL LOW (ref 3.87–5.11)
RDW: 17.2 % — AB (ref 11.5–15.5)
WBC: 5.6 10*3/uL (ref 4.0–10.5)
nRBC: 0 % (ref 0.0–0.2)

## 2018-10-07 LAB — TROPONIN I: Troponin I: 0.11 ng/mL (ref <0.03)

## 2018-10-07 LAB — D-DIMER, QUANTITATIVE: D-Dimer, Quant: 0.54 ug/mL-FEU — ABNORMAL HIGH (ref 0.00–0.50)

## 2018-10-07 LAB — IRON AND TIBC
Iron: 14 ug/dL — ABNORMAL LOW (ref 28–170)
SATURATION RATIOS: 5 % — AB (ref 10.4–31.8)
TIBC: 298 ug/dL (ref 250–450)
UIBC: 284 ug/dL

## 2018-10-07 LAB — FERRITIN: Ferritin: 11 ng/mL (ref 11–307)

## 2018-10-07 LAB — RETICULOCYTES
Immature Retic Fract: 15.6 % (ref 2.3–15.9)
RBC.: 3.77 MIL/uL — ABNORMAL LOW (ref 3.87–5.11)
Retic Count, Absolute: 52.8 10*3/uL (ref 19.0–186.0)
Retic Ct Pct: 1.4 % (ref 0.4–3.1)

## 2018-10-07 LAB — VITAMIN B12: Vitamin B-12: 362 pg/mL (ref 180–914)

## 2018-10-07 LAB — ABO/RH: ABO/RH(D): O POS

## 2018-10-07 LAB — PROCALCITONIN: Procalcitonin: <0.1 ng/mL

## 2018-10-07 LAB — FOLATE: Folate: 13.7 ng/mL (ref >5.9)

## 2018-10-07 MED ORDER — POTASSIUM CHLORIDE CRYS ER 20 MEQ PO TBCR
40.0000 meq | EXTENDED_RELEASE_TABLET | Freq: Two times a day (BID) | ORAL | Status: DC
  Administered 2018-10-07: 40 meq via ORAL
  Filled 2018-10-07 (×2): qty 2

## 2018-10-07 MED ORDER — ALPRAZOLAM 0.25 MG PO TABS
0.2500 mg | ORAL_TABLET | Freq: Once | ORAL | Status: AC
  Administered 2018-10-07: 0.25 mg via ORAL
  Filled 2018-10-07: qty 1

## 2018-10-07 MED ORDER — POTASSIUM CHLORIDE CRYS ER 20 MEQ PO TBCR
40.0000 meq | EXTENDED_RELEASE_TABLET | Freq: Two times a day (BID) | ORAL | Status: DC
  Administered 2018-10-07: 40 meq via ORAL

## 2018-10-07 MED ORDER — FUROSEMIDE 10 MG/ML IJ SOLN
40.0000 mg | Freq: Two times a day (BID) | INTRAMUSCULAR | Status: AC
  Administered 2018-10-07 – 2018-10-08 (×2): 40 mg via INTRAVENOUS
  Filled 2018-10-07 (×3): qty 4

## 2018-10-07 MED ORDER — LEVALBUTEROL HCL 0.63 MG/3ML IN NEBU
0.6300 mg | INHALATION_SOLUTION | Freq: Four times a day (QID) | RESPIRATORY_TRACT | Status: DC | PRN
  Administered 2018-10-08: 0.63 mg via RESPIRATORY_TRACT
  Filled 2018-10-07: qty 3

## 2018-10-07 MED ORDER — ALPRAZOLAM 0.25 MG PO TABS
0.2500 mg | ORAL_TABLET | Freq: Two times a day (BID) | ORAL | Status: DC | PRN
  Administered 2018-10-07 – 2018-10-08 (×2): 0.25 mg via ORAL
  Filled 2018-10-07 (×3): qty 1

## 2018-10-07 MED ORDER — ASPIRIN EC 81 MG PO TBEC
81.0000 mg | DELAYED_RELEASE_TABLET | Freq: Every day | ORAL | Status: DC
  Administered 2018-10-07 – 2018-10-14 (×8): 81 mg via ORAL
  Filled 2018-10-07 (×8): qty 1

## 2018-10-07 MED ORDER — ALPRAZOLAM 0.5 MG PO TABS
0.5000 mg | ORAL_TABLET | Freq: Once | ORAL | Status: AC
  Administered 2018-10-07: 0.5 mg via ORAL
  Filled 2018-10-07: qty 1

## 2018-10-07 NOTE — Progress Notes (Signed)
PROGRESS NOTE    Maria [name]  ERX:[id number] DOB: 1946-06-25 DOA: 10/06/2018 PCP: Benito Mccreedy, MD  Brief Narrative: 73 year old female smoker with history of chronic systolic and diastolic CHF, last Myoview showed EF of 35%, chronic left bundle branch block, COPD, presented to the emergency room with shortness of breath progressive over 1 week, poor compliance with medications.  Chest pressure/heaviness with activity for 5 to 6 months.  Upon evaluation in the ED last night chest x-ray showed bilateral pleural effusions, BNP of 862, EKG with chronic left bundle branch block   Assessment & Plan:   Principal Problem:   Acute respiratory failure with hypoxia (HCC) -Due to acute systolic CHF, pleural effusions noted on x-ray -Continue IV Lasix today -last Echo reported depressed EF, she had a Myoview in October which showed no reversible ischemia, medium-sized defect in anteroseptal, mid inferoseptal apical locations, EF 36%, no further ischemia evaluation was pursued -Does have history suggestive of angina, ongoing for 6 months -Given heavy smoking, HTN, age and family history definitely has risk factors for underlying CAD, will ask Cards to evaluate -Troponin is minimally elevated with flat trend, do not suspect ACS, this is secondary to demand  Acute systolic CHF -As above  Chronic left bundle branch block  COPD/ongoing tobacco abuse -50+ pack year smoker, counseled -No wheezing at this time, continue duo nebs optimize medications at discharge   Chronic microcytic anemia -With slight worsening, check anemia panel -No overt bleeding reported  Anxiety/depression -Continue zoloft and Seroquel  DVT prophylaxis: Lovenox Code Status: Full code Family Communication: No family at bedside Disposition Plan: Home pending improvement in respiratory status  Consultants:   Cards   Procedures:   Antimicrobials:    Subjective: -Breathing improving, was urinating most of the  night -Reports chronic morning cough, no wheezing  Objective: Vitals:   10/07/18 0024 10/07/18 0044 10/07/18 0532 10/07/18 0721  BP: (!) 101/39   (!) 150/70  Pulse: 100   (!) 115  Resp: 16   14  Temp: 98.6 F (37 C)   97.6 F (36.4 C)  TempSrc: Oral     SpO2: 91%  94% 92%  Weight:  74.1 kg    Height:  5\' 7"  (1.702 m)      Intake/Output Summary (Last 24 hours) at 10/07/2018 1016 Last data filed at 10/07/2018 0400 Gross per 24 hour  Intake 240 ml  Output 700 ml  Net -460 ml   Filed Weights   10/07/18 0044  Weight: 74.1 kg    Examination:  General exam: Appears calm and comfortable, no distress Respiratory system: Poor air movement bilaterally, decreased breath sounds at both bases Cardiovascular system: S1 & S2 heard, RRR.  Gastrointestinal system: Abdomen is nondistended, soft and nontender.Normal bowel sounds heard. Central nervous system: Alert and oriented. No focal neurological deficits. Extremities: Trace edema Skin: No rashes, lesions or ulcers Psychiatry: Judgement and insight appear normal. Mood & affect appropriate.     Data Reviewed:   CBC: Recent Labs  Lab 10/06/18 1721 10/06/18 2217 10/07/18 0318  WBC 7.0 8.3 5.6  NEUTROABS 5.3  --   --   HGB 10.0* 10.1* 8.8*  HCT 35.0* 35.3* 30.4*  MCV 81.6 81.1 79.6*  PLT 308 321 950   Basic Metabolic Panel: Recent Labs  Lab 10/06/18 1721 10/06/18 2217 10/07/18 0318  NA 139  --  140  K 3.5  --  3.4*  CL 106  --  106  CO2 21*  --  25  GLUCOSE 139*  --  113*  BUN 13  --  11  CREATININE 0.87 0.88 0.83  CALCIUM 8.4*  --  8.1*  MG  --  2.0  --    GFR: Estimated Creatinine Clearance: 65.4 mL/min (by C-G formula based on SCr of 0.83 mg/dL). Liver Function Tests: Recent Labs  Lab 10/06/18 1721  AST 36  ALT 40  ALKPHOS 110  BILITOT 0.5  PROT 6.0*  ALBUMIN 3.3*   No results for input(s): LIPASE, AMYLASE in the last 168 hours. No results for input(s): AMMONIA in the last 168 hours. Coagulation  Profile: No results for input(s): INR, PROTIME in the last 168 hours. Cardiac Enzymes: Recent Labs  Lab 10/06/18 1721 10/06/18 2217 10/07/18 0318  TROPONINI 0.14* 0.15* 0.11*   BNP (last 3 results) No results for input(s): PROBNP in the last 8760 hours. HbA1C: No results for input(s): HGBA1C in the last 72 hours. CBG: No results for input(s): GLUCAP in the last 168 hours. Lipid Profile: No results for input(s): CHOL, HDL, LDLCALC, TRIG, CHOLHDL, LDLDIRECT in the last 72 hours. Thyroid Function Tests: No results for input(s): TSH, T4TOTAL, FREET4, T3FREE, THYROIDAB in the last 72 hours. Anemia Panel: No results for input(s): VITAMINB12, FOLATE, FERRITIN, TIBC, IRON, RETICCTPCT in the last 72 hours. Urine analysis:    Component Value Date/Time   COLORURINE YELLOW 12/25/2017 1048   APPEARANCEUR CLOUDY (A) 12/25/2017 1048   LABSPEC 1.020 12/25/2017 1048   PHURINE 5.0 12/25/2017 1048   GLUCOSEU NEGATIVE 12/25/2017 1048   HGBUR SMALL (A) 12/25/2017 1048   BILIRUBINUR NEGATIVE 12/25/2017 1048   KETONESUR NEGATIVE 12/25/2017 1048   PROTEINUR NEGATIVE 12/25/2017 1048   NITRITE POSITIVE (A) 12/25/2017 1048   LEUKOCYTESUR MODERATE (A) 12/25/2017 1048   Sepsis Labs: @LABRCNTIP (procalcitonin:4,lacticidven:4)  )No results found for this or any previous visit (from the past 240 hour(s)).       Radiology Studies: Dg Chest 2 View  Result Date: 10/06/2018 CLINICAL DATA:  Dry cough and shortness of breath for the past 10 days. EXAM: CHEST - 2 VIEW COMPARISON:  09/30/2018. FINDINGS: Normal sized heart. Mildly tortuous and calcified thoracic aorta. Clear lungs. The lungs are hyperexpanded with mildly prominent interstitial markings. Normal vascularity. Small bilateral pleural effusions. Calcified granuloma at the posterior right lung base. Upper abdominal surgical clips and lumbar spine fixation hardware. IMPRESSION: 1. Small bilateral pleural effusions. 2. Mild changes of COPD.  Electronically Signed   By: Claudie Revering M.D.   On: 10/06/2018 17:18        Scheduled Meds: . amitriptyline  50 mg Oral QHS  . aspirin EC  81 mg Oral Daily  . enoxaparin (LOVENOX) injection  40 mg Subcutaneous Q24H  . furosemide  40 mg Intravenous Q12H  . lisinopril  10 mg Oral Daily  . metoprolol tartrate  12.5 mg Oral BID  . potassium chloride  40 mEq Oral BID  . QUEtiapine  200 mg Oral BID  . sertraline  100 mg Oral Daily   Continuous Infusions:   LOS: 1 day    Time spent: 69min    Domenic Polite, MD [facility] Page via [email or web address], password [email or web address] After 7PM please contact night-coverage  10/07/2018, 10:16 AM

## 2018-10-07 NOTE — Progress Notes (Signed)
Pt states she having s.o.b.. Pt would like RT to assess and provide tx. MD paged.
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
## 2018-10-08 DIAGNOSIS — N183 Chronic kidney disease, stage 3 (moderate): Secondary | ICD-10-CM

## 2018-10-08 DIAGNOSIS — I472 Ventricular tachycardia: Secondary | ICD-10-CM

## 2018-10-08 LAB — BASIC METABOLIC PANEL
Anion gap: 8 (ref 5–15)
BUN: 16 mg/dL (ref 8–23)
CO2: 27 mmol/L (ref 22–32)
Calcium: 8.4 mg/dL — ABNORMAL LOW (ref 8.9–10.3)
Chloride: 104 mmol/L (ref 98–111)
Creatinine, Ser: 1 mg/dL (ref 0.44–1.00)
GFR calc Af Amer: >60 mL/min (ref >60)
GFR calc non Af Amer: 57 mL/min — ABNORMAL LOW (ref >60)
Glucose, Bld: 105 mg/dL — ABNORMAL HIGH (ref 70–99)
POTASSIUM: 4.5 mmol/L (ref 3.5–5.1)
Sodium: 139 mmol/L (ref 135–145)

## 2018-10-08 LAB — LIPID PANEL
Cholesterol: 131 mg/dL (ref 0–200)
HDL: 37 mg/dL — ABNORMAL LOW (ref >40)
LDL Cholesterol: 83 mg/dL (ref 0–99)
TRIGLYCERIDES: 55 mg/dL (ref <150)
Total CHOL/HDL Ratio: 3.5 RATIO
VLDL: 11 mg/dL (ref 0–40)

## 2018-10-08 LAB — CBC
HCT: 32.8 % — ABNORMAL LOW (ref 36.0–46.0)
Hemoglobin: 9.5 g/dL — ABNORMAL LOW (ref 12.0–15.0)
MCH: 23.1 pg — ABNORMAL LOW (ref 26.0–34.0)
MCHC: 29 g/dL — ABNORMAL LOW (ref 30.0–36.0)
MCV: 79.6 fL — ABNORMAL LOW (ref 80.0–100.0)
Platelets: 255 10*3/uL (ref 150–400)
RBC: 4.12 MIL/uL (ref 3.87–5.11)
RDW: 17.2 % — ABNORMAL HIGH (ref 11.5–15.5)
WBC: 6.9 10*3/uL (ref 4.0–10.5)
nRBC: 0 % (ref 0.0–0.2)

## 2018-10-08 MED ORDER — FUROSEMIDE 10 MG/ML IJ SOLN
40.0000 mg | Freq: Once | INTRAMUSCULAR | Status: AC
  Administered 2018-10-08: 40 mg via INTRAVENOUS
  Filled 2018-10-08: qty 4

## 2018-10-08 MED ORDER — IPRATROPIUM-ALBUTEROL 0.5-2.5 (3) MG/3ML IN SOLN
RESPIRATORY_TRACT | Status: AC
  Filled 2018-10-08: qty 3

## 2018-10-08 MED ORDER — IPRATROPIUM-ALBUTEROL 0.5-2.5 (3) MG/3ML IN SOLN
3.0000 mL | Freq: Four times a day (QID) | RESPIRATORY_TRACT | Status: DC
  Administered 2018-10-08 – 2018-10-09 (×6): 3 mL via RESPIRATORY_TRACT
  Filled 2018-10-08 (×6): qty 3

## 2018-10-08 MED ORDER — FUROSEMIDE 40 MG PO TABS
40.0000 mg | ORAL_TABLET | Freq: Two times a day (BID) | ORAL | Status: DC
  Administered 2018-10-09: 40 mg via ORAL
  Filled 2018-10-08 (×2): qty 1

## 2018-10-08 MED ORDER — ALBUTEROL (5 MG/ML) CONTINUOUS INHALATION SOLN
5.0000 mg/h | INHALATION_SOLUTION | RESPIRATORY_TRACT | Status: DC
  Filled 2018-10-08: qty 20

## 2018-10-08 MED ORDER — METHYLPREDNISOLONE SODIUM SUCC 125 MG IJ SOLR
60.0000 mg | Freq: Two times a day (BID) | INTRAMUSCULAR | Status: DC
  Administered 2018-10-08 – 2018-10-09 (×3): 60 mg via INTRAVENOUS
  Filled 2018-10-08 (×3): qty 2

## 2018-10-08 MED ORDER — SODIUM CHLORIDE 0.9 % IV SOLN
510.0000 mg | Freq: Once | INTRAVENOUS | Status: AC
  Administered 2018-10-08: 510 mg via INTRAVENOUS
  Filled 2018-10-08: qty 17

## 2018-10-08 NOTE — Progress Notes (Signed)
PROGRESS NOTE    Maria [name]  ZDG:[id number] DOB: [birth year] DOA: 10/06/2018 PCP: Benito Mccreedy, MD  Brief Narrative: 73 year old female smoker with history of chronic systolic and diastolic CHF, last Myoview showed EF of 35%, chronic left bundle branch block, COPD, presented to the emergency room with shortness of breath progressive over 1 week, poor compliance with medications.  Chest pressure/heaviness with activity for 5 to 6 months.  Upon evaluation in the ED last night chest x-ray showed bilateral pleural effusions, BNP of 862, EKG with chronic left bundle branch block. -improving with diuresis, also on prednisone and nebs for bronchitis  Assessment & Plan:   Principal Problem:   Acute respiratory failure with hypoxia (HCC) -Due to acute systolic CHF, pleural effusions noted on x-ray -Diuresed with IV Lasix, she is over 2 L negative appears close to euvolemic, change to oral Lasix -Last echo with depressed EF, Myoview in 10/201 9 showed no overt reversible ischemia, medium-sized defects in multiple territories, EF 36% -She is considered a poor candidate for left heart catheterization due to psychiatric illnesses, homelessness/social issues and access to healthcare -Appreciate cardiology input, continue metoprolol and lisinopril  COPD exacerbation -50+ pack year smoker -Add IV steroids, continue duo nebs -Counseled  Elevated troponin -Secondary to demand ischemia  -Poor candidate for left heart catheterization as outlined above  Acute systolic CHF -As above  Chronic left bundle branch block   Chronic microcytic anemia -No overt bleeding, anemia panel with severe iron deficiency -We will give IV iron and start oral iron at discharge -Needs a screening colonoscopy  Anxiety/depression -Continue zoloft and Seroquel  DVT prophylaxis: Lovenox Code Status: Full code Family Communication: No family at bedside Disposition Plan: Home pending improvement in respiratory  status  Consultants:   Cards   Procedures:   Antimicrobials:    Subjective: -History of cough congestion, breathing is slowly improving, anxiety affecting her sleep and respiratory status  Objective: Vitals:   10/08/18 0610 10/08/18 0739 10/08/18 0900 10/08/18 0920  BP:  (!) 94/56    Pulse:  97 97 97  Resp:      Temp:  98.8 F (37.1 C)    TempSrc:      SpO2: 96% 97%  93%  Weight:      Height:        Intake/Output Summary (Last 24 hours) at 10/08/2018 1301 Last data filed at 10/08/2018 0551 Gross per 24 hour  Intake 300 ml  Output 2000 ml  Net -1700 ml   Filed Weights   10/07/18 0044 10/08/18 0500  Weight: 74.1 kg 68.8 kg    Examination:  Gen: Currently ill cachectic female, sitting up in bed, uncomfortable, AAO x3 HEENT: PERRLA, Neck supple, no JVD Lungs: Poor air movement, few scattered rhonchi CVS: RRR,No Gallops,Rubs or new Murmurs Abd: soft, Non tender, non distended, BS present Extremities: No edema Skin: no new rashes Psychiatry: Strongly anxious    Data Reviewed:   CBC: Recent Labs  Lab 10/06/18 1721 10/06/18 2217 10/07/18 0318 10/08/18 0215  WBC 7.0 8.3 5.6 6.9  NEUTROABS 5.3  --   --   --   HGB 10.0* 10.1* 8.8* 9.5*  HCT 35.0* 35.3* 30.4* 32.8*  MCV 81.6 81.1 79.6* 79.6*  PLT 308 321 277 595   Basic Metabolic Panel: Recent Labs  Lab 10/06/18 1721 10/06/18 2217 10/07/18 0318 10/08/18 0215  NA 139  --  140 139  K 3.5  --  3.4* 4.5  CL 106  --  106 104  CO2  21*  --  25 27  GLUCOSE 139*  --  113* 105*  BUN 13  --  11 16  CREATININE 0.87 0.88 0.83 1.00  CALCIUM 8.4*  --  8.1* 8.4*  MG  --  2.0  --   --    GFR: Estimated Creatinine Clearance: 50.2 mL/min (by C-G formula based on SCr of 1 mg/dL). Liver Function Tests: Recent Labs  Lab 10/06/18 1721  AST 36  ALT 40  ALKPHOS 110  BILITOT 0.5  PROT 6.0*  ALBUMIN 3.3*   No results for input(s): LIPASE, AMYLASE in the last 168 hours. No results for input(s): AMMONIA in  the last 168 hours. Coagulation Profile: No results for input(s): INR, PROTIME in the last 168 hours. Cardiac Enzymes: Recent Labs  Lab 10/06/18 1721 10/06/18 2217 10/07/18 0318  TROPONINI 0.14* 0.15* 0.11*   BNP (last 3 results) No results for input(s): PROBNP in the last 8760 hours. HbA1C: No results for input(s): HGBA1C in the last 72 hours. CBG: No results for input(s): GLUCAP in the last 168 hours. Lipid Profile: Recent Labs    10/08/18 0215  CHOL 131  HDL 37*  LDLCALC 83  TRIG 55  CHOLHDL 3.5   Thyroid Function Tests: No results for input(s): TSH, T4TOTAL, FREET4, T3FREE, THYROIDAB in the last 72 hours. Anemia Panel: Recent Labs    10/07/18 0915  VITAMINB12 362  FOLATE 13.7  FERRITIN 11  TIBC 298  IRON 14*  RETICCTPCT 1.4   Urine analysis:    Component Value Date/Time   COLORURINE YELLOW 12/25/2017 1048   APPEARANCEUR CLOUDY (A) 12/25/2017 1048   LABSPEC 1.020 12/25/2017 1048   PHURINE 5.0 12/25/2017 1048   GLUCOSEU NEGATIVE 12/25/2017 1048   HGBUR SMALL (A) 12/25/2017 1048   BILIRUBINUR NEGATIVE 12/25/2017 1048   KETONESUR NEGATIVE 12/25/2017 1048   PROTEINUR NEGATIVE 12/25/2017 1048   NITRITE POSITIVE (A) 12/25/2017 1048   LEUKOCYTESUR MODERATE (A) 12/25/2017 1048   Sepsis Labs: @LABRCNTIP (procalcitonin:4,lacticidven:4)  )No results found for this or any previous visit (from the past 240 hour(s)).       Radiology Studies: Dg Chest 2 View  Result Date: 10/06/2018 CLINICAL DATA:  Dry cough and shortness of breath for the past 10 days. EXAM: CHEST - 2 VIEW COMPARISON:  09/30/2018. FINDINGS: Normal sized heart. Mildly tortuous and calcified thoracic aorta. Clear lungs. The lungs are hyperexpanded with mildly prominent interstitial markings. Normal vascularity. Small bilateral pleural effusions. Calcified granuloma at the posterior right lung base. Upper abdominal surgical clips and lumbar spine fixation hardware. IMPRESSION: 1. Small bilateral  pleural effusions. 2. Mild changes of COPD. Electronically Signed   By: Claudie Revering M.D.   On: 10/06/2018 17:18        Scheduled Meds: . amitriptyline  50 mg Oral QHS  . aspirin EC  81 mg Oral Daily  . enoxaparin (LOVENOX) injection  40 mg Subcutaneous Q24H  . furosemide  40 mg Oral BID  . ipratropium-albuterol  3 mL Nebulization Q6H  . ipratropium-albuterol      . lisinopril  10 mg Oral Daily  . methylPREDNISolone (SOLU-MEDROL) injection  60 mg Intravenous Q12H  . metoprolol tartrate  12.5 mg Oral BID  . QUEtiapine  200 mg Oral BID  . sertraline  100 mg Oral Daily   Continuous Infusions:   LOS: 2 days    Time spent: 66min    Domenic Polite, MD [facility]  10/08/2018, 1:01 PM

## 2018-10-08 NOTE — Care Management Note (Addendum)
Case Management Note  Patient Details  Name: Maria [name] MRN: [id number] Date of Birth: Dec 04, 1945  Subjective/Objective:    Patient states she is staying at the [employer], she states her insurance covers her medications and she states Ose- Bonsu is her PCP and she usually takes a bus to go to apt.  She does not have transportation, she states she would like to go to [facility] for PCP, NCM will schedule a follow up apt for her at one of the [facility] clinics.  She will also need a couple of bus passes at dc.  Will have CSW to come speak with patient also about social issues.   1/31 Tomi Bamberger RN, BSN- patient may need home oxygen at dc.  NCM spoke with Jermaine with [facility] , informed him that patient will be going to shelter at discharge, he states he can get her oxygen with a portable concentrator.  He states Leroy Sea will be working this weekend and he left [place] a note regarding this. NCM informed Network engineer.               Action/Plan: NCM will cont to follow for transition of care needs.   Expected Discharge Date:                  Expected Discharge Plan:  Homeless Shelter  In-House Referral:  Clinical Social Work  Discharge planning Services  CM Consult  Post Acute Care Choice:    Choice offered to:     DME Arranged:    DME Agency:     HH Arranged:    HH Agency:     Status of Service:  In process, will continue to follow  If discussed at Long Length of Stay Meetings, dates discussed:    Additional Comments:  Zenon Mayo, RN 10/08/2018, 1:04 PM
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
## 2018-10-09 LAB — CBC
HCT: 35.5 % — ABNORMAL LOW (ref 36.0–46.0)
Hemoglobin: 10.4 g/dL — ABNORMAL LOW (ref 12.0–15.0)
MCH: 23.1 pg — ABNORMAL LOW (ref 26.0–34.0)
MCHC: 29.3 g/dL — ABNORMAL LOW (ref 30.0–36.0)
MCV: 78.9 fL — ABNORMAL LOW (ref 80.0–100.0)
Platelets: 249 10*3/uL (ref 150–400)
RBC: 4.5 MIL/uL (ref 3.87–5.11)
RDW: 17 % — ABNORMAL HIGH (ref 11.5–15.5)
WBC: 11.7 10*3/uL — ABNORMAL HIGH (ref 4.0–10.5)
nRBC: 0 % (ref 0.0–0.2)

## 2018-10-09 LAB — BASIC METABOLIC PANEL
Anion gap: 11 (ref 5–15)
BUN: 33 mg/dL — AB (ref 8–23)
CO2: 28 mmol/L (ref 22–32)
CREATININE: 1.15 mg/dL — AB (ref 0.44–1.00)
Calcium: 8.7 mg/dL — ABNORMAL LOW (ref 8.9–10.3)
Chloride: 98 mmol/L (ref 98–111)
GFR calc Af Amer: 55 mL/min — ABNORMAL LOW (ref >60)
GFR calc non Af Amer: 48 mL/min — ABNORMAL LOW (ref >60)
Glucose, Bld: 167 mg/dL — ABNORMAL HIGH (ref 70–99)
POTASSIUM: 4.4 mmol/L (ref 3.5–5.1)
Sodium: 137 mmol/L (ref 135–145)

## 2018-10-09 MED ORDER — ATORVASTATIN CALCIUM 10 MG PO TABS
20.0000 mg | ORAL_TABLET | Freq: Every day | ORAL | Status: DC
  Administered 2018-10-09 – 2018-10-13 (×5): 20 mg via ORAL
  Filled 2018-10-09 (×5): qty 2

## 2018-10-09 MED ORDER — LISINOPRIL 5 MG PO TABS
5.0000 mg | ORAL_TABLET | Freq: Every day | ORAL | Status: DC
  Administered 2018-10-10 – 2018-10-14 (×5): 5 mg via ORAL
  Filled 2018-10-09 (×5): qty 1

## 2018-10-09 MED ORDER — FUROSEMIDE 40 MG PO TABS
40.0000 mg | ORAL_TABLET | Freq: Every day | ORAL | Status: DC
  Administered 2018-10-10 – 2018-10-14 (×5): 40 mg via ORAL
  Filled 2018-10-09 (×5): qty 1

## 2018-10-09 MED ORDER — METHYLPREDNISOLONE SODIUM SUCC 125 MG IJ SOLR
60.0000 mg | Freq: Two times a day (BID) | INTRAMUSCULAR | Status: AC
  Administered 2018-10-09: 60 mg via INTRAVENOUS
  Filled 2018-10-09: qty 2

## 2018-10-09 MED ORDER — IPRATROPIUM-ALBUTEROL 0.5-2.5 (3) MG/3ML IN SOLN
3.0000 mL | Freq: Three times a day (TID) | RESPIRATORY_TRACT | Status: DC
  Administered 2018-10-10 – 2018-10-13 (×12): 3 mL via RESPIRATORY_TRACT
  Filled 2018-10-09 (×11): qty 3

## 2018-10-09 MED ORDER — PHENOL 1.4 % MT LIQD
1.0000 | OROMUCOSAL | Status: DC | PRN
  Administered 2018-10-09: 1 via OROMUCOSAL
  Filled 2018-10-09: qty 177

## 2018-10-09 MED ORDER — ISOSORBIDE MONONITRATE ER 30 MG PO TB24
15.0000 mg | ORAL_TABLET | Freq: Every day | ORAL | Status: DC
  Administered 2018-10-09 – 2018-10-14 (×6): 15 mg via ORAL
  Filled 2018-10-09 (×6): qty 1

## 2018-10-09 MED ORDER — PREDNISONE 20 MG PO TABS
40.0000 mg | ORAL_TABLET | Freq: Every day | ORAL | Status: DC
  Administered 2018-10-10 – 2018-10-14 (×5): 40 mg via ORAL
  Filled 2018-10-09 (×5): qty 2

## 2018-10-09 MED ORDER — METOPROLOL SUCCINATE ER 25 MG PO TB24
25.0000 mg | ORAL_TABLET | Freq: Every day | ORAL | Status: DC
  Administered 2018-10-09 – 2018-10-14 (×6): 25 mg via ORAL
  Filled 2018-10-09 (×6): qty 1

## 2018-10-09 NOTE — Care Management Important Message (Signed)
Important Message  Patient Details  Name: Maria [name] MRN: [id number] Date of Birth: 1946/03/25   Medicare Important Message Given:  Yes    [place] [address]vil [address] 10/09/2018, 4:10 PM
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
## 2018-10-09 NOTE — Progress Notes (Addendum)
PROGRESS NOTE    Maria [name]  UYQ:[id number] DOB: 03-10-1946 DOA: 10/06/2018 PCP: [name] Mccreedy, MD  Brief Narrative: 73 year old female smoker with history of chronic systolic and diastolic CHF, last Myoview showed EF of 35%, chronic left bundle branch block, COPD, presented to the emergency room with shortness of breath progressive over 1 week, poor compliance with medications.  Chest pressure/heaviness with activity for 5 to 6 months.  Upon evaluation in the ED last night chest x-ray showed bilateral pleural effusions, BNP of 862, EKG with chronic left bundle branch block. -improving with diuresis, also on prednisone and nebs for bronchitis  Assessment & Plan:   Acute respiratory failure with hypoxia (HCC) -Due to acute systolic CHF, pleural effusions noted on x-ray -Diuresed with IV Lasix, she is over 2 L negative appears euvolemic now, Lasix changed to p.o. -Last echo with depressed EF, Myoview in 10/201 9 showed no overt reversible ischemia, medium-sized defects in multiple territories, EF 36% -She is considered a poor candidate for left heart catheterization due to psychiatric illnesses, homelessness/social issues and access to healthcare -Appreciate cardiology input, continue metoprolol and lisinopril -Add Imdur, she likely has underlying CAD -frequent admissions for chest pain, monitor blood pressure -Also mild component of COPD exacerbation improving with steroids, nebs -Wean O2  COPD exacerbation -50+ pack year smoker -Improving, cutdown IV steroids today will transition to oral prednisone from tomorrow -Ambulate, wean off O2 -Counseled regarding smoking cessation  Elevated troponin -Secondary to demand ischemia, could have underlying CAD -Poor candidate for left heart catheterization as outlined above -Medical management with aspirin, metoprolol, low-dose Imdur added today  Acute systolic CHF -As above  Chronic left bundle branch block   Chronic microcytic  anemia -No overt bleeding, anemia panel with severe iron deficiency -Given IV iron and start oral iron at discharge -Needs a screening colonoscopy as outpatient  Anxiety/depression -Continue zoloft and Seroquel  DVT prophylaxis: Lovenox Code Status: Full code Family Communication: No family at bedside Disposition Plan: Home pending improvement in respiratory status  Consultants:   Cards   Procedures:   Antimicrobials:    Subjective: -Breathing better, cough and congestion improving -Anxiety is better controlled as well Objective: Vitals:   10/08/18 2056 10/08/18 2117 10/09/18 0000 10/09/18 0709  BP:  (!) 90/40 (!) 100/50 (!) 105/57  Pulse:  91  97  Resp:    (!) 22  Temp:  99.9 F (37.7 C)  (!) 97.5 F (36.4 C)  TempSrc:  Oral  Oral  SpO2: 92% 92%  99%  Weight:      Height:        Intake/Output Summary (Last 24 hours) at 10/09/2018 1042 Last data filed at 10/08/2018 1813 Gross per 24 hour  Intake 100 ml  Output -  Net 100 ml   Filed Weights   10/07/18 0044 10/08/18 0500  Weight: 74.1 kg 68.8 kg    Examination:  Gen: Frail, chronically ill-appearing female, sitting up in bed, alert awake oriented x3 distress today ,  HEENT: PERRLA, Neck supple, no JVD Lungs: Scattered rhonchi, improved air movement, no expiratory wheezes CVS: RRR,No Gallops,Rubs or new Murmurs Abd: soft, Non tender, non distended, BS present Extremities: No edema Skin: no new rashes Psychiatry: Less anxious today    Data Reviewed:   CBC: Recent Labs  Lab 10/06/18 1721 10/06/18 2217 10/07/18 0318 10/08/18 0215 10/09/18 0327  WBC 7.0 8.3 5.6 6.9 11.7*  NEUTROABS 5.3  --   --   --   --   HGB 10.0* 10.1*  8.8* 9.5* 10.4*  HCT 35.0* 35.3* 30.4* 32.8* 35.5*  MCV 81.6 81.1 79.6* 79.6* 78.9*  PLT 308 321 277 255 761   Basic Metabolic Panel: Recent Labs  Lab 10/06/18 1721 10/06/18 2217 10/07/18 0318 10/08/18 0215 10/09/18 0327  NA 139  --  140 139 137  K 3.5  --  3.4* 4.5  4.4  CL 106  --  106 104 98  CO2 21*  --  25 27 28   GLUCOSE 139*  --  113* 105* 167*  BUN 13  --  11 16 33*  CREATININE 0.87 0.88 0.83 1.00 1.15*  CALCIUM 8.4*  --  8.1* 8.4* 8.7*  MG  --  2.0  --   --   --    GFR: Estimated Creatinine Clearance: 43.6 mL/min (A) (by C-G formula based on SCr of 1.15 mg/dL (H)). Liver Function Tests: Recent Labs  Lab 10/06/18 1721  AST 36  ALT 40  ALKPHOS 110  BILITOT 0.5  PROT 6.0*  ALBUMIN 3.3*   No results for input(s): LIPASE, AMYLASE in the last 168 hours. No results for input(s): AMMONIA in the last 168 hours. Coagulation Profile: No results for input(s): INR, PROTIME in the last 168 hours. Cardiac Enzymes: Recent Labs  Lab 10/06/18 1721 10/06/18 2217 10/07/18 0318  TROPONINI 0.14* 0.15* 0.11*   BNP (last 3 results) No results for input(s): PROBNP in the last 8760 hours. HbA1C: No results for input(s): HGBA1C in the last 72 hours. CBG: No results for input(s): GLUCAP in the last 168 hours. Lipid Profile: Recent Labs    10/08/18 0215  CHOL 131  HDL 37*  LDLCALC 83  TRIG 55  CHOLHDL 3.5   Thyroid Function Tests: No results for input(s): TSH, T4TOTAL, FREET4, T3FREE, THYROIDAB in the last 72 hours. Anemia Panel: Recent Labs    10/07/18 0915  VITAMINB12 362  FOLATE 13.7  FERRITIN 11  TIBC 298  IRON 14*  RETICCTPCT 1.4   Urine analysis:    Component Value Date/Time   COLORURINE YELLOW 12/25/2017 1048   APPEARANCEUR CLOUDY (A) 12/25/2017 1048   LABSPEC 1.020 12/25/2017 1048   PHURINE 5.0 12/25/2017 1048   GLUCOSEU NEGATIVE 12/25/2017 1048   HGBUR SMALL (A) 12/25/2017 1048   BILIRUBINUR NEGATIVE 12/25/2017 1048   KETONESUR NEGATIVE 12/25/2017 1048   PROTEINUR NEGATIVE 12/25/2017 1048   NITRITE POSITIVE (A) 12/25/2017 1048   LEUKOCYTESUR MODERATE (A) 12/25/2017 1048   Sepsis Labs: @LABRCNTIP (procalcitonin:4,lacticidven:4)  )No results found for this or any previous visit (from the past 240 hour(s)).        Radiology Studies: No results found.      Scheduled Meds: . amitriptyline  50 mg Oral QHS  . aspirin EC  81 mg Oral Daily  . enoxaparin (LOVENOX) injection  40 mg Subcutaneous Q24H  . [START ON 10/10/2018] furosemide  40 mg Oral Daily  . ipratropium-albuterol  3 mL Nebulization Q6H  . isosorbide mononitrate  15 mg Oral Daily  . lisinopril  10 mg Oral Daily  . methylPREDNISolone (SOLU-MEDROL) injection  60 mg Intravenous Q12H  . metoprolol succinate  25 mg Oral Daily  . QUEtiapine  200 mg Oral BID  . sertraline  100 mg Oral Daily   Continuous Infusions:   LOS: 3 days    Time spent: 24min    Domenic Polite, MD [facility]  10/09/2018, 10:42 AM

## 2018-10-10 LAB — CBC
HCT: 34.2 % — ABNORMAL LOW (ref 36.0–46.0)
Hemoglobin: 10.3 g/dL — ABNORMAL LOW (ref 12.0–15.0)
MCH: 23.7 pg — ABNORMAL LOW (ref 26.0–34.0)
MCHC: 30.1 g/dL (ref 30.0–36.0)
MCV: 78.6 fL — AB (ref 80.0–100.0)
Platelets: 278 10*3/uL (ref 150–400)
RBC: 4.35 MIL/uL (ref 3.87–5.11)
RDW: 17 % — ABNORMAL HIGH (ref 11.5–15.5)
WBC: 17.8 10*3/uL — ABNORMAL HIGH (ref 4.0–10.5)
nRBC: 0 % (ref 0.0–0.2)

## 2018-10-10 LAB — BASIC METABOLIC PANEL
Anion gap: 8 (ref 5–15)
BUN: 40 mg/dL — ABNORMAL HIGH (ref 8–23)
CALCIUM: 8.8 mg/dL — AB (ref 8.9–10.3)
CO2: 28 mmol/L (ref 22–32)
Chloride: 101 mmol/L (ref 98–111)
Creatinine, Ser: 1.08 mg/dL — ABNORMAL HIGH (ref 0.44–1.00)
GFR calc Af Amer: 60 mL/min — ABNORMAL LOW (ref >60)
GFR, EST NON AFRICAN AMERICAN: 52 mL/min — AB (ref >60)
Glucose, Bld: 129 mg/dL — ABNORMAL HIGH (ref 70–99)
Potassium: 4.8 mmol/L (ref 3.5–5.1)
Sodium: 137 mmol/L (ref 135–145)

## 2018-10-10 LAB — MAGNESIUM: Magnesium: 2.3 mg/dL (ref 1.7–2.4)

## 2018-10-10 LAB — BRAIN NATRIURETIC PEPTIDE: B Natriuretic Peptide: 154.5 pg/mL — ABNORMAL HIGH (ref 0.0–100.0)

## 2018-10-10 MED ORDER — FERROUS SULFATE 325 (65 FE) MG PO TABS
325.0000 mg | ORAL_TABLET | Freq: Every day | ORAL | Status: DC
  Administered 2018-10-10 – 2018-10-14 (×5): 325 mg via ORAL
  Filled 2018-10-10 (×5): qty 1

## 2018-10-10 NOTE — Progress Notes (Signed)
PROGRESS NOTE  Maria [name] [facility]MC:[id number] DOB: [birth year] DOA: 10/06/2018 PCP: Benito Mccreedy, MD  HPI/Recap of past 36 hours: 73 year old female smoker with history of chronic combined systolic and diastolic CHF, last Myoview showed EF of 35%, chronic left bundle branch block, COPD, presented to the emergency room with shortness of breath progressive over 1 week, poor compliance with medications.  Chest pressure/heaviness with activity for 5 to 6 months.  Upon evaluation in the ED last night chest x-ray showed bilateral pleural effusions, BNP of 862, EKG with chronic left bundle branch block.  10/10/18: Seen an examined. No chest pain or dyspnea.   Assessment/Plan: Principal Problem:   Acute respiratory failure with hypoxia (HCC) Active Problems:   Essential hypertension   COPD (chronic obstructive pulmonary disease) (HCC)   Bipolar disorder (HCC)   CKD (chronic kidney disease) stage 3, GFR 30-59 ml/min (HCC)  Acute respiratory failure with hypoxia (HCC) 2/2 to acute on chronic sCHF vs others -Diuresed with IV Lasix, she is over 2 L negative appears euvolemic now, Lasix changed to p.o. -Last echo with depressed EF, Myoview in 10/201 9 showed no overt reversible ischemia, medium-sized defects in multiple territories, EF 36% -She is considered a poor candidate for left heart catheterization due to psychiatric illnesses, homelessness/social issues and access to healthcare -Appreciate cardiology input, continue metoprolol and lisinopril -C/w Imdur, she likely has underlying CAD -frequent admissions for chest pain, monitor blood pressure -Also mild component of COPD exacerbation improving with steroids, nebs -Wean O2 as tolerated  Small left pleural effusion Independently reviewed latest CXR showed small left pleural effusion Suspect cardiogenic from acute on chronic sCHF  COPD exacerbation -50+ pack year smoker -Improving, cutdown IV steroids today will transition to oral  prednisone from tomorrow -Ambulate, wean off O2 -Counseled regarding smoking cessation  Elevated troponin -Secondary to demand ischemia, could have underlying CAD -Poor candidate for left heart catheterization as outlined above -Medical management with aspirin, metoprolol, low-dose Imdur added today  Acute systolic CHF -As above  Chronic left bundle branch block   Chronic microcytic anemia -No overt bleeding, anemia panel with severe iron deficiency -Given IV iron and start oral iron at discharge -Needs a screening colonoscopy as outpatient  Anxiety/depression -Continue zoloft and Seroquel  DVT prophylaxis: Lovenox Code Status: Full code Family Communication: No family at bedside Disposition Plan: Home pending improvement in respiratory status  Consultants:   Cards   Procedures:   Antimicrobials:     Objective: Vitals:   10/09/18 2011 10/09/18 2339 10/10/18 0712 10/10/18 0742  BP:  (!) 106/55 118/72   Pulse:  84 91   Resp:   18   Temp:  97.8 F (36.6 C) 98 F (36.7 C)   TempSrc:   Oral   SpO2: 92% (!) 88% 96% 95%  Weight:      Height:        Intake/Output Summary (Last 24 hours) at 10/10/2018 1152 Last data filed at 10/10/2018 0800 Gross per 24 hour  Intake 480 ml  Output -  Net 480 ml   Filed Weights   10/07/18 0044 10/08/18 0500  Weight: 74.1 kg 68.8 kg    Exam:  . General: 73 y.o. year-old female well developed well nourished in no acute distress.  Alert and oriented x3. . Cardiovascular: Regular rate and rhythm with no rubs or gallops.  No thyromegaly or JVD noted.   Marland Kitchen Respiratory: Clear to auscultation with no wheezes or rales. Good inspiratory effort. . Abdomen: Soft nontender nondistended with normal  bowel sounds x4 quadrants. . Musculoskeletal: No lower extremity edema. 2/4 pulses in all 4 extremities. Marland Kitchen Psychiatry: Mood is appropriate for condition and setting   Data Reviewed: CBC: Recent Labs  Lab 10/06/18 1721  10/06/18 2217 10/07/18 0318 10/08/18 0215 10/09/18 0327 10/10/18 1121  WBC 7.0 8.3 5.6 6.9 11.7* 17.8*  NEUTROABS 5.3  --   --   --   --   --   HGB 10.0* 10.1* 8.8* 9.5* 10.4* 10.3*  HCT 35.0* 35.3* 30.4* 32.8* 35.5* 34.2*  MCV 81.6 81.1 79.6* 79.6* 78.9* 78.6*  PLT 308 321 277 255 249 338   Basic Metabolic Panel: Recent Labs  Lab 10/06/18 1721 10/06/18 2217 10/07/18 0318 10/08/18 0215 10/09/18 0327  NA 139  --  140 139 137  K 3.5  --  3.4* 4.5 4.4  CL 106  --  106 104 98  CO2 21*  --  25 27 28   GLUCOSE 139*  --  113* 105* 167*  BUN 13  --  11 16 33*  CREATININE 0.87 0.88 0.83 1.00 1.15*  CALCIUM 8.4*  --  8.1* 8.4* 8.7*  MG  --  2.0  --   --   --    GFR: Estimated Creatinine Clearance: 43.6 mL/min (A) (by C-G formula based on SCr of 1.15 mg/dL (H)). Liver Function Tests: Recent Labs  Lab 10/06/18 1721  AST 36  ALT 40  ALKPHOS 110  BILITOT 0.5  PROT 6.0*  ALBUMIN 3.3*   No results for input(s): LIPASE, AMYLASE in the last 168 hours. No results for input(s): AMMONIA in the last 168 hours. Coagulation Profile: No results for input(s): INR, PROTIME in the last 168 hours. Cardiac Enzymes: Recent Labs  Lab 10/06/18 1721 10/06/18 2217 10/07/18 0318  TROPONINI 0.14* 0.15* 0.11*   BNP (last 3 results) No results for input(s): PROBNP in the last 8760 hours. HbA1C: No results for input(s): HGBA1C in the last 72 hours. CBG: No results for input(s): GLUCAP in the last 168 hours. Lipid Profile: Recent Labs    10/08/18 0215  CHOL 131  HDL 37*  LDLCALC 83  TRIG 55  CHOLHDL 3.5   Thyroid Function Tests: No results for input(s): TSH, T4TOTAL, FREET4, T3FREE, THYROIDAB in the last 72 hours. Anemia Panel: No results for input(s): VITAMINB12, FOLATE, FERRITIN, TIBC, IRON, RETICCTPCT in the last 72 hours. Urine analysis:    Component Value Date/Time   COLORURINE YELLOW 12/25/2017 1048   APPEARANCEUR CLOUDY (A) 12/25/2017 1048   LABSPEC 1.020 12/25/2017 1048    PHURINE 5.0 12/25/2017 1048   GLUCOSEU NEGATIVE 12/25/2017 1048   HGBUR SMALL (A) 12/25/2017 1048   BILIRUBINUR NEGATIVE 12/25/2017 1048   KETONESUR NEGATIVE 12/25/2017 1048   PROTEINUR NEGATIVE 12/25/2017 1048   NITRITE POSITIVE (A) 12/25/2017 1048   LEUKOCYTESUR MODERATE (A) 12/25/2017 1048   Sepsis Labs: @LABRCNTIP (procalcitonin:4,lacticidven:4)  )No results found for this or any previous visit (from the past 240 hour(s)).    Studies: No results found.  Scheduled Meds: . amitriptyline  50 mg Oral QHS  . aspirin EC  81 mg Oral Daily  . atorvastatin  20 mg Oral q1800  . enoxaparin (LOVENOX) injection  40 mg Subcutaneous Q24H  . furosemide  40 mg Oral Daily  . ipratropium-albuterol  3 mL Nebulization TID  . isosorbide mononitrate  15 mg Oral Daily  . lisinopril  5 mg Oral Daily  . metoprolol succinate  25 mg Oral Daily  . predniSONE  40 mg Oral Q breakfast  .  QUEtiapine  200 mg Oral BID  . sertraline  100 mg Oral Daily    Continuous Infusions:   LOS: 4 days     Kayleen Memos, MD [facility] Pager [phone]  If 7PM-7AM, please contact night-coverage [email or web address] Password [email or web address] 10/10/2018, 11:52 AM
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
## 2018-10-11 ENCOUNTER — Telehealth: Payer: Self-pay | Admitting: Cardiology

## 2018-10-11 MED ORDER — SODIUM CHLORIDE 3 % IN NEBU
4.0000 mL | INHALATION_SOLUTION | Freq: Three times a day (TID) | RESPIRATORY_TRACT | Status: AC
  Administered 2018-10-11 – 2018-10-13 (×7): 4 mL via RESPIRATORY_TRACT
  Filled 2018-10-11 (×9): qty 4

## 2018-10-11 MED ORDER — DM-GUAIFENESIN ER 30-600 MG PO TB12
2.0000 | ORAL_TABLET | Freq: Two times a day (BID) | ORAL | Status: DC
  Administered 2018-10-11 – 2018-10-14 (×7): 2 via ORAL
  Filled 2018-10-11 (×7): qty 2

## 2018-10-11 NOTE — Telephone Encounter (Signed)
Hospital follow up scheduled with Arnold Long on 2/13 at 10am.

## 2018-10-11 NOTE — Telephone Encounter (Signed)
-----   Message from Ledora Bottcher, [place] sent at 10/10/2018  5:04 PM EST ----- Please schedule a hospital follow up in 1-2 weeks with Dr. Percival Spanish or an APP on his team.   Thanks Angie

## 2018-10-12 ENCOUNTER — Encounter (HOSPITAL_COMMUNITY): Payer: Self-pay | Admitting: Radiology

## 2018-10-12 ENCOUNTER — Inpatient Hospital Stay (HOSPITAL_COMMUNITY): Payer: Medicare HMO

## 2018-10-12 LAB — BASIC METABOLIC PANEL
Anion gap: 14 (ref 5–15)
BUN: 33 mg/dL — ABNORMAL HIGH (ref 8–23)
CHLORIDE: 99 mmol/L (ref 98–111)
CO2: 27 mmol/L (ref 22–32)
Calcium: 8.7 mg/dL — ABNORMAL LOW (ref 8.9–10.3)
Creatinine, Ser: 0.92 mg/dL (ref 0.44–1.00)
GFR calc Af Amer: >60 mL/min (ref >60)
GFR calc non Af Amer: >60 mL/min (ref >60)
GLUCOSE: 93 mg/dL (ref 70–99)
Potassium: 3.8 mmol/L (ref 3.5–5.1)
Sodium: 140 mmol/L (ref 135–145)

## 2018-10-12 LAB — CBC
HEMATOCRIT: 34.1 % — AB (ref 36.0–46.0)
Hemoglobin: 9.9 g/dL — ABNORMAL LOW (ref 12.0–15.0)
MCH: 23.1 pg — ABNORMAL LOW (ref 26.0–34.0)
MCHC: 29 g/dL — ABNORMAL LOW (ref 30.0–36.0)
MCV: 79.5 fL — AB (ref 80.0–100.0)
Platelets: 261 10*3/uL (ref 150–400)
RBC: 4.29 MIL/uL (ref 3.87–5.11)
RDW: 17.2 % — ABNORMAL HIGH (ref 11.5–15.5)
WBC: 9.6 10*3/uL (ref 4.0–10.5)
nRBC: 0 % (ref 0.0–0.2)

## 2018-10-12 MED ORDER — IOPAMIDOL (ISOVUE-370) INJECTION 76%
INTRAVENOUS | Status: AC
  Administered 2018-10-12: 60 mL
  Filled 2018-10-12: qty 100

## 2018-10-12 NOTE — Progress Notes (Signed)
Physical Therapy Evaluation Patient Details Name: Maria [name] MRN: [id number] DOB: 1945-12-27 Today's Date: 10/12/2018   History of Present Illness  73 yo female with onset of acute respiratory failure was admitted for hypoxia and onset of L pleural effusion, CHF and elevated troponin.  PMHx:  L BBB, bipolar disorder, OA, COPD, HTN, ankle fracture, EF 36%  Clinical Impression  Pt was seen with nursing for O2 sats and pulses, noted 92% and 117 pulse after walking 70' on RW on hall.  Pt is down as low as 79% with no O2 bedside, and will anticipate 4L is her activity level O2 need.  Her HHPT referral should allow for assistance with weaning O2 as is appropriate, but at rest using 2L.      Follow Up Recommendations Home health PT;Supervision for mobility/OOB    Equipment Recommendations  Rolling walker with 5" wheels(if pt does not have an adequate walker)    Recommendations for Other Services       Precautions / Restrictions Precautions Precautions: Fall(telemetry) Precaution Comments: monitor pulses and O2 sats Restrictions Weight Bearing Restrictions: No  Use 4L O2 with gait.    Mobility  Bed Mobility Overal bed mobility: Modified Independent             General bed mobility comments: pt using bedrail to support her effort  Transfers Overall transfer level: Needs assistance Equipment used: Rolling walker (2 wheeled) Transfers: Sit to/from Stand Sit to Stand: Min assist         General transfer comment: min assist to power up  Ambulation/Gait Ambulation/Gait assistance: Min guard;+2 physical assistance;+2 safety/equipment Gait Distance (Feet): 70 Feet Assistive device: Rolling walker (2 wheeled);2 person hand held assist(O2 by nursing and portable monitor with PT) Gait Pattern/deviations: Step-through pattern;Wide base of support;Decreased stride length Gait velocity: reduced Gait velocity interpretation: <1.31 ft/sec, indicative of household ambulator General  Gait Details: pt is requiring close supervised effort as her sats are fluctuating briefly to 87% on 4L, but at end of walk was 91% with pulse 117  Stairs            Wheelchair Mobility    Modified Rankin (Stroke Patients Only)       Balance Overall balance assessment: Needs assistance Sitting-balance support: Feet supported Sitting balance-Leahy Scale: Good     Standing balance support: Bilateral upper extremity supported;During functional activity Standing balance-Leahy Scale: Fair Standing balance comment: needs supervised assist as she is dropping O2 sats frequently                             Pertinent Vitals/Pain Pain Assessment: No/denies pain    Home Living Family/patient expects to be discharged to:: Private residence Living Arrangements: Spouse/significant other Available Help at Discharge: Family;Available 24 hours/day Type of Home: Apartment(first floor) Home Access: Stairs to enter Entrance Stairs-Rails: None Entrance Stairs-Number of Steps: 1 Home Layout: One level Home Equipment: Walker - 2 wheels;Wheelchair - manual Additional Comments: pt is with husband who does not ask questions or comment on the visit    Prior Function Level of Independence: Independent         Comments: ADLs and IADLs     Hand Dominance   Dominant Hand: Right    Extremity/Trunk Assessment   Upper Extremity Assessment Upper Extremity Assessment: Overall WFL for tasks assessed    Lower Extremity Assessment Lower Extremity Assessment: Overall WFL for tasks assessed    Cervical / Trunk Assessment Cervical / Trunk  Assessment: Kyphotic  Communication   Communication: No difficulties  Cognition Arousal/Alertness: Awake/alert Behavior During Therapy: Flat affect Overall Cognitive Status: Within Functional Limits for tasks assessed                                 General Comments: pt has not used O2 previously and did not comment on the  findings of the visit      General Comments General comments (skin integrity, edema, etc.): pt had O2 sats on 2L at 92% when PT arrived the second time but floats as low as 79% on room air sitting.  Replaced with 4L to maintain sats for gait, but even at that touched 87% very briefly, then back to 90%.      Exercises     Assessment/Plan    PT Assessment Patient needs continued PT services  PT Problem List Decreased strength;Decreased range of motion;Decreased activity tolerance;Decreased balance;Decreased mobility;Decreased coordination;Decreased cognition;Decreased knowledge of use of DME;Decreased safety awareness;Cardiopulmonary status limiting activity       PT Treatment Interventions DME instruction;Gait training;Stair training;Functional mobility training;Therapeutic activities;Therapeutic exercise;Balance training;Neuromuscular re-education;Patient/family education    PT Goals (Current goals can be found in the Care Plan section)  Acute Rehab PT Goals Patient Stated Goal: to get out to walk PT Goal Formulation: With patient Time For Goal Achievement: 10/26/18 Potential to Achieve Goals: Good    Frequency Min 4X/week   Barriers to discharge Decreased caregiver support husband is not attending to the instructions given to pt    Co-evaluation               AM-PAC PT "6 Clicks" Mobility  Outcome Measure Help needed turning from your back to your side while in a flat bed without using bedrails?: None Help needed moving from lying on your back to sitting on the side of a flat bed without using bedrails?: None Help needed moving to and from a bed to a chair (including a wheelchair)?: A Little Help needed standing up from a chair using your arms (e.g., wheelchair or bedside chair)?: A Little Help needed to walk in hospital room?: A Lot Help needed climbing 3-5 steps with a railing? : Total 6 Click Score: 17    End of Session Equipment Utilized During Treatment:  Oxygen;Gait belt Activity Tolerance: Patient limited by fatigue;Treatment limited secondary to medical complications (Comment) Patient left: in bed;with call bell/phone within reach;with bed alarm set;with family/visitor present;with nursing/sitter in room Nurse Communication: Mobility status PT Visit Diagnosis: Unsteadiness on feet (R26.81);Muscle weakness (generalized) (M62.81);Difficulty in walking, not elsewhere classified (R26.2);Dizziness and giddiness (R42)    Time: 9357-0177 PT Time Calculation (min) (ACUTE ONLY): 26 min   Charges:   PT Evaluation $PT Eval Moderate Complexity: 1 Mod PT Treatments $Gait Training: 8-22 mins       Ramond Dial 10/12/2018, 11:11 AM  Mee Hives, PT MS Acute Rehab Dept. Number: [place] and [place]

## 2018-10-12 NOTE — Progress Notes (Signed)
PROGRESS NOTE  Maria [name] CBJ:[id number] DOB: 30-Sep-1945 DOA: 10/06/2018 PCP: Benito Mccreedy, MD  HPI/Recap of past 74 hours: 73 year old female smoker with history of chronic combined systolic and diastolic CHF, last Myoview showed EF of 35%, chronic left bundle branch block, COPD, presented to the emergency room with shortness of breath progressive over 1 week, poor compliance with medications.  Chest pressure/heaviness with activity for 5 to 6 months.  Upon evaluation in the ED chest x-ray showed bilateral pleural effusions, BNP of 862, EKG with chronic left bundle branch block.  Hospital course complicated by persistent hypoxia requiring higher level of oxygen supplementation.  10/12/2018: Patient seen and examined at her bedside.  Hypoxia is persistent patient desats to 83% on room air at rest and requires 4 L to maintain O2 saturation greater than 90%.  She appears euvolemic.  Will obtain CTA PE to further assess her hypoxia.   Assessment/Plan: Principal Problem:   Acute respiratory failure with hypoxia (HCC) Active Problems:   Essential hypertension   COPD (chronic obstructive pulmonary disease) (HCC)   Bipolar disorder (HCC)   CKD (chronic kidney disease) stage 3, GFR 30-59 ml/min (HCC)  Persistent acute hypoxic respiratory failure suspect multifactorial 2/2 to acute on chronic sCHF vs acute COPD exacerbation -Last echo with depressed EF, Myoview in 06/2018 showed no overt reversible ischemia, medium-sized defects in multiple territories, EF 36% -She is considered a poor candidate for left heart catheterization due to psychiatric illnesses, homelessness/social issues and access to healthcare -Cardiology followed and signed off on 10/10/2018 -Continue metoprolol, lisinopril, Imdur -Obtain CTA PE due to persistent hypoxia with slow improvement -Maintain O2 saturation greater than 92% On p.o. Lasix 40 mg daily Continue COPD medications Continue pulmonary toilet On prednisone  40 mg daily On nebs PRN  Acute on chronic systolic CHF Management as stated above  Acute COPD exacerbation Management as stated above  Small left pleural effusion Independently reviewed latest CXR showed small left pleural effusion Suspect cardiogenic from acute on chronic sCHF  Acute COPD exacerbation -50+ pack year smoker -Smoking cessation counseling done at bedside  Elevated troponin -0.15 and trended down  -secondary to demand ischemia -Poor candidate for left heart catheterization as outlined above -Medical management with aspirin, metoprolol, low-dose Imdur   Chronic left bundle branch block   Chronic microcytic anemia -No overt bleeding, anemia panel with severe iron deficiency -Given IV iron and continue p.o. ferrous sulfate -Needs a screening colonoscopy as outpatient -Hemoglobin stable  Anxiety/depression -Continue zoloft and Seroquel    DVT prophylaxis: Lovenox Code Status: Full code Family Communication: No family at bedside Disposition Plan:  Home when O2 requirement is improved  Consultants:   Cards   Procedures:   Antimicrobials:     Objective: Vitals:   10/12/18 0500 10/12/18 0722 10/12/18 0735 10/12/18 0822  BP:  (!) 108/52    Pulse:  80    Resp:  16    Temp:  98.7 F (37.1 C)    TempSrc:  Oral    SpO2:  (!) 86% 94% 94%  Weight: 69.5 kg     Height:        Intake/Output Summary (Last 24 hours) at 10/12/2018 1040 Last data filed at 10/12/2018 0926 Gross per 24 hour  Intake 136 ml  Output 750 ml  Net -614 ml   Filed Weights   10/07/18 0044 10/08/18 0500 10/12/18 0500  Weight: 74.1 kg 68.8 kg 69.5 kg    Exam:  . General: 73 y.o. year-old female WD WN  NAD A&O x3 . Cardiovascular: RRR no rubs or gallops. No JVD or thyromegaly  . Respiratory: CTA no wheezes or rales.  Good inspiratory effort. . Abdomen: Soft nontender nondistended with normal bowel sounds x4 quadrants. . Musculoskeletal: No lower extremity edema. 2/4  pulses in all 4 extremities. Marland Kitchen Psychiatry: Mood is appropriate for condition and setting   Data Reviewed: CBC: Recent Labs  Lab 10/06/18 1721  10/07/18 0318 10/08/18 0215 10/09/18 0327 10/10/18 1121 10/12/18 0409  WBC 7.0   < > 5.6 6.9 11.7* 17.8* 9.6  NEUTROABS 5.3  --   --   --   --   --   --   HGB 10.0*   < > 8.8* 9.5* 10.4* 10.3* 9.9*  HCT 35.0*   < > 30.4* 32.8* 35.5* 34.2* 34.1*  MCV 81.6   < > 79.6* 79.6* 78.9* 78.6* 79.5*  PLT 308   < > 277 255 249 278 261   < > = values in this interval not displayed.   Basic Metabolic Panel: Recent Labs  Lab 10/06/18 2217 10/07/18 0318 10/08/18 0215 10/09/18 0327 10/10/18 1121 10/12/18 0409  NA  --  140 139 137 137 140  K  --  3.4* 4.5 4.4 4.8 3.8  CL  --  106 104 98 101 99  CO2  --  25 27 28 28 27   GLUCOSE  --  113* 105* 167* 129* 93  BUN  --  11 16 33* 40* 33*  CREATININE 0.88 0.83 1.00 1.15* 1.08* 0.92  CALCIUM  --  8.1* 8.4* 8.7* 8.8* 8.7*  MG 2.0  --   --   --  2.3  --    GFR: Estimated Creatinine Clearance: 54.5 mL/min (by C-G formula based on SCr of 0.92 mg/dL). Liver Function Tests: Recent Labs  Lab 10/06/18 1721  AST 36  ALT 40  ALKPHOS 110  BILITOT 0.5  PROT 6.0*  ALBUMIN 3.3*   No results for input(s): LIPASE, AMYLASE in the last 168 hours. No results for input(s): AMMONIA in the last 168 hours. Coagulation Profile: No results for input(s): INR, PROTIME in the last 168 hours. Cardiac Enzymes: Recent Labs  Lab 10/06/18 1721 10/06/18 2217 10/07/18 0318  TROPONINI 0.14* 0.15* 0.11*   BNP (last 3 results) No results for input(s): PROBNP in the last 8760 hours. HbA1C: No results for input(s): HGBA1C in the last 72 hours. CBG: No results for input(s): GLUCAP in the last 168 hours. Lipid Profile: No results for input(s): CHOL, HDL, LDLCALC, TRIG, CHOLHDL, LDLDIRECT in the last 72 hours. Thyroid Function Tests: No results for input(s): TSH, T4TOTAL, FREET4, T3FREE, THYROIDAB in the last 72  hours. Anemia Panel: No results for input(s): VITAMINB12, FOLATE, FERRITIN, TIBC, IRON, RETICCTPCT in the last 72 hours. Urine analysis:    Component Value Date/Time   COLORURINE YELLOW 12/25/2017 1048   APPEARANCEUR CLOUDY (A) 12/25/2017 1048   LABSPEC 1.020 12/25/2017 1048   PHURINE 5.0 12/25/2017 1048   GLUCOSEU NEGATIVE 12/25/2017 1048   HGBUR SMALL (A) 12/25/2017 1048   BILIRUBINUR NEGATIVE 12/25/2017 1048   KETONESUR NEGATIVE 12/25/2017 1048   PROTEINUR NEGATIVE 12/25/2017 1048   NITRITE POSITIVE (A) 12/25/2017 1048   LEUKOCYTESUR MODERATE (A) 12/25/2017 1048   Sepsis Labs: @LABRCNTIP (procalcitonin:4,lacticidven:4)  )No results found for this or any previous visit (from the past 240 hour(s)).    Studies: No results found.  Scheduled Meds: . amitriptyline  50 mg Oral QHS  . aspirin EC  81 mg Oral Daily  .  atorvastatin  20 mg Oral q1800  . dextromethorphan-guaiFENesin  2 tablet Oral BID  . enoxaparin (LOVENOX) injection  40 mg Subcutaneous Q24H  . ferrous sulfate  325 mg Oral Q breakfast  . furosemide  40 mg Oral Daily  . ipratropium-albuterol  3 mL Nebulization TID  . isosorbide mononitrate  15 mg Oral Daily  . lisinopril  5 mg Oral Daily  . metoprolol succinate  25 mg Oral Daily  . predniSONE  40 mg Oral Q breakfast  . QUEtiapine  200 mg Oral BID  . sertraline  100 mg Oral Daily  . sodium chloride HYPERTONIC  4 mL Nebulization TID    Continuous Infusions:   LOS: 6 days     Kayleen Memos, MD [facility] Pager [phone]  If 7PM-7AM, please contact night-coverage [email or web address] Password [email or web address] 10/12/2018, 10:40 AM

## 2018-10-12 NOTE — Progress Notes (Signed)
SATURATION QUALIFICATIONS: (This note is used to comply with regulatory documentation for home oxygen)  Patient Saturations on Room Air at Rest = 88%  Patient Saturations on Room Air while Ambulating = 81%  Patient Saturations on 4 Liters of oxygen while Ambulating = 92%  Please briefly explain why patient needs home oxygen: Patient displayed SOB while ambulating. She walked to outside the room and back.

## 2018-10-12 NOTE — Progress Notes (Addendum)
PROGRESS NOTE  Maria [name] [id number]:[id number] DOB: Mar 18, 1946 DOA: 10/06/2018 PCP: Benito Mccreedy, MD  HPI/Recap of past 67 hours: 73 year old female smoker with history of chronic combined systolic and diastolic CHF, last Myoview showed EF of 35%, chronic left bundle branch block, COPD, presented to the emergency room with shortness of breath progressive over 1 week, poor compliance with medications.  Chest pressure/heaviness with activity for 5 to 6 months.  Upon evaluation in the ED last night chest x-ray showed bilateral pleural effusions, BNP of 862, EKG with chronic left bundle branch block.  10/10/18: Seen an examined. No chest pain or dyspnea.  10/11/2018: Persistent cough and dyspnea with minimal exertion.   Assessment/Plan: Principal Problem:   Acute respiratory failure with hypoxia (HCC) Active Problems:   Essential hypertension   COPD (chronic obstructive pulmonary disease) (HCC)   Bipolar disorder (HCC)   CKD (chronic kidney disease) stage 3, GFR 30-59 ml/min (HCC)  Acute respiratory failure with hypoxia (HCC) 2/2 to acute on chronic sCHF vs others -Diuresed with IV Lasix, she is over 2 L negative appears euvolemic now, Lasix changed to p.o. -Last echo with depressed EF, Myoview in 10/201 9 showed no overt reversible ischemia, medium-sized defects in multiple territories, EF 36% -She is considered a poor candidate for left heart catheterization due to psychiatric illnesses, homelessness/social issues and access to healthcare -Appreciate cardiology input, continue metoprolol and lisinopril -C/w Imdur, she likely has underlying CAD -frequent admissions for chest pain, monitor blood pressure -Also mild component of COPD exacerbation improving with steroids, nebs -Wean O2 as tolerated -Maintain O2 sat >92%  Small left pleural effusion Independently reviewed latest CXR showed small left pleural effusion Suspect cardiogenic from acute on chronic sCHF  COPD  exacerbation -50+ pack year smoker -Improving, cutdown IV steroids today will transition to oral prednisone from tomorrow -Ambulate, wean off O2 -Counseled regarding smoking cessation  Elevated troponin -Secondary to demand ischemia, could have underlying CAD -Poor candidate for left heart catheterization as outlined above -Medical management with aspirin, metoprolol, low-dose Imdur added today  Acute systolic CHF -As above  Chronic left bundle branch block   Chronic microcytic anemia -No overt bleeding, anemia panel with severe iron deficiency -Given IV iron and start oral iron at discharge -Needs a screening colonoscopy as outpatient  Anxiety/depression -Continue zoloft and Seroquel  DVT prophylaxis: Lovenox Code Status: Full code Family Communication: No family at bedside Disposition Plan: Home pending improvement in respiratory status  Consultants:   Cards   Procedures:   Antimicrobials:     Objective: Vitals:   10/12/18 0500 10/12/18 0722 10/12/18 0735 10/12/18 0822  BP:  (!) 108/52    Pulse:  80    Resp:  16    Temp:  98.7 F (37.1 C)    TempSrc:  Oral    SpO2:  (!) 86% 94% 94%  Weight: 69.5 kg     Height:        Intake/Output Summary (Last 24 hours) at 10/12/2018 1027 Last data filed at 10/12/2018 0926 Gross per 24 hour  Intake 136 ml  Output 750 ml  Net -614 ml   Filed Weights   10/07/18 0044 10/08/18 0500 10/12/18 0500  Weight: 74.1 kg 68.8 kg 69.5 kg    Exam:  . General: 73 y.o. year-old female WD WN NAD A&O x3 . Cardiovascular: RRR no rubs or gallops. No JVD or thyromegaly  . Respiratory: CTA no wheezes or rales. . Abdomen: Soft nontender nondistended with normal bowel sounds x4 quadrants. Marland Kitchen  Musculoskeletal: No lower extremity edema. 2/4 pulses in all 4 extremities. Marland Kitchen Psychiatry: Mood is appropriate for condition and setting   Data Reviewed: CBC: Recent Labs  Lab 10/06/18 1721  10/07/18 0318 10/08/18 0215  10/09/18 0327 10/10/18 1121 10/12/18 0409  WBC 7.0   < > 5.6 6.9 11.7* 17.8* 9.6  NEUTROABS 5.3  --   --   --   --   --   --   HGB 10.0*   < > 8.8* 9.5* 10.4* 10.3* 9.9*  HCT 35.0*   < > 30.4* 32.8* 35.5* 34.2* 34.1*  MCV 81.6   < > 79.6* 79.6* 78.9* 78.6* 79.5*  PLT 308   < > 277 255 249 278 261   < > = values in this interval not displayed.   Basic Metabolic Panel: Recent Labs  Lab 10/06/18 2217 10/07/18 0318 10/08/18 0215 10/09/18 0327 10/10/18 1121 10/12/18 0409  NA  --  140 139 137 137 140  K  --  3.4* 4.5 4.4 4.8 3.8  CL  --  106 104 98 101 99  CO2  --  25 27 28 28 27   GLUCOSE  --  113* 105* 167* 129* 93  BUN  --  11 16 33* 40* 33*  CREATININE 0.88 0.83 1.00 1.15* 1.08* 0.92  CALCIUM  --  8.1* 8.4* 8.7* 8.8* 8.7*  MG 2.0  --   --   --  2.3  --    GFR: Estimated Creatinine Clearance: 54.5 mL/min (by C-G formula based on SCr of 0.92 mg/dL). Liver Function Tests: Recent Labs  Lab 10/06/18 1721  AST 36  ALT 40  ALKPHOS 110  BILITOT 0.5  PROT 6.0*  ALBUMIN 3.3*   No results for input(s): LIPASE, AMYLASE in the last 168 hours. No results for input(s): AMMONIA in the last 168 hours. Coagulation Profile: No results for input(s): INR, PROTIME in the last 168 hours. Cardiac Enzymes: Recent Labs  Lab 10/06/18 1721 10/06/18 2217 10/07/18 0318  TROPONINI 0.14* 0.15* 0.11*   BNP (last 3 results) No results for input(s): PROBNP in the last 8760 hours. HbA1C: No results for input(s): HGBA1C in the last 72 hours. CBG: No results for input(s): GLUCAP in the last 168 hours. Lipid Profile: No results for input(s): CHOL, HDL, LDLCALC, TRIG, CHOLHDL, LDLDIRECT in the last 72 hours. Thyroid Function Tests: No results for input(s): TSH, T4TOTAL, FREET4, T3FREE, THYROIDAB in the last 72 hours. Anemia Panel: No results for input(s): VITAMINB12, FOLATE, FERRITIN, TIBC, IRON, RETICCTPCT in the last 72 hours. Urine analysis:    Component Value Date/Time   COLORURINE  YELLOW 12/25/2017 1048   APPEARANCEUR CLOUDY (A) 12/25/2017 1048   LABSPEC 1.020 12/25/2017 1048   PHURINE 5.0 12/25/2017 1048   GLUCOSEU NEGATIVE 12/25/2017 1048   HGBUR SMALL (A) 12/25/2017 1048   BILIRUBINUR NEGATIVE 12/25/2017 1048   KETONESUR NEGATIVE 12/25/2017 1048   PROTEINUR NEGATIVE 12/25/2017 1048   NITRITE POSITIVE (A) 12/25/2017 1048   LEUKOCYTESUR MODERATE (A) 12/25/2017 1048   Sepsis Labs: @LABRCNTIP (procalcitonin:4,lacticidven:4)  )No results found for this or any previous visit (from the past 240 hour(s)).    Studies: No results found.  Scheduled Meds: . amitriptyline  50 mg Oral QHS  . aspirin EC  81 mg Oral Daily  . atorvastatin  20 mg Oral q1800  . dextromethorphan-guaiFENesin  2 tablet Oral BID  . enoxaparin (LOVENOX) injection  40 mg Subcutaneous Q24H  . ferrous sulfate  325 mg Oral Q breakfast  . furosemide  40 mg Oral Daily  . ipratropium-albuterol  3 mL Nebulization TID  . isosorbide mononitrate  15 mg Oral Daily  . lisinopril  5 mg Oral Daily  . metoprolol succinate  25 mg Oral Daily  . predniSONE  40 mg Oral Q breakfast  . QUEtiapine  200 mg Oral BID  . sertraline  100 mg Oral Daily  . sodium chloride HYPERTONIC  4 mL Nebulization TID    Continuous Infusions:   LOS: 6 days     Kayleen Memos, MD [facility] Pager [phone]  If 7PM-7AM, please contact night-coverage [email or web address] Password [facility] 10/12/2018, 10:27 AM

## 2018-10-12 NOTE — Progress Notes (Signed)
PT Cancellation Note  Patient Details Name: Maria [name] MRN: [id number] DOB: August 21, 1946   Cancelled Treatment:    Reason Eval/Treat Not Completed: Medical issues which prohibited therapy.  Pt is having O2 sats and values are 83-85% so will try again at another time.   Talked with pt about her values and trying at a different time.   Ramond Dial 10/12/2018, 9:13 AM   Mee Hives, PT MS Acute Rehab Dept. Number: [place]

## 2018-10-13 LAB — BASIC METABOLIC PANEL
Anion gap: 9 (ref 5–15)
BUN: 29 mg/dL — ABNORMAL HIGH (ref 8–23)
CALCIUM: 8.8 mg/dL — AB (ref 8.9–10.3)
CO2: 28 mmol/L (ref 22–32)
Chloride: 105 mmol/L (ref 98–111)
Creatinine, Ser: 0.95 mg/dL (ref 0.44–1.00)
GFR calc Af Amer: >60 mL/min (ref >60)
GFR calc non Af Amer: >60 mL/min (ref >60)
Glucose, Bld: 90 mg/dL (ref 70–99)
Potassium: 3.8 mmol/L (ref 3.5–5.1)
Sodium: 142 mmol/L (ref 135–145)

## 2018-10-13 LAB — CBC
HCT: 35.9 % — ABNORMAL LOW (ref 36.0–46.0)
Hemoglobin: 10.5 g/dL — ABNORMAL LOW (ref 12.0–15.0)
MCH: 23.3 pg — ABNORMAL LOW (ref 26.0–34.0)
MCHC: 29.2 g/dL — ABNORMAL LOW (ref 30.0–36.0)
MCV: 79.8 fL — ABNORMAL LOW (ref 80.0–100.0)
Platelets: 294 10*3/uL (ref 150–400)
RBC: 4.5 MIL/uL (ref 3.87–5.11)
RDW: 17.6 % — ABNORMAL HIGH (ref 11.5–15.5)
WBC: 14.4 10*3/uL — ABNORMAL HIGH (ref 4.0–10.5)
nRBC: 0 % (ref 0.0–0.2)

## 2018-10-13 MED ORDER — LEVOFLOXACIN IN D5W 500 MG/100ML IV SOLN
500.0000 mg | INTRAVENOUS | Status: DC
  Administered 2018-10-13 – 2018-10-14 (×2): 500 mg via INTRAVENOUS
  Filled 2018-10-13 (×2): qty 100

## 2018-10-13 NOTE — Progress Notes (Signed)
PROGRESS NOTE  Maria [name]:[id number] DOB: 04-03-1946 DOA: 10/06/2018 PCP: Benito Mccreedy, MD  HPI/Recap of past 67 hours: 73 year old female smoker with history of chronic combined systolic and diastolic CHF, last Myoview showed EF of 35%, chronic left bundle branch block, COPD, medication noncompliance who presented to the emergency room with progressively worsening shortness of breath of one-week duration, associated with a productive cough.  Chest pressure/heaviness with activity for 5 to 6 months. Upon evaluation in the ED chest x-ray showed bilateral pleural effusions, BNP of 862, EKG with chronic left bundle branch block.  Hospital course complicated by persistent hypoxia requiring higher level of oxygen supplementation.  CTA negative for PE but revealing multifocal airspace opacity bilaterally concerning for pneumonia.  Started on cefepime for HCAP present admission.    10/13/2018: Patient seen and examined at her bedside.  Reports persistent cough and dyspnea with minimal exertion.  Started IV cefepime for HCAP, present on admission.  Continue round-the-clock breathing treatments.    Assessment/Plan: Principal Problem:   Acute respiratory failure with hypoxia (HCC) Active Problems:   Essential hypertension   COPD (chronic obstructive pulmonary disease) (HCC)   Bipolar disorder (HCC)   CKD (chronic kidney disease) stage 3, GFR 30-59 ml/min (HCC)  Persistent acute hypoxic respiratory failure suspect multifactorial 2/2 to acute on chronic sCHF vs acute COPD exacerbation versus HCAP, present admission -Presented with persistent worsening dyspnea with productive cough -WBC trended up overnight to 14,000 from 9000 -Independently reviewed CTA done on 10/12/2018 which was negative for PE but revealed multifocal airspace opacities bilaterally concerning for pneumonia  -Start IV cefepime for HCAP which was present on admission -Last echo with depressed EF, Myoview in 06/2018 showed  no overt reversible ischemia, medium-sized defects in multiple territories, EF 36% -She is considered a poor candidate for left heart catheterization due to psychiatric illnesses, homelessness/social issues and access to healthcare -Cardiology followed and signed off on 10/10/2018 -Continue metoprolol, lisinopril, Imdur -Maintain O2 saturation greater than 92% On p.o. Lasix 40 mg daily Continue COPD medications Continue pulmonary toilet On prednisone 40 mg daily On nebs PRN Repeat home O2 evaluation prior to discharge  Acute on chronic systolic CHF Management as stated above  Acute COPD exacerbation Management as stated above  Small left pleural effusion Independently reviewed latest CXR showed small left pleural effusion Suspect cardiogenic from acute on chronic sCHF  Acute COPD exacerbation suspected secondary to HCAP, present on admission -50+ pack year smoker -Smoking cessation counseling done at bedside  Elevated troponin - troponin peaked at 0.15 and trended down  -Suspected secondary to demand ischemia -Poor candidate for left heart catheterization as outlined above -Medical management with aspirin, metoprolol, low-dose Imdur   Chronic left bundle branch block   Chronic microcytic anemia -No overt bleeding, anemia panel with severe iron deficiency -Given IV iron and continue p.o. ferrous sulfate -Needs a screening colonoscopy as outpatient -Hemoglobin is stable Repeat CBC in the morning  Anxiety/depression -Continue zoloft and Seroquel  Ambulatory dysfunction/physical debility PT recommended home health PT with supervision for mobility Also recommended DME rolling walker with 5 inches wheels Fall precautions     DVT prophylaxis: Lovenox Code Status: Full code Family Communication: No family at bedside Disposition Plan:  Home when O2 requirement is improved  Consultants:   Cards   Procedures:   Antimicrobials:     Objective: Vitals:     10/12/18 1909 10/12/18 2300 10/13/18 0753 10/13/18 0856  BP:  (!) 109/51 133/68   Pulse:  74 86  Resp:  18 20   Temp:  98.8 F (37.1 C) 98.6 F (37 C)   TempSrc:  Oral Oral   SpO2: 93% 91% 94% 91%  Weight:      Height:       No intake or output data in the 24 hours ending 10/13/18 1254 Filed Weights   10/07/18 0044 10/08/18 0500 10/12/18 0500  Weight: 74.1 kg 68.8 kg 69.5 kg    Exam:  . General: 73 y.o. year-old female well developed and nourished in no acute distress.  Alert and oriented x3. . Cardiovascular: Regular rate and rhythm with no rubs or gallops.  No JVD or thyromegaly . Respiratory: Mild rales at bases with no wheezes.  Good inspiratory effort.   . Abdomen: Soft nontender nondistended with normal bowel sounds x4 quadrants. . Musculoskeletal: No lower extremity edema. 2/4 pulses in all 4 extremities. Marland Kitchen Psychiatry: Mood is appropriate for condition and setting   Data Reviewed: CBC: Recent Labs  Lab 10/06/18 1721  10/08/18 0215 10/09/18 0327 10/10/18 1121 10/12/18 0409 10/13/18 0535  WBC 7.0   < > 6.9 11.7* 17.8* 9.6 14.4*  NEUTROABS 5.3  --   --   --   --   --   --   HGB 10.0*   < > 9.5* 10.4* 10.3* 9.9* 10.5*  HCT 35.0*   < > 32.8* 35.5* 34.2* 34.1* 35.9*  MCV 81.6   < > 79.6* 78.9* 78.6* 79.5* 79.8*  PLT 308   < > 255 249 278 261 294   < > = values in this interval not displayed.   Basic Metabolic Panel: Recent Labs  Lab 10/06/18 2217  10/08/18 0215 10/09/18 0327 10/10/18 1121 10/12/18 0409 10/13/18 0535  NA  --    < > 139 137 137 140 142  K  --    < > 4.5 4.4 4.8 3.8 3.8  CL  --    < > 104 98 101 99 105  CO2  --    < > 27 28 28 27 28   GLUCOSE  --    < > 105* 167* 129* 93 90  BUN  --    < > 16 33* 40* 33* 29*  CREATININE 0.88   < > 1.00 1.15* 1.08* 0.92 0.95  CALCIUM  --    < > 8.4* 8.7* 8.8* 8.7* 8.8*  MG 2.0  --   --   --  2.3  --   --    < > = values in this interval not displayed.   GFR: Estimated Creatinine Clearance: 52.8  mL/min (by C-G formula based on SCr of 0.95 mg/dL). Liver Function Tests: Recent Labs  Lab 10/06/18 1721  AST 36  ALT 40  ALKPHOS 110  BILITOT 0.5  PROT 6.0*  ALBUMIN 3.3*   No results for input(s): LIPASE, AMYLASE in the last 168 hours. No results for input(s): AMMONIA in the last 168 hours. Coagulation Profile: No results for input(s): INR, PROTIME in the last 168 hours. Cardiac Enzymes: Recent Labs  Lab 10/06/18 1721 10/06/18 2217 10/07/18 0318  TROPONINI 0.14* 0.15* 0.11*   BNP (last 3 results) No results for input(s): PROBNP in the last 8760 hours. HbA1C: No results for input(s): HGBA1C in the last 72 hours. CBG: No results for input(s): GLUCAP in the last 168 hours. Lipid Profile: No results for input(s): CHOL, HDL, LDLCALC, TRIG, CHOLHDL, LDLDIRECT in the last 72 hours. Thyroid Function Tests: No results for input(s): TSH, T4TOTAL, FREET4, T3FREE,  THYROIDAB in the last 72 hours. Anemia Panel: No results for input(s): VITAMINB12, FOLATE, FERRITIN, TIBC, IRON, RETICCTPCT in the last 72 hours. Urine analysis:    Component Value Date/Time   COLORURINE YELLOW 12/25/2017 1048   APPEARANCEUR CLOUDY (A) 12/25/2017 1048   LABSPEC 1.020 12/25/2017 1048   PHURINE 5.0 12/25/2017 1048   GLUCOSEU NEGATIVE 12/25/2017 1048   HGBUR SMALL (A) 12/25/2017 1048   BILIRUBINUR NEGATIVE 12/25/2017 1048   KETONESUR NEGATIVE 12/25/2017 1048   PROTEINUR NEGATIVE 12/25/2017 1048   NITRITE POSITIVE (A) 12/25/2017 1048   LEUKOCYTESUR MODERATE (A) 12/25/2017 1048   Sepsis Labs: @LABRCNTIP (procalcitonin:4,lacticidven:4)  )No results found for this or any previous visit (from the past 240 hour(s)).    Studies: Ct Angio Chest Pe W Or Wo Contrast  Result Date: 10/12/2018 CLINICAL DATA:  Chest pain, shortness of breath. EXAM: CT ANGIOGRAPHY CHEST WITH CONTRAST TECHNIQUE: Multidetector CT imaging of the chest was performed using the standard protocol during bolus administration of  intravenous contrast. Multiplanar CT image reconstructions and MIPs were obtained to evaluate the vascular anatomy. CONTRAST:  59mL ISOVUE-370 IOPAMIDOL (ISOVUE-370) INJECTION 76% COMPARISON:  Radiographs of October 06, 2018. CT scan of April 08, 2018. FINDINGS: Cardiovascular: Satisfactory opacification of the pulmonary arteries to the segmental level. No evidence of pulmonary embolism. Mild cardiomegaly is noted. No pericardial effusion. Mediastinum/Nodes: Thyroid gland and esophagus are unremarkable. Calcified subcarinal adenopathy is noted which most likely represents prior granulomatous disease. Lungs/Pleura: No pneumothorax or pleural effusion is noted. Calcified granuloma is noted in right lower lobe. Multifocal airspace opacities are noted in both lower lobes most consistent with pneumonia. Upper Abdomen: No acute abnormality. Musculoskeletal: No chest wall abnormality. No acute or significant osseous findings. Review of the MIP images confirms the above findings. IMPRESSION: No definite evidence of pulmonary embolus. Multifocal airspace opacities are noted in both lower lobes concerning for pneumonia. Electronically Signed   By: Marijo Conception, M.D.   On: 10/12/2018 14:33    Scheduled Meds: . amitriptyline  50 mg Oral QHS  . aspirin EC  81 mg Oral Daily  . atorvastatin  20 mg Oral q1800  . dextromethorphan-guaiFENesin  2 tablet Oral BID  . enoxaparin (LOVENOX) injection  40 mg Subcutaneous Q24H  . ferrous sulfate  325 mg Oral Q breakfast  . furosemide  40 mg Oral Daily  . ipratropium-albuterol  3 mL Nebulization TID  . isosorbide mononitrate  15 mg Oral Daily  . lisinopril  5 mg Oral Daily  . metoprolol succinate  25 mg Oral Daily  . predniSONE  40 mg Oral Q breakfast  . QUEtiapine  200 mg Oral BID  . sertraline  100 mg Oral Daily  . sodium chloride HYPERTONIC  4 mL Nebulization TID    Continuous Infusions: . levofloxacin (LEVAQUIN) IV 500 mg (10/13/18 0928)     LOS: 7 days      Kayleen Memos, MD [facility] Pager [phone]  If 7PM-7AM, please contact night-coverage [email or web address] Password [facility] 10/13/2018, 12:54 PM

## 2018-10-14 DIAGNOSIS — J431 Panlobular emphysema: Secondary | ICD-10-CM

## 2018-10-14 DIAGNOSIS — F319 Bipolar disorder, unspecified: Secondary | ICD-10-CM

## 2018-10-14 LAB — CBC WITH DIFFERENTIAL/PLATELET
Abs Immature Granulocytes: 0.3 10*3/uL — ABNORMAL HIGH (ref 0.00–0.07)
BASOS PCT: 0 %
Basophils Absolute: 0 10*3/uL (ref 0.0–0.1)
EOS PCT: 0 %
Eosinophils Absolute: 0 10*3/uL (ref 0.0–0.5)
HCT: 39.7 % (ref 36.0–46.0)
Hemoglobin: 11.3 g/dL — ABNORMAL LOW (ref 12.0–15.0)
Immature Granulocytes: 2 %
Lymphocytes Relative: 12 %
Lymphs Abs: 1.6 10*3/uL (ref 0.7–4.0)
MCH: 22.8 pg — ABNORMAL LOW (ref 26.0–34.0)
MCHC: 28.5 g/dL — AB (ref 30.0–36.0)
MCV: 80.2 fL (ref 80.0–100.0)
Monocytes Absolute: 1 10*3/uL (ref 0.1–1.0)
Monocytes Relative: 7 %
Neutro Abs: 10.6 10*3/uL — ABNORMAL HIGH (ref 1.7–7.7)
Neutrophils Relative %: 79 %
Platelets: 331 10*3/uL (ref 150–400)
RBC: 4.95 MIL/uL (ref 3.87–5.11)
RDW: 18.2 % — ABNORMAL HIGH (ref 11.5–15.5)
WBC: 13.5 10*3/uL — ABNORMAL HIGH (ref 4.0–10.5)
nRBC: 0 % (ref 0.0–0.2)

## 2018-10-14 LAB — BASIC METABOLIC PANEL
ANION GAP: 14 (ref 5–15)
BUN: 30 mg/dL — ABNORMAL HIGH (ref 8–23)
CO2: 25 mmol/L (ref 22–32)
Calcium: 8.7 mg/dL — ABNORMAL LOW (ref 8.9–10.3)
Chloride: 105 mmol/L (ref 98–111)
Creatinine, Ser: 0.98 mg/dL (ref 0.44–1.00)
GFR calc Af Amer: >60 mL/min (ref >60)
GFR calc non Af Amer: 58 mL/min — ABNORMAL LOW (ref >60)
Glucose, Bld: 99 mg/dL (ref 70–99)
POTASSIUM: 3.9 mmol/L (ref 3.5–5.1)
Sodium: 144 mmol/L (ref 135–145)

## 2018-10-14 LAB — COMPREHENSIVE METABOLIC PANEL
ALT: 13 U/L (ref 0–44)
AST: 14 U/L — ABNORMAL LOW (ref 15–41)
Albumin: 2.9 g/dL — ABNORMAL LOW (ref 3.5–5.0)
Alkaline Phosphatase: 69 U/L (ref 38–126)
Anion gap: 12 (ref 5–15)
BUN: 31 mg/dL — ABNORMAL HIGH (ref 8–23)
CO2: 25 mmol/L (ref 22–32)
Calcium: 8.8 mg/dL — ABNORMAL LOW (ref 8.9–10.3)
Chloride: 106 mmol/L (ref 98–111)
Creatinine, Ser: 0.92 mg/dL (ref 0.44–1.00)
GFR calc Af Amer: >60 mL/min (ref >60)
Glucose, Bld: 99 mg/dL (ref 70–99)
Potassium: 3.9 mmol/L (ref 3.5–5.1)
Sodium: 143 mmol/L (ref 135–145)
Total Bilirubin: 0.5 mg/dL (ref 0.3–1.2)
Total Protein: 6.5 g/dL (ref 6.5–8.1)

## 2018-10-14 LAB — MAGNESIUM: MAGNESIUM: 2.1 mg/dL (ref 1.7–2.4)

## 2018-10-14 LAB — PHOSPHORUS: PHOSPHORUS: 3 mg/dL (ref 2.5–4.6)

## 2018-10-14 MED ORDER — GUAIFENESIN ER 600 MG PO TB12: 600.0000 mg | ORAL_TABLET | Freq: Two times a day (BID) | ORAL | Status: DC

## 2018-10-14 MED ORDER — ATORVASTATIN CALCIUM 20 MG PO TABS: 20.0000 mg | ORAL_TABLET | Freq: Every day | ORAL | 0 refills | Status: AC

## 2018-10-14 MED ORDER — ONDANSETRON HCL 4 MG PO TABS: 4.0000 mg | ORAL_TABLET | Freq: Four times a day (QID) | ORAL | 0 refills | Status: DC | PRN

## 2018-10-14 MED ORDER — LISINOPRIL 5 MG PO TABS: 5.0000 mg | ORAL_TABLET | Freq: Every day | ORAL | 0 refills | Status: AC

## 2018-10-14 MED ORDER — IPRATROPIUM-ALBUTEROL 0.5-2.5 (3) MG/3ML IN SOLN: 3.0000 mL | RESPIRATORY_TRACT | Status: DC | PRN

## 2018-10-14 MED ORDER — FERROUS SULFATE 325 (65 FE) MG PO TABS: 325.0000 mg | ORAL_TABLET | Freq: Every day | ORAL | 3 refills | Status: AC

## 2018-10-14 MED ORDER — LEVOFLOXACIN 500 MG PO TABS: 500.0000 mg | ORAL_TABLET | Freq: Every day | ORAL | 0 refills | Status: AC

## 2018-10-14 MED ORDER — ASPIRIN 81 MG PO TBEC: 81.0000 mg | DELAYED_RELEASE_TABLET | Freq: Every day | ORAL | 0 refills | Status: AC

## 2018-10-14 MED ORDER — PREDNISONE 20 MG PO TABS: 40.0000 mg | ORAL_TABLET | Freq: Every day | ORAL | 0 refills | Status: AC

## 2018-10-14 MED ORDER — PHENOL 1.4 % MT LIQD: 1.0000 | OROMUCOSAL | 0 refills | Status: AC | PRN

## 2018-10-14 MED ORDER — ACETAMINOPHEN 325 MG PO TABS: 650.0000 mg | ORAL_TABLET | Freq: Four times a day (QID) | ORAL | 0 refills | Status: AC | PRN

## 2018-10-14 MED ORDER — GUAIFENESIN ER 600 MG PO TB12: 600.0000 mg | ORAL_TABLET | Freq: Two times a day (BID) | ORAL | 0 refills | Status: AC

## 2018-10-14 MED ORDER — METOPROLOL SUCCINATE ER 25 MG PO TB24: 25.0000 mg | ORAL_TABLET | Freq: Every day | ORAL | 0 refills | Status: AC

## 2018-10-14 MED ORDER — ALPRAZOLAM 0.25 MG PO TABS: 0.2500 mg | ORAL_TABLET | Freq: Two times a day (BID) | ORAL | 0 refills | Status: AC | PRN

## 2018-10-14 MED ORDER — IPRATROPIUM-ALBUTEROL 0.5-2.5 (3) MG/3ML IN SOLN: 3.0000 mL | RESPIRATORY_TRACT | 0 refills | Status: AC | PRN

## 2018-10-14 MED ORDER — FUROSEMIDE 40 MG PO TABS: 40.0000 mg | ORAL_TABLET | Freq: Every day | ORAL | 0 refills | Status: AC

## 2018-10-14 MED ORDER — ISOSORBIDE MONONITRATE ER 30 MG PO TB24: 15.0000 mg | ORAL_TABLET | Freq: Every day | ORAL | 0 refills | Status: AC

## 2018-10-14 NOTE — Progress Notes (Signed)
CSW received a phone call from the MD. Patient will be discharging with 02.   CSW left a taxi voucher for the patient. Patient will be returning to [facility].   CSW signing off.   Domenic Schwab, MSW, [place]

## 2018-10-14 NOTE — Progress Notes (Signed)
CSW met with the patient at bedside. Patient was alert and oriented. CSW gave the patient a list of housing resources. Patient stated that she would be able to return to [employer]. CSW told the patient that the O2 could be delivered to [facility]. Patient stated that she may need a walker and two bus passes once she medically ready for DC.   CSW will continue to follow.   Domenic Schwab, MSW, [place]

## 2018-10-14 NOTE — Progress Notes (Signed)
SATURATION QUALIFICATIONS: (This note is used to comply with regulatory documentation for home oxygen)  Patient Saturations on Room Air at Rest = 92%  Patient Saturations on Room Air while Ambulating = 89%  Patient Saturations on 2 Liters of oxygen while Ambulating = 93%  Please briefly explain why patient needs home oxygen: Oxygen low while ambulating on Room Air.

## 2018-10-14 NOTE — Care Management Note (Addendum)
Case Management Note  Patient Details  Name: Maria [name] MRN: [id number] Date of Birth: February 16, 1946  Subjective/Objective:    Patient states she is staying at the [employer], she states her insurance covers her medications and she states Ose- Bonsu is her PCP and she usually takes a bus to go to apt.  She does not have transportation, she states she would like to go to [facility] for PCP, NCM will schedule a follow up apt for her at one of the [facility] clinics.  She will also need a couple of bus passes at dc.  Will have CSW to come speak with patient also about social issues.   1/31 Tomi Bamberger RN, BSN- patient may need home oxygen at dc.  NCM spoke with Jermaine with [facility] , informed him that patient will be going to shelter at discharge, he states he can get her oxygen with a portable concentrator.  He states Leroy Sea will be working this weekend and he left [place] a note regarding this. NCM informed Network engineer.              2/3 Tomi Bamberger RN, BSN - patient for [employer], NCM notified Jermaine with [facility] for home oxygen, he will bring the portable concentrator and oxygen to patient's room, and patient will take a cab to [facility]. Patient not able to receive HHPT at a shelter.                   Action/Plan: DC to Shelter.   Expected Discharge Date:                  Expected Discharge Plan:  Homeless Shelter  In-House Referral:  Clinical Social Work  Discharge planning Services  CM Consult  Post Acute Care Choice:  Durable Medical Equipment Choice offered to:  Patient  DME Arranged:  Oxygen DME Agency:  [place]:  NA [place] Agency:     Status of Service:  Completed, signed off  If discussed at [place] of Stay Meetings, dates discussed:    Additional Comments:  Zenon Mayo, RN 10/14/2018, 4:07 PM

## 2018-10-14 NOTE — Discharge Summary (Signed)
Physician Discharge Summary  Maria [name] ZOX:[id number] DOB: 06/07/1946 DOA: 10/06/2018  PCP: Benito Mccreedy, MD  Admit date: 10/06/2018 Discharge date: 10/14/2018  Admitted From: [facility] Disposition: [place] with PT and Home O2  Recommendations for Outpatient Follow-up:  1. Follow up with PCP in 1-2 weeks; Appointment scheduled for you 2. Follow up with Cardiology as an outpatient in 1-2 weeks 3. Follow up with Pulmonary as an outpatient within 1-2 weeks 4. Repeat CXR in 3-6 Weeks 5. Please obtain CMP/CBC, Mag, Phos in one week 6. Please follow up on the following pending results:  Home Health: Yes  Equipment/Devices: [employer] operational officer with a Seat, 2 Liters of O2 via [place]; DME Nebulizer  Discharge Condition: Stable CODE STATUS: FULL CODE Diet recommendation: Heart Healthy Diet with 1200 mL Fluid Restriction   Brief/Interim Summary: The patient is 73 year old female smoker with history of chronic combined systolic and diastolic CHF, last Myoview showed EF of 35%, chronic left bundle branch block, COPD, medication noncompliance and other comorbidites who presented to the emergency room with progressively worsening shortness of breath of one-week duration, associated with a productive cough. Chest pressure/heaviness with activity for 5 to 6 months. Upon evaluation in the ED chest x-ray showed bilateral pleural effusions, BNP of 862, EKG with chronic left bundle branch block.  She was admitted for persistent hypoxic respiratory failure likely secondary to acute on chronic systolic CHF with concomitant COPD exacerbation and HCAP pneumonia.  Cardiology evaluated and patient was diuresed and medications were adjusted.  She has a follow-up with Dr. Percival Spanish in 2 to 3 weeks and Dr. Radford Pax made a final recommendations in her note.  Hospital course was complicated by persistent hypoxia requiring higher level of oxygen supplementation but now she  is on 2 L. CTA negative for PE but revealing multifocal airspace opacity bilaterally concerning for pneumonia.  Started on levofloxacin for HCAP.  She underwent a home with her screen and required 2 L of oxygen.  She is deemed medically stable as her WBC started trending down (likely in the setting of steroid usage).  She requires oxygen at discharge and will need to follow-up with PCP, cardiology and pulmonology in outpatient setting.  She is improved from when she came in and will be discharged with a home nebulizer as well.  Discharge Diagnoses:  Principal Problem:   Acute respiratory failure with hypoxia (HCC) Active Problems:   Essential hypertension   COPD (chronic obstructive pulmonary disease) (HCC)   Bipolar disorder (HCC)   CKD (chronic kidney disease) stage 3, GFR 30-59 ml/min (HCC)  Persistent acute hypoxic respiratory failure suspect multifactorial 2/2 to acute on chronic sCHF with concomitant acute COPD exacerbation; poA HCAP -Presented with persistent worsening dyspnea with productive cough -WBC trended up overnight from 9.6 -> 14.4 but is now 13.5 and in the setting of Steroid Demargination -CTA of the Chest done showed "No definite evidence of pulmonary embolus. Multifocal airspace opacities are noted in both lower lobes concerning for pneumonia" -Started IV Levofloxacin 500 mg q24h for HCAP (do not see how it was present on admission as CXR on 10/06/2018 showed no evidence of PNA) and transitioned to po Levofloxacin for completion of Course -Last echo with depressed EF, Myoview in 06/2018 showed no overt reversible ischemia, medium-sized defects in multiple territories, EF 36% -She is considered a poor candidate for left heart catheterization due to psychiatric illnesses, homelessness/social issues and access to healthcare -Cardiology followed and diuresed and signed off on 10/10/2018 -Per Cardiology c/w  ASA 81 mg po Daily, Atorvastatin 20 mg po Daily, Lasix 40 mg po Daily Imdur  15 mg po Daily, Lisinopril 5 mg po Daily, and Metoprolol-XL 25 mg po Daily  -Continuous Pulse Oximetry and Maintain O2 saturation greater than 92% -Continue Supplemental O2 via [place] and [place] O2 as tolerated -Continue COPD medications and C/w DuoNeb 3 mL q4hprn, Prednisone 40 mg x1 more day for 5 days, and Prn Xopenex prn -Continue pulmonary toilet -Repeat home O2 evaluation prior to discharge and she requires 2 Liters of Continuous O2; Per Social Work and CM can get O2 at [employer]  Acute on Chronic systolic CHF -Improved -Last echo with depressed EF, Myoview in 06/2018 showed no overt reversible ischemia, medium-sized defects in multiple territories, EF 36% -BNP trended down and went from 862.4 on admission and went to 154.5 -Strict I's and O's and Daily Weights; Patient is -4.374 Liters since admission -C/w Lasix 40 mg po Daily Imdur 15 mg po Daily, Lisinopril 5 mg po Daily, and Metoprolol-XL 25 mg po Daily   Elevated Troponin -Relatively Flat as Troponin went from 0.14 -> 0.15 -> 0.11 and not consistent with ACS and likely demand ischemia in the setting of CHF -Cardiology Consulted and evaluated and stated she is not a good candidate for cath with PCI given problems with compliance issues as well as ability to access medications due to her social situation -Per Cardiology c/w ASA 81 mg po Daily, Atorvastatin 20 mg po Daily, Lasix 40 mg po Daily Imdur 15 mg po Daily, Lisinopril 5 mg po Daily, and Metoprolol-XL 25 mg po Daily  -Continuous Pulse Oximetry and Maintain O2 saturation greater than 92%  Acute COPD exacerbation -Needing supplemental O2 now -She is a 50+ Pack Year Smoker -Continue Supplemental O2 via [place] and [place] O2 as tolerated -Continue COPD medications and C/w DuoNeb 3 mL q4hprn, Prednisone 40 mg x1 more day for 5 days, and Prn Xopenex prn -Will write for Nebulizer and will need Pulmonary Follow up at D/C   Small Left Pleural Effusion -CXR on 10/06/2018 showed I -CTA  of the Chest showed  -Suspect cardiogenic from acute on chronic sCHF -C/w Daily Lasix   Tobacco Abuse -Smoking Cessation Counseling given  -Advised Patient against smoking while on Oxygen and she is understandable and agreeable  Chronic left bundle branch block -Follow up with Cardiology as an outpatient with Dr. Percival Spanish in 2-3 weeks   HCAP; not present on Admission -CXR 10/06/2018 showed small bilateral plueral effusions with mild changes of COPD. -There were was NO menton of Pneumonia on this day  -CTA of the Chest 10/12/2018 showed No definite evidence of pulmonary embolus. Multifocal airspace opacities are noted in both lower lobes concerning for pneumonia. -Started IV Levofloxacin and transitioned to po -Continue Supplemental O2 via [place] and Wean O2 as tolerated -Continue COPD medications and C/w DuoNeb 3 mL q4hprn, Prednisone 40 mg x1 more day for 5 days, and Prn Xopenex prn -Will write for Nebulizer and will need Pulmonary Follow up at D/C   Chronic Microcytic Anemia -No overt bleeding, anemia panel with severe iron deficiency -GivenIV iron and continued p.o. ferrous sulfate at discharge -Needs a screening colonoscopyas outpatient -Anemia Panel showed iron level of 14, U IBC of 284, TIBC of 298, saturation ratios of 5%, ferritin level of 11%, folate level of 13.7 and vitamin B12 level of 362 -Hb/Hct is stable at 11.3/39.7 -Continue monitor for signs and symptoms of bleeding and follow-up with PCP to repeat CBC  Depression and Anxiety -Continue with Amitriptyline 50 mg p.o. nightly, Alprazolam 0.25 mg p.o. twice daily, Quetiapine 200 mg p.o. twice daily and Sertraline 100 mg p.o. daily  Ambulatory dysfunction/physical debility -PT recommended home health PT with supervision for mobility -Also recommended DME rolling walker with 5 inches wheels -C/wFall precautions  Leukocytosis -In the setting of Steroid Demargination and HCAP -WBC went from 9.6 -> 14.4 -> 13.5 -Continue  Abx treatment until completion -Continue to Monitor for S/Sx of Infection -Repeat CBC in AM   Discharge Instructions Discharge Instructions    (HEART FAILURE PATIENTS) Call MD:  Anytime you have any of the following symptoms: 1) 3 pound weight gain in 24 hours or 5 pounds in 1 week 2) shortness of breath, with or without a dry hacking cough 3) swelling in the hands, feet or stomach 4) if you have to sleep on extra pillows at night in order to breathe.   Complete by:  As directed    Call MD for:  difficulty breathing, headache or visual disturbances   Complete by:  As directed    Call MD for:  extreme fatigue   Complete by:  As directed    Call MD for:  hives   Complete by:  As directed    Call MD for:  persistant dizziness or light-headedness   Complete by:  As directed    Call MD for:  persistant nausea and vomiting   Complete by:  As directed    Call MD for:  redness, tenderness, or signs of infection (pain, swelling, redness, odor or green/yellow discharge around incision site)   Complete by:  As directed    Call MD for:  severe uncontrolled pain   Complete by:  As directed    Call MD for:  temperature >100.4   Complete by:  As directed    Diet - low sodium heart healthy   Complete by:  As directed    1200 mL Fluid Restriction Diet   Discharge instructions   Complete by:  As directed    You were cared for by a hospitalist during your hospital stay. If you have any questions about your discharge medications or the care you received while you were in the hospital after you are discharged, you can call the unit and ask to speak with the hospitalist on call if the hospitalist that took care of you is not available. Once you are discharged, your primary care physician will handle any further medical issues. Please note that NO REFILLS for any discharge medications will be authorized once you are discharged, as it is imperative that you return to your primary care physician (or establish a  relationship with a primary care physician if you do not have one) for your aftercare needs so that they can reassess your need for medications and monitor your lab values.  Follow up with PCP, Cardiology, and Pulmonary at D/C. Take all medications as prescribed. If symptoms change or worsen please return to the ED for evaluation   Increase activity slowly   Complete by:  As directed      Allergies as of 10/14/2018      Reactions   Penicillins Other (See Comments)   Has patient had a PCN reaction causing immediate rash, facial/tongue/throat swelling, SOB or lightheadedness with hypotension: Yes Has patient had a PCN reaction causing severe rash involving mucus membranes or skin necrosis: Yes Has patient had a PCN reaction that required hospitalization Yes Has patient had a PCN reaction  occurring within the last 10 years: No If all of the above answers are "NO", then may proceed with Cephalosporin use.      Medication List    STOP taking these medications   metoprolol tartrate 25 MG tablet Commonly known as:  LOPRESSOR     TAKE these medications   acetaminophen 325 MG tablet Commonly known as:  TYLENOL Take 2 tablets (650 mg total) by mouth every 6 (six) hours as needed for mild pain (or Fever >/= 101).   ALPRAZolam 0.25 MG tablet Commonly known as:  XANAX Take 1 tablet (0.25 mg total) by mouth 2 (two) times daily as needed for anxiety.   amitriptyline 50 MG tablet Commonly known as:  ELAVIL Take 50 mg by mouth at bedtime.   aspirin 81 MG EC tablet Take 1 tablet (81 mg total) by mouth daily. Start taking on:  October 15, 2018   atorvastatin 20 MG tablet Commonly known as:  LIPITOR Take 1 tablet (20 mg total) by mouth daily at 6 PM.   ferrous sulfate 325 (65 FE) MG tablet Take 1 tablet (325 mg total) by mouth daily with breakfast. Start taking on:  October 15, 2018   furosemide 40 MG tablet Commonly known as:  LASIX Take 1 tablet (40 mg total) by mouth daily. Start  taking on:  October 15, 2018 What changed:    medication strength  how much to take   guaiFENesin 600 MG 12 hr tablet Commonly known as:  MUCINEX Take 1 tablet (600 mg total) by mouth 2 (two) times daily.   ipratropium-albuterol 0.5-2.5 (3) MG/3ML Soln Commonly known as:  DUONEB Take 3 mLs by nebulization every 4 (four) hours as needed.   isosorbide mononitrate 30 MG 24 hr tablet Commonly known as:  IMDUR Take 0.5 tablets (15 mg total) by mouth daily. Start taking on:  October 15, 2018   levofloxacin 500 MG tablet Commonly known as:  LEVAQUIN Take 1 tablet (500 mg total) by mouth daily.   lisinopril 5 MG tablet Commonly known as:  PRINIVIL,ZESTRIL Take 1 tablet (5 mg total) by mouth daily. Start taking on:  October 15, 2018 What changed:    medication strength  how much to take   metoprolol succinate 25 MG 24 hr tablet Commonly known as:  TOPROL-XL Take 1 tablet (25 mg total) by mouth daily. Start taking on:  October 15, 2018   ondansetron 4 MG tablet Commonly known as:  ZOFRAN Take 1 tablet (4 mg total) by mouth every 6 (six) hours as needed for nausea.   phenol 1.4 % Liqd Commonly known as:  CHLORASEPTIC Use as directed 1 spray in the mouth or throat as needed for throat irritation / pain.   predniSONE 20 MG tablet Commonly known as:  DELTASONE Take 2 tablets (40 mg total) by mouth daily with breakfast. Start taking on:  October 15, 2018   QUEtiapine 200 MG tablet Commonly known as:  SEROQUEL Take 1 tablet (200 mg total) by mouth 2 (two) times daily.   sertraline 100 MG tablet Commonly known as:  ZOLOFT Take 100 mg by mouth daily.            Durable Medical Equipment  (From admission, onward)         Start     Ordered   10/14/18 1555  For home use only DME oxygen  Once    Question Answer Comment  Mode or (Route) Nasal cannula   Liters per Minute 2  Frequency Continuous (stationary and portable oxygen unit needed)   Oxygen delivery system  Gas      10/14/18 1554   10/14/18 1554  For home use only DME Nebulizer/meds  Once    Question:  Patient needs a nebulizer to treat with the following condition  Answer:  COPD (chronic obstructive pulmonary disease) ([place])   10/14/18 1553   10/14/18 1553  For home use only DME 4 wheeled rolling walker with seat  Once    Question:  Patient needs a walker to treat with the following condition  Answer:  Generalized weakness   10/14/18 1553         Follow-up Information    [facility] Follow up on 10/28/2018.   Why:  9:am with Asencion Noble for hospital follow up Contact information: [address] [place] [address] [facility] Follow up.   Why:  portable concentrator and oxygen Contact information: [place] [address] [phone]        Minus Breeding, MD. Call.   Specialty:  Cardiology Why:  Follow up within 2-3 weeks Contact information: [address] [place] [address] [phone]          Allergies  Allergen Reactions  . Penicillins Other (See Comments)    Has patient had a PCN reaction causing immediate rash, facial/tongue/throat swelling, SOB or lightheadedness with hypotension: Yes Has patient had a PCN reaction causing severe rash involving mucus membranes or skin necrosis: Yes Has patient had a PCN reaction that required hospitalization Yes Has patient had a PCN reaction occurring within the last 10 years: No If all of the above answers are "NO", then may proceed with Cephalosporin use.    Consultations:  Cardiology  Procedures/Studies: Dg Chest 2 View  Result Date: 10/06/2018 CLINICAL DATA:  Dry cough and shortness of breath for the past 10 days. EXAM: CHEST - 2 VIEW COMPARISON:  09/30/2018. FINDINGS: Normal sized heart. Mildly tortuous and calcified thoracic aorta. Clear lungs. The lungs are hyperexpanded with mildly  prominent interstitial markings. Normal vascularity. Small bilateral pleural effusions. Calcified granuloma at the posterior right lung base. Upper abdominal surgical clips and lumbar spine fixation hardware. IMPRESSION: 1. Small bilateral pleural effusions. 2. Mild changes of COPD. Electronically Signed   By: Claudie Revering M.D.   On: 10/06/2018 17:18   Dg Chest 2 View  Result Date: 09/30/2018 CLINICAL DATA:  Cough EXAM: CHEST - 2 VIEW COMPARISON:  07/10/2018 FINDINGS: Generalized interstitial coarsening with a few Kerley lines seen on the lateral view. Normal heart size and mediastinal contours. No air bronchogram, effusion, or pneumothorax. Calcified granuloma over the right lower lobe and right hilum. Dorsal column stimulator. Artifact from EKG leads. IMPRESSION: Bronchitic or congestive interstitial coarsening. Electronically Signed   By: Monte Fantasia M.D.   On: 09/30/2018 05:04   Ct Angio Chest Pe W Or Wo Contrast  Result Date: 10/12/2018 CLINICAL DATA:  Chest pain, shortness of breath. EXAM: CT ANGIOGRAPHY CHEST WITH CONTRAST TECHNIQUE: Multidetector CT imaging of the chest was performed using the standard protocol during bolus administration of intravenous contrast. Multiplanar CT image reconstructions and MIPs were obtained to evaluate the vascular anatomy. CONTRAST:  84mL ISOVUE-370 IOPAMIDOL (ISOVUE-370) INJECTION 76% COMPARISON:  Radiographs of October 06, 2018. CT scan of April 08, 2018. FINDINGS: Cardiovascular: Satisfactory opacification of the pulmonary arteries to the segmental level. No evidence of pulmonary embolism.  Mild cardiomegaly is noted. No pericardial effusion. Mediastinum/Nodes: Thyroid gland and esophagus are unremarkable. Calcified subcarinal adenopathy is noted which most likely represents prior granulomatous disease. Lungs/Pleura: No pneumothorax or pleural effusion is noted. Calcified granuloma is noted in right lower lobe. Multifocal airspace opacities are noted in both lower  lobes most consistent with pneumonia. Upper Abdomen: No acute abnormality. Musculoskeletal: No chest wall abnormality. No acute or significant osseous findings. Review of the MIP images confirms the above findings. IMPRESSION: No definite evidence of pulmonary embolus. Multifocal airspace opacities are noted in both lower lobes concerning for pneumonia. Electronically Signed   By: Marijo Conception, M.D.   On: 10/12/2018 14:33    Subjective: And examined at bedside and was doing better.  Desaturated slightly on home O2 screen and will be going home with oxygen.  No nausea or vomiting.  States that her shortness of breath is better.  Wheezing has improved.  No other concerns or complaints at [facility] is been deemed medically stable to be discharged she will follow-up with PCP and cardiology in outpatient setting as well as pulmonology.  Discharge Exam: Vitals:   10/14/18 1030 10/14/18 1040  BP:    Pulse:    Resp:    Temp:    SpO2: 92% (!) 88%   Vitals:   10/13/18 2325 10/14/18 0725 10/14/18 1030 10/14/18 1040  BP: 108/60 126/73    Pulse: 79 98    Resp:      Temp: 98.2 F (36.8 C) 98.6 F (37 C)    TempSrc: Oral Oral    SpO2: 93% 92% 92% (!) 88%  Weight:      Height:       General: Pt is alert, awake, not in acute distress Cardiovascular: RRR but slightly on the faster side, S1/S2 +, no rubs, no gallops Respiratory: Diminished bilaterally pacifically at the bases, no wheezing, no rhonchi Abdominal: Soft, NT, ND, bowel sounds + Extremities: Appreciable edema, no cyanosis  The results of significant diagnostics from this hospitalization (including imaging, microbiology, ancillary and laboratory) are listed below for reference.    Microbiology: No results found for this or any previous visit (from the past 240 hour(s)).   Labs: BNP (last 3 results) Recent Labs    07/10/18 2254 10/06/18 1721 10/10/18 1121  BNP 424.8* 862.4* 762.8*   Basic Metabolic Panel: Recent Labs  Lab  10/10/18 1121 10/12/18 0409 10/13/18 0535 10/14/18 0802 10/14/18 0822  NA 137 140 142 144 143  K 4.8 3.8 3.8 3.9 3.9  CL 101 99 105 105 106  CO2 28 27 28 25 25   GLUCOSE 129* 93 90 99 99  BUN 40* 33* 29* 30* 31*  CREATININE 1.08* 0.92 0.95 0.98 0.92  CALCIUM 8.8* 8.7* 8.8* 8.7* 8.8*  MG 2.3  --   --   --  2.1  PHOS  --   --   --   --  3.0   Liver Function Tests: Recent Labs  Lab 10/14/18 0822  AST 14*  ALT 13  ALKPHOS 69  BILITOT 0.5  PROT 6.5  ALBUMIN 2.9*   No results for input(s): LIPASE, AMYLASE in the last 168 hours. No results for input(s): AMMONIA in the last 168 hours. CBC: Recent Labs  Lab 10/09/18 0327 10/10/18 1121 10/12/18 0409 10/13/18 0535 10/14/18 0822  WBC 11.7* 17.8* 9.6 14.4* 13.5*  NEUTROABS  --   --   --   --  10.6*  HGB 10.4* 10.3* 9.9* 10.5* 11.3*  HCT 35.5*  34.2* 34.1* 35.9* 39.7  MCV 78.9* 78.6* 79.5* 79.8* 80.2  PLT 249 278 261 294 331   Cardiac Enzymes: No results for input(s): CKTOTAL, CKMB, CKMBINDEX, TROPONINI in the last 168 hours. BNP: Invalid input(s): POCBNP CBG: No results for input(s): GLUCAP in the last 168 hours. D-Dimer No results for input(s): DDIMER in the last 72 hours. Hgb A1c No results for input(s): HGBA1C in the last 72 hours. Lipid Profile No results for input(s): CHOL, HDL, LDLCALC, TRIG, CHOLHDL, LDLDIRECT in the last 72 hours. Thyroid function studies No results for input(s): TSH, T4TOTAL, T3FREE, THYROIDAB in the last 72 hours.  Invalid input(s): FREET3 Anemia work up No results for input(s): VITAMINB12, FOLATE, FERRITIN, TIBC, IRON, RETICCTPCT in the last 72 hours. Urinalysis    Component Value Date/Time   COLORURINE YELLOW 12/25/2017 1048   APPEARANCEUR CLOUDY (A) 12/25/2017 1048   LABSPEC 1.020 12/25/2017 1048   PHURINE 5.0 12/25/2017 1048   GLUCOSEU NEGATIVE 12/25/2017 1048   HGBUR SMALL (A) 12/25/2017 1048   BILIRUBINUR NEGATIVE 12/25/2017 1048   KETONESUR NEGATIVE 12/25/2017 1048    PROTEINUR NEGATIVE 12/25/2017 1048   NITRITE POSITIVE (A) 12/25/2017 1048   LEUKOCYTESUR MODERATE (A) 12/25/2017 1048   Sepsis Labs Invalid input(s): PROCALCITONIN,  WBC,  LACTICIDVEN Microbiology No results found for this or any previous visit (from the past 240 hour(s)).  Time coordinating discharge: 35 minutes  SIGNED:  Kerney Elbe, DO [facility] 10/14/2018, 4:15 PM Pager is on [place]  If 7PM-7AM, please contact night-coverage [email or web address] Password [email or web address]

## 2018-10-14 NOTE — Progress Notes (Signed)
Physical Therapy Treatment Patient Details Name: Maria [name] MRN: [id number] DOB: 1945/12/11 Today's Date: 10/14/2018    History of Present Illness 73 yo female with onset of acute respiratory failure was admitted for hypoxia and onset of L pleural effusion, CHF and elevated troponin.  PMHx:  L BBB, bipolar disorder, OA, COPD, HTN, ankle fracture, EF 36%    PT Comments    Pt very anxious today and distressed because she thought she was ready to leave hospital until this morning when she ambulated with nursing and realized how weak she was as well as having dizziness. Used rollator for ambulation in therapy session and had pt sit frequently before she became excessively dizzy. O2 sats as low as 89% and HR up to 119 bpm. Last 10' were very hard for her with BLE's shaking and decreased verbalization. Would benefit from rollator at d/c to allow for sitting as needed. PT will continue to follow.    Follow Up Recommendations  Home health PT;Supervision for mobility/OOB     Equipment Recommendations  Other (comment)(would really benefit from rollator with seat)    Recommendations for Other Services       Precautions / Restrictions Precautions Precautions: Fall Precaution Comments: monitor pulses and O2 sats Restrictions Weight Bearing Restrictions: No    Mobility  Bed Mobility Overal bed mobility: Modified Independent             General bed mobility comments: pt able to get on and out of bed without assist, quick impulsive movements  Transfers Overall transfer level: Needs assistance Equipment used: 4-wheeled walker Transfers: Sit to/from Stand Sit to Stand: Min guard         General transfer comment: min-guard for safety, SpO2 92% on RA  Ambulation/Gait Ambulation/Gait assistance: Min assist;Min guard Gait Distance (Feet): 90 Feet(15, 60, 15) Assistive device: 4-wheeled walker Gait Pattern/deviations: Step-through pattern;Decreased stride length;Trunk flexed Gait  velocity: decreased Gait velocity interpretation: <1.31 ft/sec, indicative of household ambulator General Gait Details: vc's for upright posture. Ambulated on RA focusing on taking seated rest before she reached complete fatigue and became dizzy. SpO2 as low as 89%, HR up to 119 bpm. Was very fatigued and shaky last 10' bout.    Stairs             Wheelchair Mobility    Modified Rankin (Stroke Patients Only)       Balance Overall balance assessment: Needs assistance Sitting-balance support: Feet supported Sitting balance-Leahy Scale: Good     Standing balance support: Bilateral upper extremity supported;During functional activity Standing balance-Leahy Scale: Fair Standing balance comment: can stand in static stance without support, needs support for all dynamic activity                            Cognition Arousal/Alertness: Awake/alert Behavior During Therapy: Restless;Agitated;Anxious Overall Cognitive Status: History of cognitive impairments - at baseline                                 General Comments: pt has trouble staying focused due to anxiety and jitteriness      Exercises      General Comments General comments (skin integrity, edema, etc.): pt becomes more anxious and dizzy when fatigued with elevated HR      Pertinent Vitals/Pain Pain Assessment: No/denies pain    Home Living  Prior Function            PT Goals (current goals can now be found in the care plan section) Acute Rehab PT Goals Patient Stated Goal: to get out to walk PT Goal Formulation: With patient Time For Goal Achievement: 10/26/18 Potential to Achieve Goals: Fair Progress towards PT goals: Progressing toward goals    Frequency    Min 4X/week      PT Plan Current plan remains appropriate;Equipment recommendations need to be updated    Co-evaluation              AM-PAC PT "6 Clicks" Mobility   Outcome  Measure  Help needed turning from your back to your side while in a flat bed without using bedrails?: None Help needed moving from lying on your back to sitting on the side of a flat bed without using bedrails?: None Help needed moving to and from a bed to a chair (including a wheelchair)?: A Little Help needed standing up from a chair using your arms (e.g., wheelchair or bedside chair)?: A Little Help needed to walk in hospital room?: A Little Help needed climbing 3-5 steps with a railing? : Total 6 Click Score: 18    End of Session Equipment Utilized During Treatment: Gait belt Activity Tolerance: Patient limited by fatigue Patient left: in bed;with call bell/phone within reach;with bed alarm set Nurse Communication: Mobility status PT Visit Diagnosis: Unsteadiness on feet (R26.81);Muscle weakness (generalized) (M62.81);Difficulty in walking, not elsewhere classified (R26.2);Dizziness and giddiness (R42)     Time: 8921-1941 PT Time Calculation (min) (ACUTE ONLY): 30 min  Charges:  $Gait Training: 23-37 mins                     Leighton Roach, [place]  Pager [phone] Office [place] 10/14/2018, 1:56 PM

## 2018-10-21 ENCOUNTER — Telehealth: Payer: Self-pay | Admitting: Cardiology

## 2018-10-21 NOTE — Telephone Encounter (Signed)
FORWARD TO  PROVIDER WHO IS SEEING THE PATIENT ON 10/24/18 MAYBE ASSISTANCE  FROM HEALT CARE  GUIDE  COULD BE OF ASSISTANCE

## 2018-10-21 NOTE — Telephone Encounter (Signed)
New Message   Rosann Auerbach from [facility] is calling to report that the PT is not taking Lisinopril 5mg , Isosorbide mononitrate 30 mg, Levofloxacin 500mg , ferrous sulfate 325mg   PT just started taking Prednisone 20mg  2x daily  Was suppose to start feb 4th   She was suppose to have home healthcare but Rosann Auerbach says PT is staying in a hotel and she doesn't have home health care  She also states PT was suppose to receive a walker with a seat

## 2018-10-22 NOTE — Telephone Encounter (Signed)
I looked at discharge note and she was supposed to be living in a place from [employer]. We can call in medications, but would follow up with PCP for her walker and for her non-cardiac needs. Need to know where to call in the medications. Can she get them from [facility]? Please refer her back to social services for her needs to clarify her status.

## 2018-10-23 NOTE — Progress Notes (Deleted)
Cardiology Office Note   Date:  10/23/2018   ID:  Maria [name], DOB November 19, 1945, MRN [id number]  PCP:  Maria Mccreedy, MD  Cardiologist: Dr. Percival [name]  No chief complaint on file.    History of Present Illness: Maria [name] is a 73 y.o. female who presents for post hospital follow up after admission for acute systolic CHF, with know history of COPD, Bipolar disorder, anxiety, and chronic LBBB, and tobacco abuse.   She has a history of chronic combined CHF, and multiple admissions for dyspnea. She was noted to have bilateral pleural effusions, and also treated for COPD exacerbation, and HCAP pneumonia. She diuresed 2 liters and was to be on lasix 40 mg daily. EF was 36%. She was started on O2 via [place] at 2 liters. She was to have home health, a walker, and shower chair.   She called our office about receiving these health aids, because she never received them. She apparently is living in a motel. She was continued on lisinopril 5 mg daily, and metoprolol 25 mg daily. Discharge summary noted she was discharged to [facility]. She was followed by a Education officer, museum.   Past Medical History:  Diagnosis Date  . Arthritis   . Bipolar disorder ([place])   . COPD (chronic obstructive pulmonary disease) ([place])   . Fracture, ankle 12/25/2017   closed right ankle fracture  . History of kidney stones   . Hypertension     Past Surgical History:  Procedure Laterality Date  . bladder tack    . BREAST LUMPECTOMY    . CARPAL TUNNEL RELEASE Bilateral   . CHOLECYSTECTOMY    . LAMINECTOMY     neck  . ORIF ANKLE FRACTURE Right 12/26/2017   Procedure: OPEN REDUCTION INTERNAL FIXATION (ORIF) RIGHT BIMALLEOLAR ANKLE FRACTURE;  Surgeon: Maria Killings, MD;  Location: [facility];  Service: Orthopedics;  Laterality: Right;  . RADICAL HYSTERECTOMY    . spinal fusion       Current Outpatient Medications  Medication Sig Dispense Refill  . acetaminophen (TYLENOL) 325 MG tablet Take 2 tablets (650 mg total) by  mouth every 6 (six) hours as needed for mild pain (or Fever >/= 101). 30 tablet 0  . ALPRAZolam (XANAX) 0.25 MG tablet Take 1 tablet (0.25 mg total) by mouth 2 (two) times daily as needed for anxiety. 6 tablet 0  . amitriptyline (ELAVIL) 50 MG tablet Take 50 mg by mouth at bedtime.  5  . aspirin EC 81 MG EC tablet Take 1 tablet (81 mg total) by mouth daily. 30 tablet 0  . atorvastatin (LIPITOR) 20 MG tablet Take 1 tablet (20 mg total) by mouth daily at 6 PM. 30 tablet 0  . ferrous sulfate 325 (65 FE) MG tablet Take 1 tablet (325 mg total) by mouth daily with breakfast. 30 tablet 3  . furosemide (LASIX) 40 MG tablet Take 1 tablet (40 mg total) by mouth daily. 30 tablet 0  . guaiFENesin (MUCINEX) 600 MG 12 hr tablet Take 1 tablet (600 mg total) by mouth 2 (two) times daily. 10 tablet 0  . ipratropium-albuterol (DUONEB) 0.5-2.5 (3) MG/3ML SOLN Take 3 mLs by nebulization every 4 (four) hours as needed. 360 mL 0  . isosorbide mononitrate (IMDUR) 30 MG 24 hr tablet Take 0.5 tablets (15 mg total) by mouth daily. 30 tablet 0  . levofloxacin (LEVAQUIN) 500 MG tablet Take 1 tablet (500 mg total) by mouth daily. 5 tablet 0  . lisinopril (PRINIVIL,ZESTRIL) 5 MG tablet Take 1  tablet (5 mg total) by mouth daily. 30 tablet 0  . metoprolol succinate (TOPROL-XL) 25 MG 24 hr tablet Take 1 tablet (25 mg total) by mouth daily. 30 tablet 0  . ondansetron (ZOFRAN) 4 MG tablet Take 1 tablet (4 mg total) by mouth every 6 (six) hours as needed for nausea. 20 tablet 0  . phenol (CHLORASEPTIC) 1.4 % LIQD Use as directed 1 spray in the mouth or throat as needed for throat irritation / pain. 1 Bottle 0  . predniSONE (DELTASONE) 20 MG tablet Take 2 tablets (40 mg total) by mouth daily with breakfast. 2 tablet 0  . QUEtiapine (SEROQUEL) 200 MG tablet Take 1 tablet (200 mg total) by mouth 2 (two) times daily. 10 tablet 0  . sertraline (ZOLOFT) 100 MG tablet Take 100 mg by mouth daily.     No current facility-administered  medications for this visit.     Allergies:   Penicillins    Social History:  The patient  reports that she has been smoking cigarettes. She has a 28.00 pack-year smoking history. She has never used smokeless tobacco. She reports that she does not drink alcohol or use drugs.   Family History:  The patient's family history includes Hypertension in her mother.    ROS: All other systems are reviewed and negative. Unless otherwise mentioned in H&P    PHYSICAL EXAM: VS:  There were no vitals taken for this visit. , BMI There is no height or weight on file to calculate BMI. GEN: Well nourished, well developed, in no acute distress HEENT: normal Neck: no JVD, carotid bruits, or masses Cardiac: ***RRR; no murmurs, rubs, or gallops,no edema  Respiratory:  Clear to auscultation bilaterally, normal work of breathing GI: soft, nontender, nondistended, + BS MS: no deformity or atrophy Skin: warm and dry, no rash Neuro:  Strength and sensation are intact Psych: euthymic mood, full affect   EKG:  EKG {ACTION; IS/IS [id number]} ordered today. The ekg ordered today demonstrates ***   Recent Labs: 10/10/2018: B Natriuretic Peptide 154.5 10/14/2018: ALT 13; BUN 31; Creatinine, Ser 0.92; Hemoglobin 11.3; Magnesium 2.1; Platelets 331; Potassium 3.9; Sodium 143    Lipid Panel    Component Value Date/Time   CHOL 131 10/08/2018 0215   TRIG 55 10/08/2018 0215   HDL 37 (L) 10/08/2018 0215   CHOLHDL 3.5 10/08/2018 0215   VLDL 11 10/08/2018 0215   LDLCALC 83 10/08/2018 0215      Wt Readings from Last 3 Encounters:  10/12/18 153 lb 3.5 oz (69.5 kg)  07/10/18 165 lb (74.8 kg)  04/09/18 170 lb 12.8 oz (77.5 kg)      Other studies Reviewed: Echo 04/08/18 Study Conclusions - Left ventricle: The cavity size was normal. Systolic function overall appears reduced Images were inadequate for LV wall motion assessment even with definity There was an increased relative contribution of atrial  contraction to ventricular filling. Doppler parameters are consistent with abnormal left ventricular relaxation (grade 1 diastolic dysfunction). Doppler parameters are consistent with high ventricular filling pressure. - Aortic valve: Poorly visualized. - Aorta: The aorta was poorly visualized. - Mitral valve: Poorly visualized. - Pulmonic valve: Poorly visualized. - Pulmonary arteries: Systolic pressure could not be accurately estimated.  Impressions: - Very poor acoustical windows preclude accurate assessment of EF(even with definity contrast), wall motion and valvular function. Recommend cardiac MRI.  ASSESSMENT AND PLAN:  1.  ***   Current medicines are reviewed at length with the patient today.    Labs/ tests  ordered today include: *** Phill Myron. [place] Pugh, ANP, AACC   10/23/2018 4:53 PM    [facility] [address] Office [phone] Fax [phone]

## 2018-10-24 ENCOUNTER — Ambulatory Visit: Payer: Medicare HMO | Admitting: Adult Health

## 2018-10-25 ENCOUNTER — Encounter: Payer: Self-pay | Admitting: *Deleted

## 2018-10-27 NOTE — Progress Notes (Deleted)
Subjective:    Patient ID: Maria [name], female    DOB: 18-Jun-1946, 73 y.o.   MRN: [id number]  72 y.o.F with recent admit as below:  Admit date: 10/06/2018 Discharge date: 10/14/2018  Admitted From: [place] Disposition: [place] with PT and Home O2  Recommendations for Outpatient Follow-up:  1. Follow up with PCP in 1-2 weeks; Appointment scheduled for you 2. Follow up with Cardiology as an outpatient in 1-2 weeks 3. Follow up with Pulmonary as an outpatient within 1-2 weeks 4. Repeat CXR in 3-6 Weeks 5. Please obtain CMP/CBC, Mag, Phos in one week 6. Please follow up on the following pending results:  Home Health: Yes  Equipment/Devices: Diplomatic Services operational officer with a Seat, 2 Liters of O2 via [place]; DME Nebulizer  Discharge Condition: Stable CODE STATUS: FULL CODE Diet recommendation: Heart Healthy Diet with 1200 mL Fluid Restriction   Brief/Interim Summary: The patient is 73 year old female smoker with history of chronic combined systolic and diastolic CHF, last Myoview showed EF of 35%, chronic left bundle branch block, COPD,medication noncompliance and other comorbidites whopresented to the emergency room withprogressively worseningshortness of breathof one-week duration,associated with a productive cough. Chest pressure/heaviness with activity for 5 to 6 months. Upon evaluation in the ED chest x-ray showed bilateral pleural effusions, BNP of 862, EKG with chronic left bundle branch block.  She was admitted for persistent hypoxic respiratory failure likely secondary to acute on chronic systolic CHF with concomitant COPD exacerbation and HCAP pneumonia.  Cardiology evaluated and patient was diuresed and medications were adjusted.  She has a follow-up with Dr. Percival Spanish in 2 to 3 weeks and Dr. Radford Pax made a final recommendations in her note.  Hospital course was complicated by persistent hypoxia requiring higher level of oxygen  supplementation but now she is on 2 L.CTAnegative for PE but revealing multifocal airspace opacity bilaterally concerning for pneumonia.Started on levofloxacin for HCAP.  She underwent a home with her screen and required 2 L of oxygen.  She is deemed medically stable as her WBC started trending down (likely in the setting of steroid usage).  She requires oxygen at discharge and will need to follow-up with PCP, cardiology and pulmonology in outpatient setting.  She is improved from when she came in and will be discharged with a home nebulizer as well.  Discharge Diagnoses:  Principal Problem:   Acute respiratory failure with hypoxia (HCC) Active Problems:   Essential hypertension   COPD (chronic obstructive pulmonary disease) (HCC)   Bipolar disorder (HCC)   CKD (chronic kidney disease) stage 3, GFR 30-59 ml/min (HCC)  Persistent acute hypoxic respiratory failure suspect multifactorial 2/2 to acute on chronic sCHF with concomitant acute COPD exacerbation; poA HCAP -Presented with persistent worsening dyspnea with productive cough -WBC trended up overnight from 9.6 -> 14.4 but is now 13.5 and in the setting of Steroid Demargination -CTA of the Chest done showed "No definite evidence of pulmonary embolus. Multifocal airspace opacities are noted in both lower lobes concerning for pneumonia" -Started IV Levofloxacin 500 mg q24h for HCAP (do not see how it was present on admission as CXR on 10/06/2018 showed no evidence of PNA) and transitioned to po Levofloxacin for completion of Course -Last echo with depressed EF, Myoview in 06/2018 showed no overt reversible ischemia, medium-sized defects in multiple territories, EF 36% -She is considered a poor candidate for left heart catheterization due to psychiatric illnesses, homelessness/social issues and access to healthcare -Cardiology followed and diuresed and signed off on 10/10/2018 -  Per Cardiology c/w ASA 81 mg po Daily, Atorvastatin 20 mg po  Daily, Lasix 40 mg po Daily Imdur 15 mg po Daily, Lisinopril 5 mg po Daily, and Metoprolol-XL 25 mg po Daily  -Continuous Pulse Oximetry and Maintain O2 saturation greater than 92% -Continue Supplemental O2 via [place] and [place] O2 as tolerated -Continue COPD medications and C/w DuoNeb 3 mL q4hprn, Prednisone 40 mg x1 more day for 5 days, and Prn Xopenex prn -Continue pulmonary toilet -Repeat home O2 evaluation prior to discharge and she requires 2 Liters of Continuous O2; Per Social Work and CM can get O2 at [employer]  Acute on Chronic systolic CHF -Improved -Last echo with depressed EF, Myoview in 06/2018 showed no overt reversible ischemia, medium-sized defects in multiple territories, EF 36% -BNP trended down and went from 862.4 on admission and went to 154.5 -Strict I's and O's and Daily Weights; Patient is -4.374 Liters since admission -C/w Lasix 40 mg po Daily Imdur 15 mg po Daily, Lisinopril 5 mg po Daily, and Metoprolol-XL 25 mg po Daily   Elevated Troponin -Relatively Flat as Troponin went from 0.14 -> 0.15 -> 0.11 and not consistent with ACS and likely demand ischemia in the setting of CHF -Cardiology Consulted and evaluated and stated she is not a good candidate for cath with PCI given problems with compliance issues as well as ability to access medications due to her social situation -Per Cardiology c/w ASA 81 mg po Daily, Atorvastatin 20 mg po Daily, Lasix 40 mg po Daily Imdur 15 mg po Daily, Lisinopril 5 mg po Daily, and Metoprolol-XL 25 mg po Daily  -Continuous Pulse Oximetry and Maintain O2 saturation greater than 92%  Acute COPD exacerbation -Needing supplemental O2 now -She is a 50+ Pack Year Smoker -Continue Supplemental O2 via [place] and [place]an O2 as tolerated -Continue COPD medications and C/w DuoNeb 3 mL q4hprn, Prednisone 40 mg x1 more day for 5 days, and Prn Xopenex prn -Will write for Nebulizer and will need Pulmonary Follow up at D/C   Small Left Pleural  Effusion -CXR on 10/06/2018 showed I -CTA of the Chest showed  -Suspect cardiogenic from acute on chronic sCHF -C/w Daily Lasix   Tobacco Abuse -Smoking Cessation Counseling given  -Advised Patient against smoking while on Oxygen and she is understandable and agreeable  Chronic left bundle branch block -Follow up with Cardiology as an outpatient with Dr. Percival Spanish in 2-3 weeks   HCAP; not present on Admission -CXR 10/06/2018 showed small bilateral plueral effusions with mild changes of COPD. -There were was NO menton of Pneumonia on this day  -CTA of the Chest 10/12/2018 showed No definite evidence of pulmonary embolus. Multifocal airspace opacities are noted in both lower lobes concerning for pneumonia. -Started IV Levofloxacin and transitioned to po -Continue Supplemental O2 via [place] and Wean O2 as tolerated -Continue COPD medications and C/w DuoNeb 3 mL q4hprn, Prednisone 40 mg x1 more day for 5 days, and Prn Xopenex prn -Will write for Nebulizer and will need Pulmonary Follow up at D/C   Chronic Microcytic Anemia -No overt bleeding, anemia panel with severe iron deficiency -GivenIV iron and continued p.o. ferrous sulfate at discharge -Needs a screening colonoscopyas outpatient -Anemia Panel showed iron level of 14, U IBC of 284, TIBC of 298, saturation ratios of 5%, ferritin level of 11%, folate level of 13.7 and vitamin B12 level of 362 -Hb/Hct is stable at 11.3/39.7 -Continue monitor for signs and symptoms of bleeding and follow-up with PCP to  repeat CBC  Depression and Anxiety -Continue with Amitriptyline 50 mg p.o. nightly, Alprazolam 0.25 mg p.o. twice daily, Quetiapine 200 mg p.o. twice daily and Sertraline 100 mg p.o. daily  Ambulatory dysfunction/physical debility -PT recommended home health PT with supervision for mobility -Also recommended DME rolling walker with 5 inches wheels -C/wFallprecautions  Leukocytosis -In the setting of Steroid Demargination and  HCAP -WBC went from 9.6 -> 14.4 -> 13.5 -Continue Abx treatment until completion -Continue to Monitor for S/Sx of Infection -Repeat CBC in AM      Review of Systems     Objective:   Physical Exam        Assessment & Plan:

## 2018-10-28 ENCOUNTER — Inpatient Hospital Stay: Payer: Medicare HMO | Admitting: Critical Care Medicine

## 2018-10-30 ENCOUNTER — Telehealth: Payer: Self-pay | Admitting: *Deleted

## 2018-10-30 NOTE — Telephone Encounter (Signed)
Medical Assistant left message on patient's home and cell voicemail. Voicemail states to give a call back to [place] with [facility] at [phone].  !!!Patient was scheduled for an OV on 10/28/2018. Please ask patient the reasoning for no showing and offer an available appointment!!

## 2018-11-05 NOTE — Telephone Encounter (Signed)
PT NO-SHOW FOR OUR APPT

## 2019-05-09 IMAGING — CR DG KNEE COMPLETE 4+V*R*
4 series · 4 of 4 positions shown · non-contrast
Comparison: None.

CLINICAL DATA: Right knee pain since a fall approximately 2 hours
ago. Initial encounter.

EXAM:
RIGHT KNEE - COMPLETE 4+ VIEW

[x knee ap right (1 of 3)]
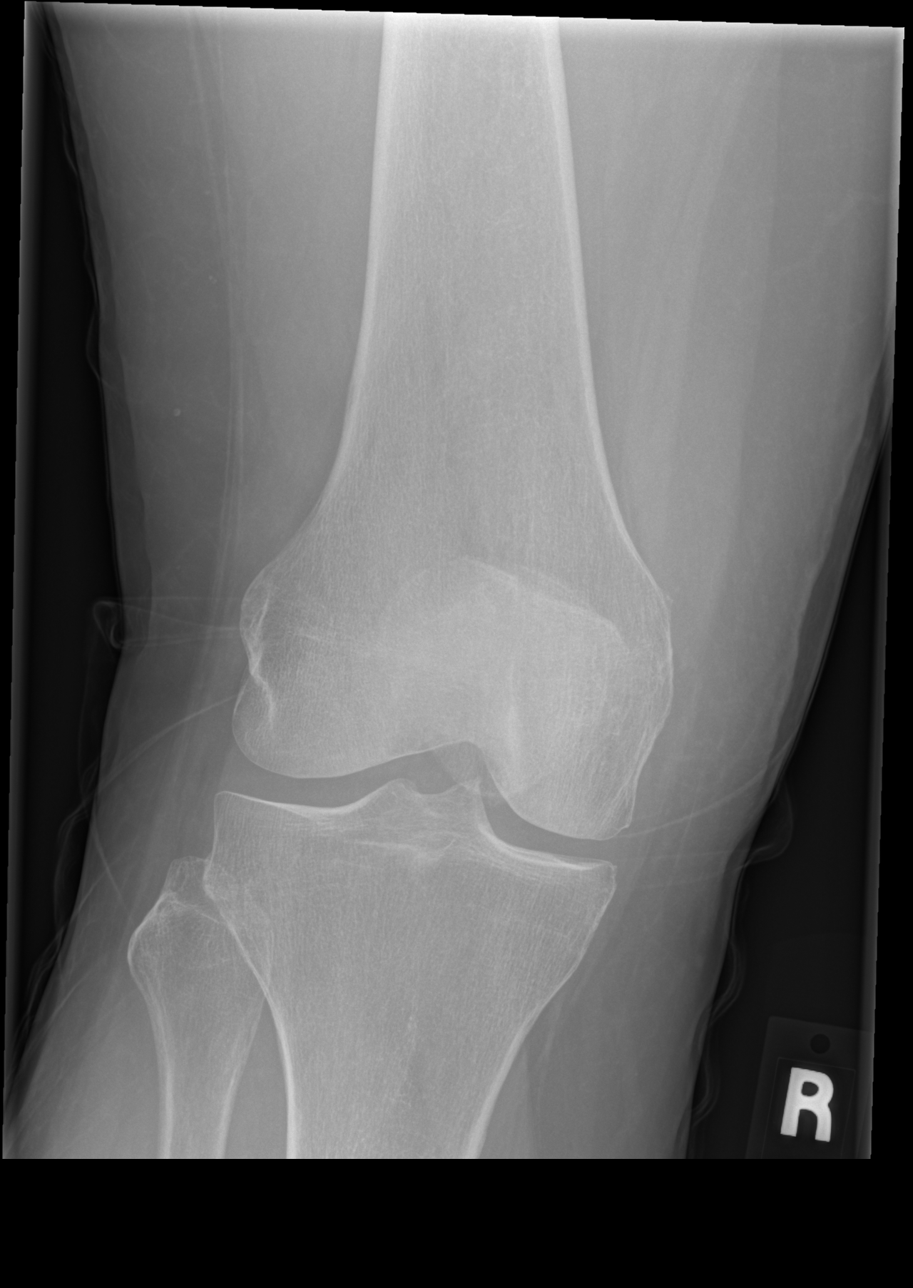

[x knee ap right (2 of 3)]
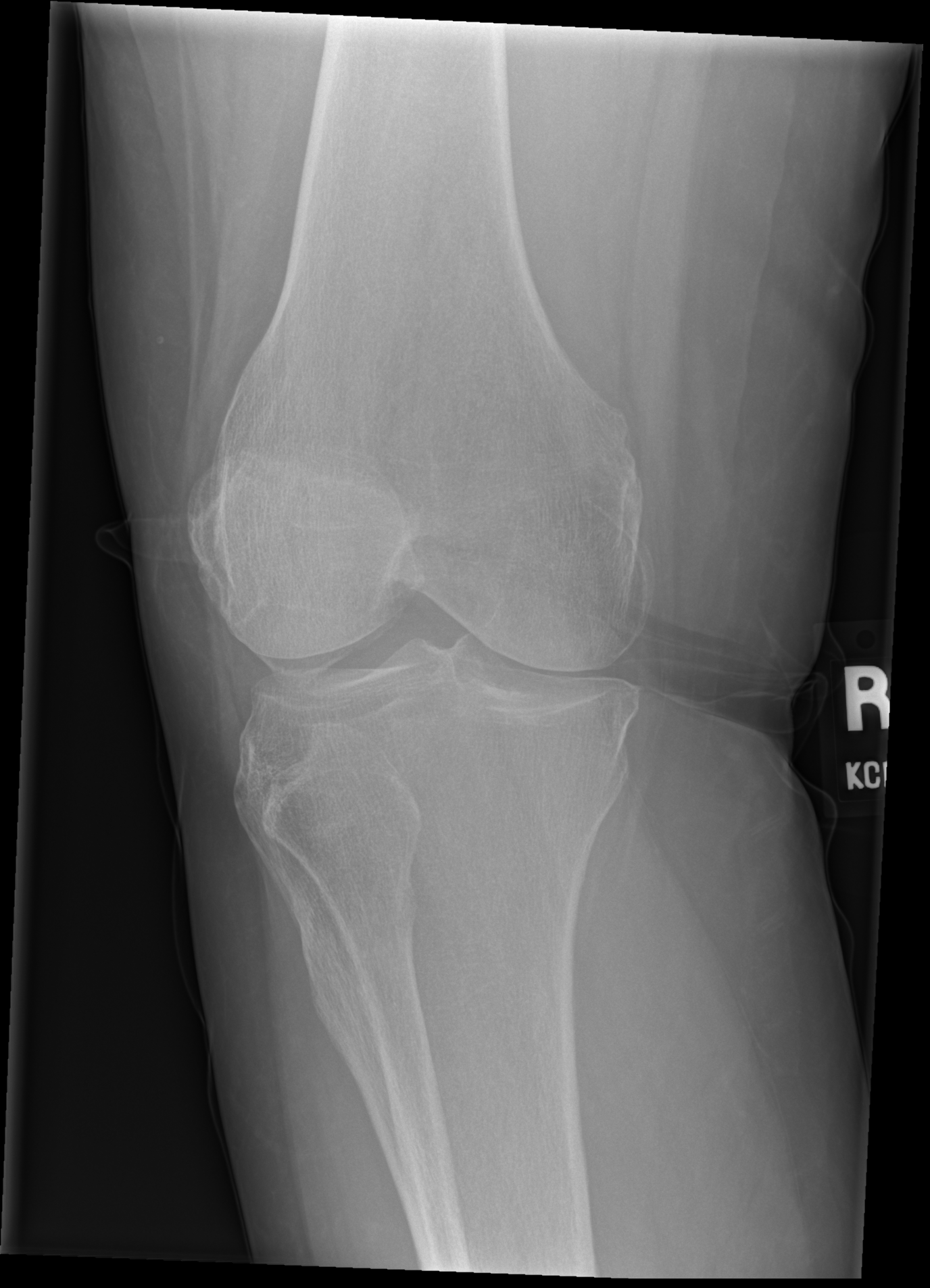

[x knee ap right (3 of 3)]
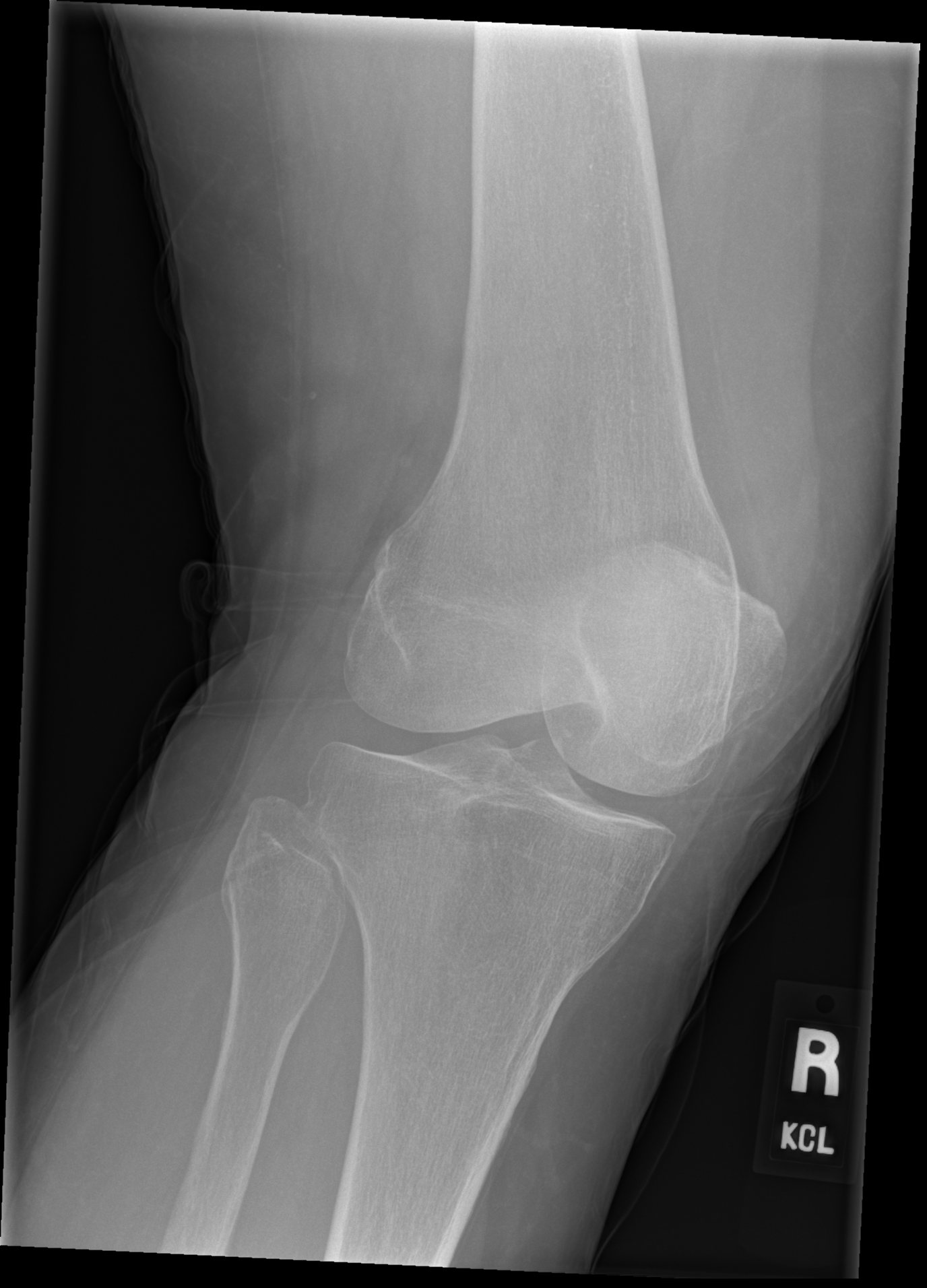

[x knee lat right]
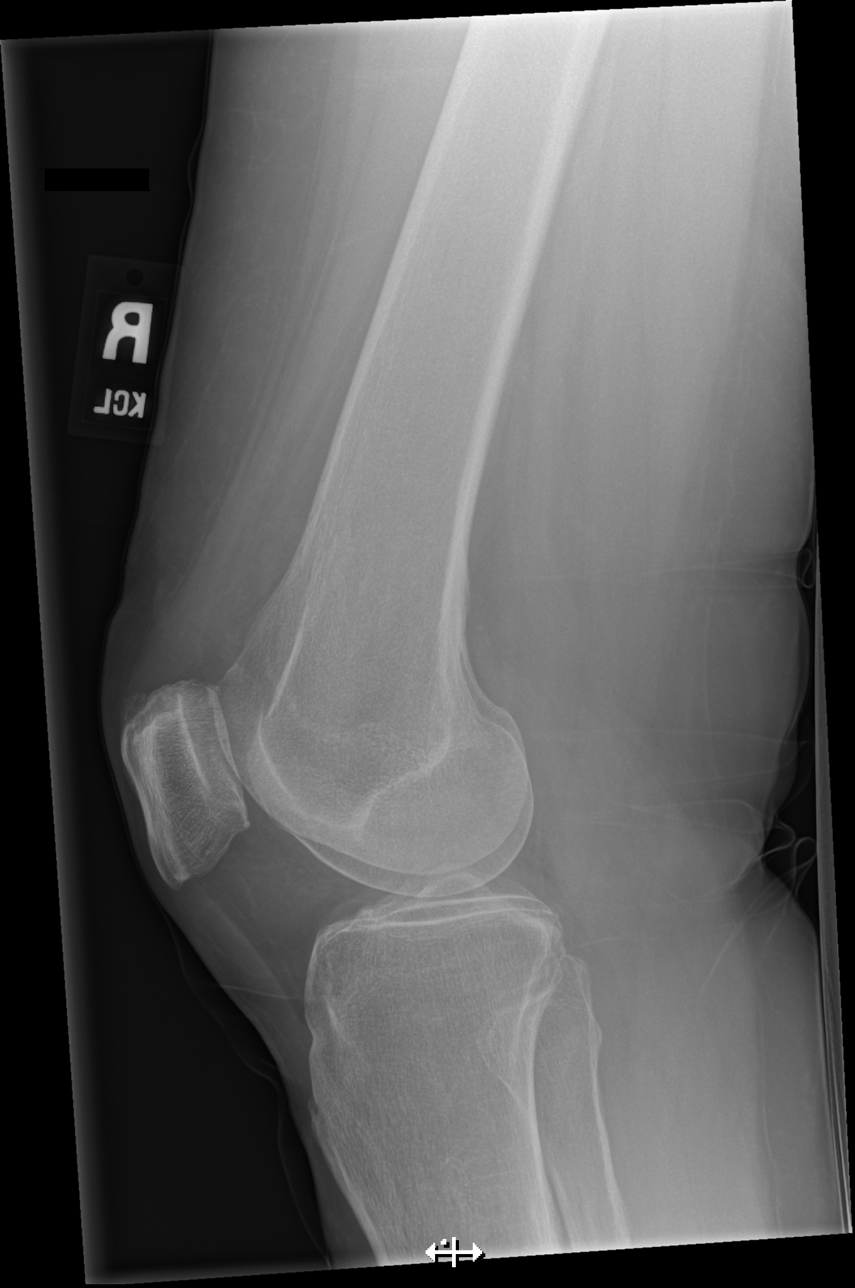

[4 of 4 positions shown; findings below may reference images not displayed]

FINDINGS: No evidence of fracture, dislocation, or joint effusion. No evidence
of arthropathy or other focal bone abnormality. Soft tissues are
unremarkable.
IMPRESSION: Negative exam.

## 2019-05-09 IMAGING — CR DG ANKLE COMPLETE 3+V*R*
3 series · 3 of 3 positions shown · non-contrast
Comparison: None.

CLINICAL DATA: Fall with right ankle pain

EXAM:
RIGHT ANKLE - COMPLETE 3+ VIEW

[x ankle ap right]
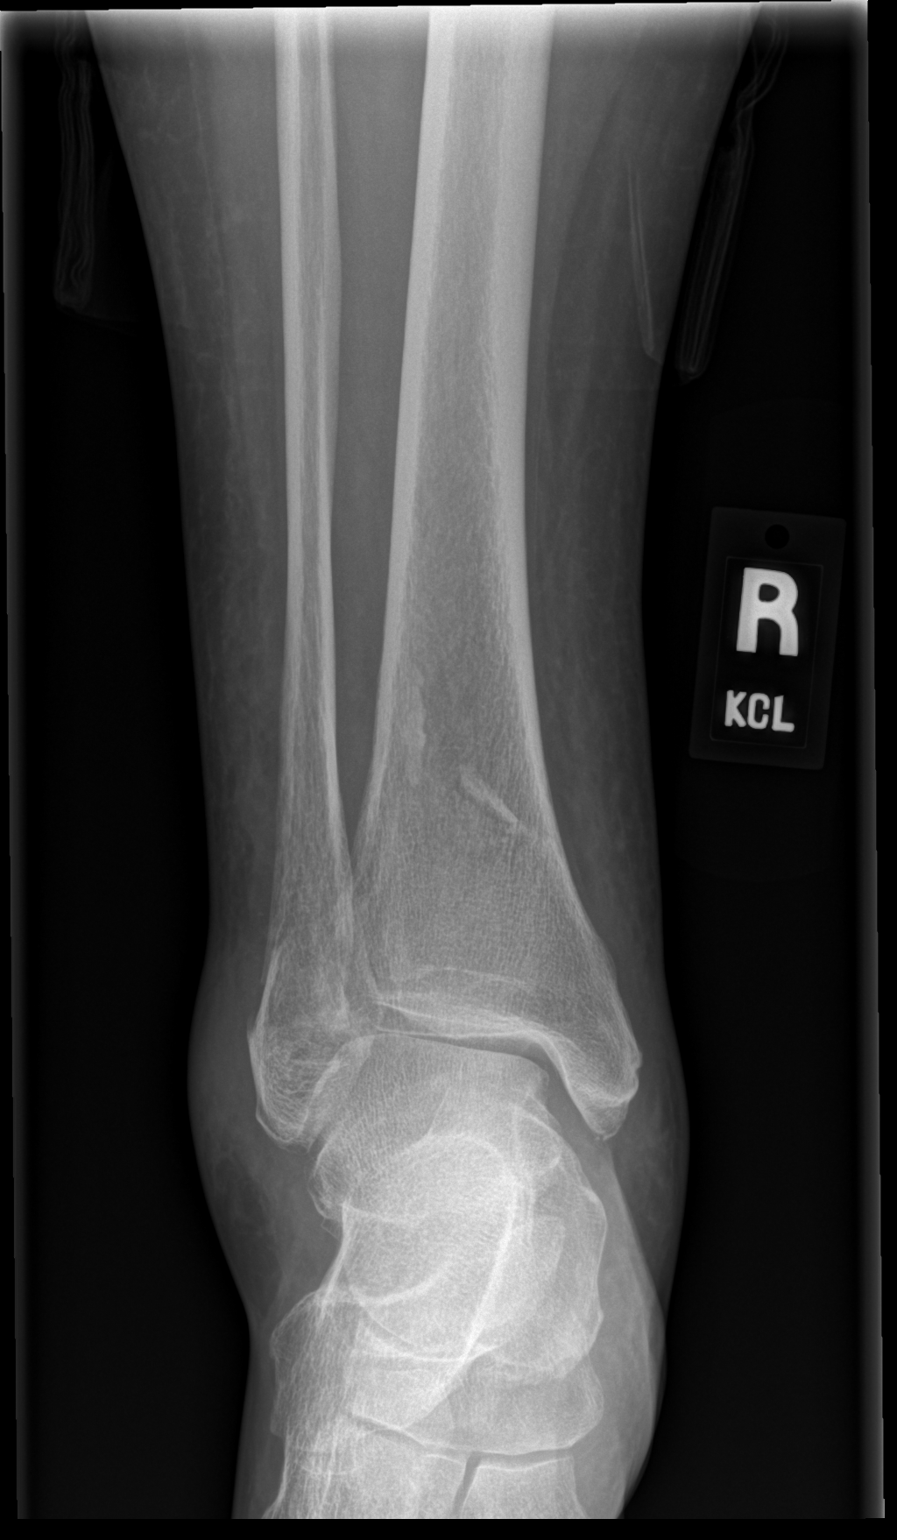

[x ankle obl right]
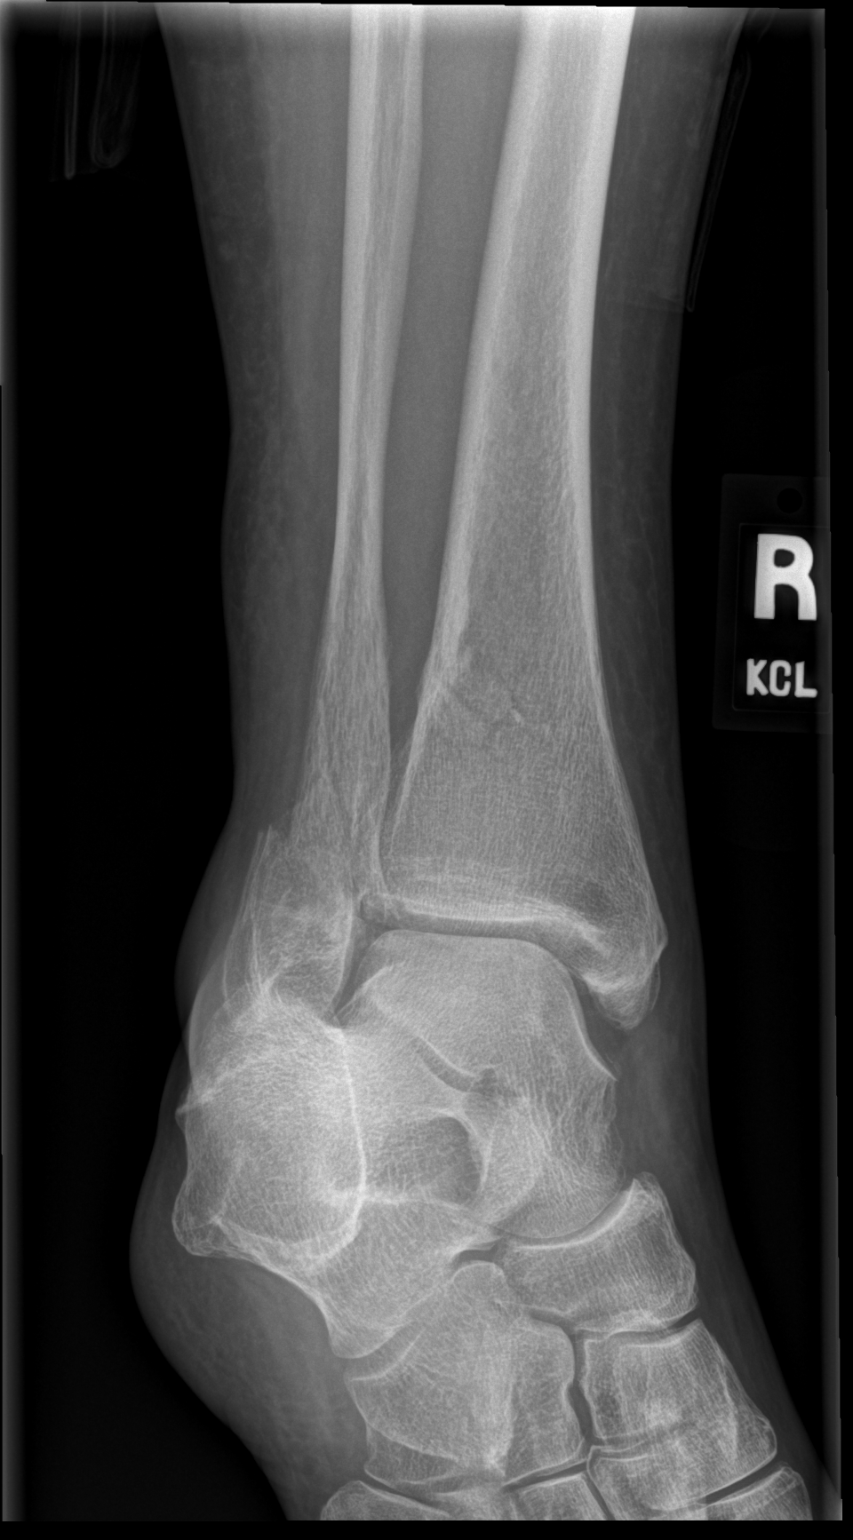

[x ankle lat right]
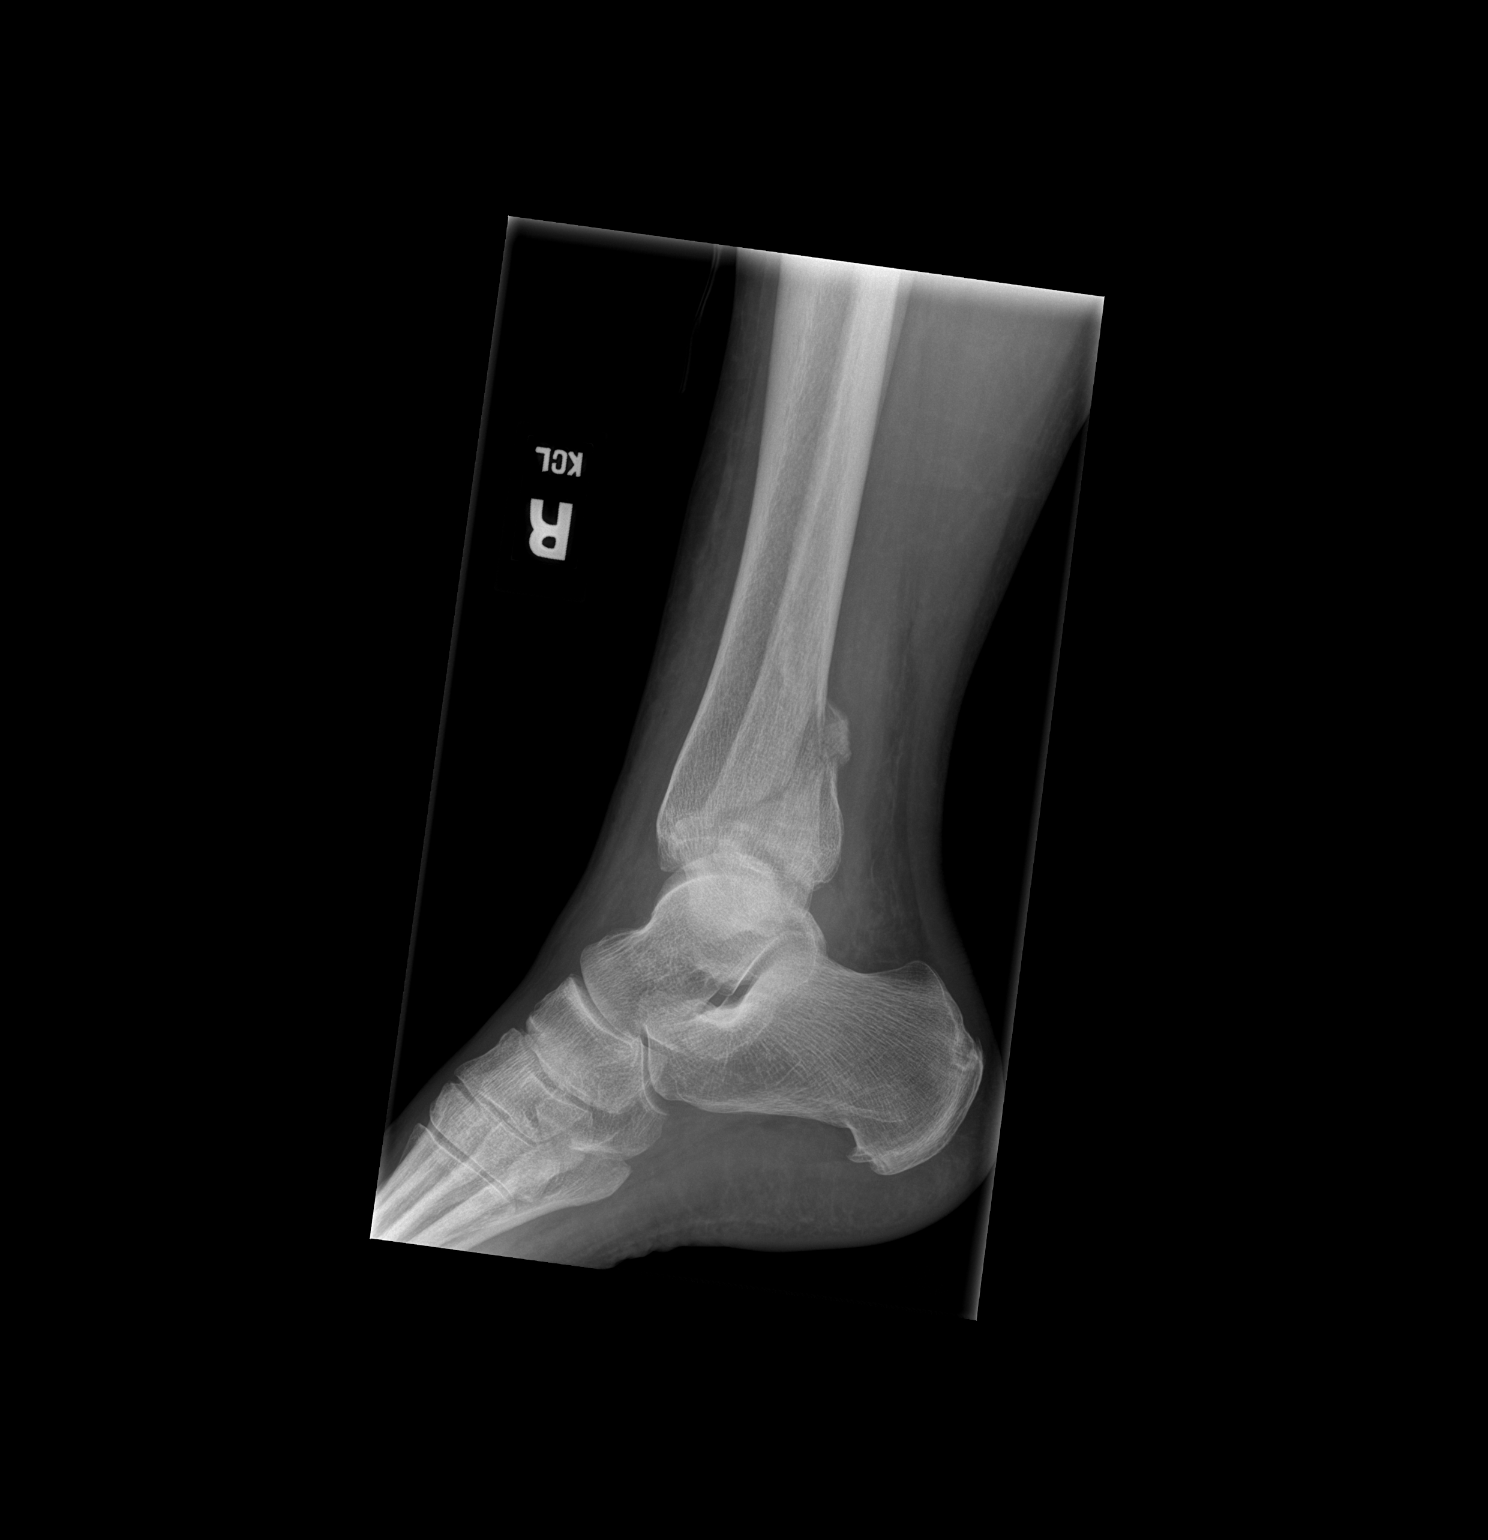

[3 of 3 positions shown; findings below may reference images not displayed]

FINDINGS: There are comminuted fractures of the distal right tibia and fibula.
The lateral malleolus is displaced laterally. Moderate soft tissue
swelling.
IMPRESSION: Comminuted fractures of the distal right tibia and fibula.

## 2019-08-24 ENCOUNTER — Emergency Department (HOSPITAL_COMMUNITY): Payer: Medicare HMO

## 2019-08-24 ENCOUNTER — Other Ambulatory Visit: Payer: Self-pay

## 2019-08-24 ENCOUNTER — Encounter (HOSPITAL_COMMUNITY): Payer: Self-pay | Admitting: Emergency Medicine

## 2019-08-24 ENCOUNTER — Emergency Department (HOSPITAL_COMMUNITY)
Admission: EM | Admit: 2019-08-24 | Discharge: 2019-08-25 | Disposition: A | Discharge status: Home / Self Care | Payer: Medicare HMO | Attending: Emergency Medicine | Admitting: Emergency Medicine

## 2019-08-24 DIAGNOSIS — I130 Hypertensive heart and chronic kidney disease with heart failure and stage 1 through stage 4 chronic kidney disease, or unspecified chronic kidney disease: Secondary | ICD-10-CM | POA: Insufficient documentation

## 2019-08-24 DIAGNOSIS — Z79899 Other long term (current) drug therapy: Secondary | ICD-10-CM | POA: Diagnosis not present

## 2019-08-24 DIAGNOSIS — I509 Heart failure, unspecified: Secondary | ICD-10-CM | POA: Diagnosis not present

## 2019-08-24 DIAGNOSIS — N3000 Acute cystitis without hematuria: Secondary | ICD-10-CM | POA: Diagnosis not present

## 2019-08-24 DIAGNOSIS — R112 Nausea with vomiting, unspecified: Secondary | ICD-10-CM | POA: Diagnosis present

## 2019-08-24 DIAGNOSIS — N183 Chronic kidney disease, stage 3 unspecified: Secondary | ICD-10-CM | POA: Insufficient documentation

## 2019-08-24 DIAGNOSIS — R197 Diarrhea, unspecified: Secondary | ICD-10-CM | POA: Diagnosis not present

## 2019-08-24 DIAGNOSIS — J449 Chronic obstructive pulmonary disease, unspecified: Secondary | ICD-10-CM | POA: Insufficient documentation

## 2019-08-24 DIAGNOSIS — F17210 Nicotine dependence, cigarettes, uncomplicated: Secondary | ICD-10-CM | POA: Insufficient documentation

## 2019-08-24 LAB — COMPREHENSIVE METABOLIC PANEL
ALT: 28 U/L (ref 0–44)
AST: 38 U/L (ref 15–41)
Albumin: 3.4 g/dL — ABNORMAL LOW (ref 3.5–5.0)
Alkaline Phosphatase: 117 U/L (ref 38–126)
Anion gap: 9 (ref 5–15)
BUN: 17 mg/dL (ref 8–23)
CO2: 25 mmol/L (ref 22–32)
Calcium: 8.6 mg/dL — ABNORMAL LOW (ref 8.9–10.3)
Chloride: 109 mmol/L (ref 98–111)
Creatinine, Ser: 0.82 mg/dL (ref 0.44–1.00)
GFR calc Af Amer: >60 mL/min (ref >60)
GFR calc non Af Amer: >60 mL/min (ref >60)
Glucose, Bld: 94 mg/dL (ref 70–99)
Potassium: 4.2 mmol/L (ref 3.5–5.1)
Sodium: 143 mmol/L (ref 135–145)
Total Bilirubin: 0.5 mg/dL (ref 0.3–1.2)
Total Protein: 6.1 g/dL — ABNORMAL LOW (ref 6.5–8.1)

## 2019-08-24 LAB — URINALYSIS, ROUTINE W REFLEX MICROSCOPIC
Bilirubin Urine: NEGATIVE
Glucose, UA: NEGATIVE mg/dL
Ketones, ur: NEGATIVE mg/dL
Nitrite: POSITIVE — AB
Protein, ur: NEGATIVE mg/dL
Specific Gravity, Urine: 1.033 — ABNORMAL HIGH (ref 1.005–1.030)
pH: 5 (ref 5.0–8.0)

## 2019-08-24 LAB — CBC
HCT: 34.1 % — ABNORMAL LOW (ref 36.0–46.0)
Hemoglobin: 9.8 g/dL — ABNORMAL LOW (ref 12.0–15.0)
MCH: 23 pg — ABNORMAL LOW (ref 26.0–34.0)
MCHC: 28.7 g/dL — ABNORMAL LOW (ref 30.0–36.0)
MCV: 80 fL (ref 80.0–100.0)
Platelets: 327 10*3/uL (ref 150–400)
RBC: 4.26 MIL/uL (ref 3.87–5.11)
RDW: 16.3 % — ABNORMAL HIGH (ref 11.5–15.5)
WBC: 5.7 10*3/uL (ref 4.0–10.5)
nRBC: 0 % (ref 0.0–0.2)

## 2019-08-24 LAB — LIPASE, BLOOD: Lipase: 77 U/L — ABNORMAL HIGH (ref 11–51)

## 2019-08-24 MED ORDER — SODIUM CHLORIDE 0.9% FLUSH: 3.0000 mL | Freq: Once | INTRAVENOUS | Status: DC

## 2019-08-24 MED ORDER — IOHEXOL 300 MG/ML  SOLN
100.0000 mL | Freq: Once | INTRAMUSCULAR | Status: AC | PRN
  Administered 2019-08-24: 100 mL via INTRAVENOUS

## 2019-08-24 MED ORDER — IOHEXOL 350 MG/ML SOLN: 100.0000 mL | Freq: Once | INTRAVENOUS | Status: DC | PRN

## 2019-08-24 NOTE — ED Triage Notes (Addendum)
Pt to triage via [facility] from home> c/o body aches, nausea and diarrhea x 4 days.  Vomited x 1 today.

## 2019-08-25 MED ORDER — CEPHALEXIN 250 MG PO CAPS: 250.0000 mg | ORAL_CAPSULE | Freq: Four times a day (QID) | ORAL | 0 refills | Status: AC

## 2019-08-25 MED ORDER — ONDANSETRON HCL 4 MG/2ML IJ SOLN: 4.0000 mg | Freq: Once | INTRAMUSCULAR | Status: DC

## 2019-08-25 MED ORDER — CEPHALEXIN 250 MG PO CAPS
500.0000 mg | ORAL_CAPSULE | Freq: Once | ORAL | Status: AC
  Administered 2019-08-25: 500 mg via ORAL
  Filled 2019-08-25: qty 2

## 2019-08-25 MED ORDER — ONDANSETRON HCL 4 MG PO TABS: 4.0000 mg | ORAL_TABLET | Freq: Four times a day (QID) | ORAL | 0 refills | Status: AC

## 2019-08-25 NOTE — ED Provider Notes (Signed)
[facility] EMERGENCY DEPARTMENT Provider Note   CSN: [id number] Arrival date & time: 08/24/19  1824     History Chief Complaint  Patient presents with  . Nausea  . Diarrhea    Maria [name] is a 73 y.o. female presenting for evaluation of nausea, vomiting, diarrhea, and generalized weakness.  Patient states 4 days ago she started to feel weak.  She thinks she developed a fever at that time.  Today she developed nausea, vomiting, and diarrhea.  She states she has had nothing to eat or drink today.  She has associated lower abd discomfort which is intermittent. She has not taken anything for her symptoms.  Patient states she has been having urinary frequency, however not much urine output.  She denies nasal congestion, sore throat, chest pain, cough, shortness of breath.  She denies sick contacts or contact with known COVID-19 positive person.  She denies hematemesis, melena, or hematochezia.  Additional history obtained from chart review.  Patient with a history of COPD, bipolar, hypertension, CHF, CKD.  HPI     Past Medical History:  Diagnosis Date  . Arthritis   . Bipolar disorder ([place])   . COPD (chronic obstructive pulmonary disease) ([place])   . Fracture, ankle 12/25/2017   closed right ankle fracture  . History of kidney stones   . Hypertension     Patient Active Problem List   Diagnosis Date Noted  . Acute respiratory failure with hypoxia ([place]) 10/06/2018  . Chest pain, rule out acute myocardial infarction 07/11/2018  . Anxiety state   . CHF (congestive heart failure) ([place]) 04/08/2018  . Essential hypertension 04/08/2018  . COPD (chronic obstructive pulmonary disease) ([place]) 04/08/2018  . Bipolar disorder ([place]) 04/08/2018  . CKD (chronic kidney disease) stage 3, GFR 30-59 ml/min 04/08/2018  . Tobacco dependence 04/08/2018  . Closed right ankle fracture 12/25/2017    Past Surgical History:  Procedure Laterality Date  . bladder tack    . BREAST  LUMPECTOMY    . CARPAL TUNNEL RELEASE Bilateral   . CHOLECYSTECTOMY    . LAMINECTOMY     neck  . ORIF ANKLE FRACTURE Right 12/26/2017   Procedure: OPEN REDUCTION INTERNAL FIXATION (ORIF) RIGHT BIMALLEOLAR ANKLE FRACTURE;  Surgeon: Marybelle Killings, MD;  Location: [facility];  Service: Orthopedics;  Laterality: Right;  . RADICAL HYSTERECTOMY    . spinal fusion       OB History   No obstetric history on file.     Family History  Problem Relation Age of Onset  . Hypertension Mother   . Heart failure Neg Hx   . CAD Neg Hx     Social History   Tobacco Use  . Smoking status: Current Every Day Smoker    Packs/day: 0.50    Years: 56.00    Pack years: 28.00    Types: Cigarettes  . Smokeless tobacco: Never Used  . Tobacco comment: down to 3 cigarettes/day  Substance Use Topics  . Alcohol use: No  . Drug use: No    Home Medications Prior to Admission medications   Medication Sig Start Date End Date Taking? Authorizing Provider  acetaminophen (TYLENOL) 325 MG tablet Take 2 tablets (650 mg total) by mouth every 6 (six) hours as needed for mild pain (or Fever >/= 101). 10/14/18  Yes Sheikh, Omair Latif, DO  ALPRAZolam Duanne Moron) 1 MG tablet Take 1 mg by mouth 2 (two) times daily. 08/15/19  Yes [provider]  amitriptyline (ELAVIL) 50 MG tablet  Take 50 mg by mouth at bedtime. 03/04/18  Yes [provider]  atorvastatin (LIPITOR) 20 MG tablet Take 1 tablet (20 mg total) by mouth daily at 6 PM. 10/14/18  Yes Sheikh, Omair Latif, DO  gabapentin (NEURONTIN) 100 MG capsule Take 100 mg by mouth 3 (three) times daily. 08/15/19  Yes [provider]  lisinopril (PRINIVIL,ZESTRIL) 5 MG tablet Take 1 tablet (5 mg total) by mouth daily. 10/15/18  Yes Sheikh, Omair Latif, DO  metoprolol succinate (TOPROL-XL) 25 MG 24 hr tablet Take 1 tablet (25 mg total) by mouth daily. 10/15/18  Yes Sheikh, Omair Latif, DO  omeprazole (PRILOSEC) 20 MG capsule Take 20 mg by mouth daily. 08/15/19  Yes  [provider]  oxyCODONE-acetaminophen (PERCOCET) 7.5-325 MG tablet Take 1 tablet by mouth every 8 (eight) hours as needed for moderate pain.  08/15/19  Yes [provider]  QUEtiapine (SEROQUEL) 200 MG tablet Take 1 tablet (200 mg total) by mouth 2 (two) times daily. 09/04/16  Yes Domenic Moras, PA-C  sertraline (ZOLOFT) 100 MG tablet Take 100 mg by mouth daily.   Yes [provider]  ALPRAZolam (XANAX) 0.25 MG tablet Take 1 tablet (0.25 mg total) by mouth 2 (two) times daily as needed for anxiety. Patient not taking: Reported on 08/24/2019 10/14/18   Raiford Noble Latif, DO  aspirin EC 81 MG EC tablet Take 1 tablet (81 mg total) by mouth daily. Patient not taking: Reported on 08/24/2019 10/15/18   Raiford Noble Latif, DO  cephALEXin (KEFLEX) 250 MG capsule Take 1 capsule (250 mg total) by mouth 4 (four) times daily for 7 days. 08/25/19 09/01/19  Cristin Penaflor, PA-C  ferrous sulfate 325 (65 FE) MG tablet Take 1 tablet (325 mg total) by mouth daily with breakfast. Patient not taking: Reported on 08/24/2019 10/15/18   Raiford Noble Latif, DO  furosemide (LASIX) 40 MG tablet Take 1 tablet (40 mg total) by mouth daily. Patient not taking: Reported on 08/24/2019 10/15/18   Raiford Noble Latif, DO  guaiFENesin (MUCINEX) 600 MG 12 hr tablet Take 1 tablet (600 mg total) by mouth 2 (two) times daily. Patient not taking: Reported on 08/24/2019 10/14/18   Raiford Noble Latif, DO  ipratropium-albuterol (DUONEB) 0.5-2.5 (3) MG/3ML SOLN Take 3 mLs by nebulization every 4 (four) hours as needed. Patient not taking: Reported on 08/24/2019 10/14/18   Raiford Noble Latif, DO  isosorbide mononitrate (IMDUR) 30 MG 24 hr tablet Take 0.5 tablets (15 mg total) by mouth daily. Patient not taking: Reported on 08/24/2019 10/15/18   Raiford Noble Latif, DO  levofloxacin (LEVAQUIN) 500 MG tablet Take 1 tablet (500 mg total) by mouth daily. Patient not taking: Reported on 08/24/2019 10/14/18   Raiford Noble  Latif, DO  ondansetron (ZOFRAN) 4 MG tablet Take 1 tablet (4 mg total) by mouth every 6 (six) hours. 08/25/19   Kyrie Bun, PA-C  phenol (CHLORASEPTIC) 1.4 % LIQD Use as directed 1 spray in the mouth or throat as needed for throat irritation / pain. Patient not taking: Reported on 08/24/2019 10/14/18   Raiford Noble Latif, DO  predniSONE (DELTASONE) 20 MG tablet Take 2 tablets (40 mg total) by mouth daily with breakfast. Patient not taking: Reported on 08/24/2019 10/15/18   Raiford Noble Latif, DO    Allergies    Penicillins  Review of Systems   Review of Systems  Constitutional: Positive for fever.  Gastrointestinal: Positive for abdominal pain, diarrhea, nausea and vomiting.  Genitourinary: Positive for difficulty urinating and frequency.  All other systems reviewed and are negative.   Physical Exam Updated Vital Signs BP (!) 157/83   Pulse 92   Temp 97.9 F (36.6 C) (Oral)   Resp 17   SpO2 95%   Physical Exam Vitals and nursing note reviewed.  Constitutional:      General: She is not in acute distress.    Appearance: She is well-developed.     Comments: Resting comfortably in the bed in no acute distress  HENT:     Head: Normocephalic and atraumatic.  Eyes:     Extraocular Movements: Extraocular movements intact.     Conjunctiva/sclera: Conjunctivae normal.     Pupils: Pupils are equal, round, and reactive to light.  Cardiovascular:     Rate and Rhythm: Normal rate and regular rhythm.     Pulses: Normal pulses.  Pulmonary:     Effort: Pulmonary effort is normal. No respiratory distress.     Breath sounds: Wheezing present.     Comments: Expiratory scattered wheezes. appears baseline. spo2 stable. No respiratory distress. Speaking in full sentences.  Abdominal:     General: There is no distension.     Palpations: Abdomen is soft. There is no mass.     Tenderness: There is no abdominal tenderness. There is no right CVA tenderness, left CVA tenderness, guarding or  rebound.     Comments: No ttp of the abd. Soft without rigidity, guarding, distention. Negative rebound.   Musculoskeletal:        General: Normal range of motion.     Cervical back: Normal range of motion and neck supple.     Right lower leg: No edema.     Left lower leg: No edema.  Skin:    General: Skin is warm and dry.     Capillary Refill: Capillary refill takes less than 2 seconds.  Neurological:     Mental Status: She is alert and oriented to person, place, and time.     ED Results / Procedures / Treatments   Labs (all labs ordered are listed, but only abnormal results are displayed) Labs Reviewed  LIPASE, BLOOD - Abnormal; Notable for the following components:      Result Value   Lipase 77 (*)    All other components within normal limits  COMPREHENSIVE METABOLIC PANEL - Abnormal; Notable for the following components:   Calcium 8.6 (*)    Total Protein 6.1 (*)    Albumin 3.4 (*)    All other components within normal limits  CBC - Abnormal; Notable for the following components:   Hemoglobin 9.8 (*)    HCT 34.1 (*)    MCH 23.0 (*)    MCHC 28.7 (*)    RDW 16.3 (*)    All other components within normal limits  URINALYSIS, ROUTINE W REFLEX MICROSCOPIC - Abnormal; Notable for the following components:   Specific Gravity, Urine 1.033 (*)    Hgb urine dipstick SMALL (*)    Nitrite POSITIVE (*)    Leukocytes,Ua TRACE (*)    Bacteria, UA MANY (*)    All other components within normal limits  URINE CULTURE    EKG None  Radiology CT ABDOMEN PELVIS W CONTRAST  Result Date: 08/25/2019 CLINICAL DATA:  Acute abdominal pain. Fever. Nausea and vomiting. EXAM: CT ABDOMEN AND PELVIS WITH CONTRAST TECHNIQUE: Multidetector CT imaging of the abdomen and pelvis was performed using the standard protocol following bolus administration of intravenous contrast. CONTRAST:  140mL OMNIPAQUE IOHEXOL 300 MG/ML SOLN, <See Chart>  OMNIPAQUE IOHEXOL 350 MG/ML SOLN COMPARISON:  None. FINDINGS:  Lower chest: Calcified granuloma in the right lower lobe. Minimal hypoventilatory atelectasis in the lung bases. No pleural fluid or consolidation. Hepatobiliary: Scattered calcified granuloma. No focal lesion. Postcholecystectomy. Prominent common bile duct measuring 14 mm in the midportion. No visualized choledocholithiasis. Mild central intrahepatic biliary ductal dilatation. Pancreas: Minimal fatty atrophy. No ductal dilatation or inflammation. Spleen: Calcified granuloma. Normal in size. Adrenals/Urinary Tract: Normal adrenal glands. Mild right renal atrophy with multifocal cortical scarring. Minimal cortical scarring of the lower left kidney. Early excretion of IV contrast in the renal collecting system limits evaluation for renal stones. No perinephric edema. No hydronephrosis. Urinary bladder is partially distended. There is bladder wall thickening and perivesicular edema/stranding. Stomach/Bowel: Stomach is nondistended. No gastric wall thickening. No small bowel obstruction or inflammation. Appendix not confidently visualized, no pericecal inflammation or evidence of appendicitis. Small volume liquid stool in the proximal colon. Formed stool in the more distal colon. No colonic wall thickening or inflammatory change. No significant diverticular disease. Vascular/Lymphatic: Aorto bi-iliac atherosclerosis. No aneurysm. The portal vein is patent. Mesenteric vessels appear patent. No enlarged lymph nodes in the abdomen or pelvis. Reproductive: Status post hysterectomy. No adnexal masses. Other: Small amount of simple free fluid in the pelvis which may be reactive. No upper abdominal ascites. No free air. Musculoskeletal: Posterior lumbar fusion. Bladder tack. Spinal stimulator in place. IMPRESSION: 1. Bladder wall thickening and perivesicular edema, can be seen with cystitis. 2. Postcholecystectomy with prominent common bile duct and mild central intrahepatic biliary ductal dilatation. This may be related to  prior cholecystectomy, recommend correlation with LFTs. If LFTs are elevated, recommend further evaluation with MRCP/ERCP. If LFTs are normal, no further evaluation is needed. 3. Bilateral renal scarring, right greater than left with mild right renal atrophy. 4. Small amount of simple free fluid in the pelvis is likely reactive but nonspecific. Aortic Atherosclerosis (ICD10-I70.0). Electronically Signed   By: Keith Rake M.D.   On: 08/25/2019 00:17    Procedures Procedures (including critical care time)  Medications Ordered in ED Medications  iohexol (OMNIPAQUE) 350 MG/ML injection 100 mL ( Intravenous Canceled Entry 08/24/19 2342)  cephALEXin (KEFLEX) capsule 500 mg (has no administration in time range)  iohexol (OMNIPAQUE) 300 MG/ML solution 100 mL (100 mLs Intravenous Contrast Given 08/24/19 2341)    ED Course  I have reviewed the triage vital signs and the nursing notes.  Pertinent labs & imaging results that were available during my care of the patient were reviewed by me and considered in my medical decision making (see chart for details).    MDM Rules/Calculators/A&P                       Patient presenting for evaluation nausea, vomiting, diarrhea, and intermittent abdominal pain.  Physical exam shows patient who appears nontoxic.  She is afebrile not tachycardic.  Blood pressure is stable.  Labs obtained from triage show slightly elevated lipase at 77.  Otherwise reassuring.  No leukocytosis, electrolytes stable.  As patient is having abdominal symptoms, will obtain CT abdomen pelvis to rule out pancreatitis, colitis, or other intra-abdominal infection.  CT consistent with cystitis, otherwise reassuring.  Urine positive for UTI with positive nitrates, leuks, many bacteria.  She is tolerating p.o. without difficulty.  Will give first dose of Keflex in the ED.  Patient has a penicillin allergy in her chart, she states this was from when she was a child.  She had Augmentin  as an  adult without reaction.  Discussed close follow-up with her primary care doctor for symptoms not improving.  At this time, patient appears safe for discharge.  Return precautions given.  Patient states she understands and agrees to plan.  Final Clinical Impression(s) / ED Diagnoses Final diagnoses:  Acute cystitis without hematuria  Nausea vomiting and diarrhea    Rx / DC Orders ED Discharge Orders         Ordered    cephALEXin (KEFLEX) 250 MG capsule  4 times daily     08/25/19 0028    ondansetron (ZOFRAN) 4 MG tablet  Every 6 hours     08/25/19 0028           Franchot Heidelberg, PA-C 08/25/19 [id number]    Tegeler, Gwenyth Allegra, MD 08/25/19 1106

## 2019-08-25 NOTE — ED Notes (Signed)
Pt alert no pain at present

## 2019-08-25 NOTE — Discharge Instructions (Addendum)
Take antibiotics as prescribed.  Take entire course, even if your symptoms improve. Use Zofran as needed for nausea or vomiting. Make sure you stay well-hydrated water. Follow-up with your primary care doctor if symptoms not improving. Return to emergency room if you develop persistent vomiting despite medication, severe worsening abdominal pain, or any new, worsening, or concerning symptoms.

## 2019-08-27 LAB — URINE CULTURE: Culture: >=100000 — AB

## 2019-08-28 ENCOUNTER — Telehealth: Payer: Self-pay

## 2019-08-28 NOTE — Telephone Encounter (Signed)
Post ED Visit - Positive Culture Follow-up  Culture report reviewed by antimicrobial stewardship pharmacist: [place] Team []  Elenor Quinones, Pharm.D. []  Heide Guile, Pharm.D., BCPS AQ-ID []  Parks Neptune, Pharm.D., BCPS []  Alycia Rossetti, Pharm.D., BCPS []  [place], [place].D., BCPS, AAHIVP []  Legrand Como, Pharm.D., BCPS, AAHIVP []  Salome Arnt, PharmD, BCPS []  Johnnette Gourd, PharmD, BCPS []  Hughes Better, PharmD, BCPS []  Leeroy Cha, PharmD []  Laqueta Linden, PharmD, BCPS []  Albertina Parr, PharmD [place] Team []  Leodis Sias, PharmD []  Lindell Spar, PharmD []  Royetta Asal, PharmD []  Graylin Shiver, Rph []  Rema Fendt) Glennon Mac, PharmD []  Arlyn Dunning, PharmD []  Netta Cedars, PharmD []  Dia Sitter, PharmD []  Leone Haven, PharmD []  Gretta Arab, PharmD []  Theodis Shove, PharmD []  Peggyann Juba, PharmD []  Reuel Boom, PharmD   Positive urine culture Treated with Cephalexin, organism sensitive to the same and no further patient follow-up is required at this time.  Genia Del 08/28/2019, 9:51 AM

## 2019-12-04 ENCOUNTER — Emergency Department (HOSPITAL_COMMUNITY)
Admission: EM | Admit: 2019-12-04 | Discharge: 2019-12-05 | Disposition: A | Discharge status: Home / Self Care | Payer: Medicare HMO | Attending: Emergency Medicine | Admitting: Emergency Medicine

## 2019-12-04 ENCOUNTER — Encounter (HOSPITAL_COMMUNITY): Payer: Self-pay

## 2019-12-04 ENCOUNTER — Other Ambulatory Visit: Payer: Self-pay

## 2019-12-04 ENCOUNTER — Emergency Department (HOSPITAL_COMMUNITY): Payer: Medicare HMO

## 2019-12-04 DIAGNOSIS — F419 Anxiety disorder, unspecified: Secondary | ICD-10-CM | POA: Insufficient documentation

## 2019-12-04 DIAGNOSIS — R072 Precordial pain: Secondary | ICD-10-CM

## 2019-12-04 DIAGNOSIS — N183 Chronic kidney disease, stage 3 unspecified: Secondary | ICD-10-CM | POA: Insufficient documentation

## 2019-12-04 DIAGNOSIS — F17210 Nicotine dependence, cigarettes, uncomplicated: Secondary | ICD-10-CM | POA: Diagnosis not present

## 2019-12-04 DIAGNOSIS — I509 Heart failure, unspecified: Secondary | ICD-10-CM | POA: Insufficient documentation

## 2019-12-04 DIAGNOSIS — Z79899 Other long term (current) drug therapy: Secondary | ICD-10-CM | POA: Diagnosis not present

## 2019-12-04 DIAGNOSIS — I130 Hypertensive heart and chronic kidney disease with heart failure and stage 1 through stage 4 chronic kidney disease, or unspecified chronic kidney disease: Secondary | ICD-10-CM | POA: Insufficient documentation

## 2019-12-04 DIAGNOSIS — J449 Chronic obstructive pulmonary disease, unspecified: Secondary | ICD-10-CM | POA: Diagnosis not present

## 2019-12-04 DIAGNOSIS — R079 Chest pain, unspecified: Secondary | ICD-10-CM | POA: Diagnosis present

## 2019-12-04 LAB — CBC
HCT: 30.6 % — ABNORMAL LOW (ref 36.0–46.0)
Hemoglobin: 8.1 g/dL — ABNORMAL LOW (ref 12.0–15.0)
MCH: 20.4 pg — ABNORMAL LOW (ref 26.0–34.0)
MCHC: 26.5 g/dL — ABNORMAL LOW (ref 30.0–36.0)
MCV: 77.1 fL — ABNORMAL LOW (ref 80.0–100.0)
Platelets: 378 10*3/uL (ref 150–400)
RBC: 3.97 MIL/uL (ref 3.87–5.11)
RDW: 19.3 % — ABNORMAL HIGH (ref 11.5–15.5)
WBC: 9.2 10*3/uL (ref 4.0–10.5)
nRBC: 0.2 % (ref 0.0–0.2)

## 2019-12-04 LAB — BASIC METABOLIC PANEL
Anion gap: 8 (ref 5–15)
BUN: 14 mg/dL (ref 8–23)
CO2: 26 mmol/L (ref 22–32)
Calcium: 8.5 mg/dL — ABNORMAL LOW (ref 8.9–10.3)
Chloride: 110 mmol/L (ref 98–111)
Creatinine, Ser: 0.74 mg/dL (ref 0.44–1.00)
GFR calc Af Amer: >60 mL/min (ref >60)
GFR calc non Af Amer: >60 mL/min (ref >60)
Glucose, Bld: 119 mg/dL — ABNORMAL HIGH (ref 70–99)
Potassium: 4.2 mmol/L (ref 3.5–5.1)
Sodium: 144 mmol/L (ref 135–145)

## 2019-12-04 LAB — TROPONIN I (HIGH SENSITIVITY)
Troponin I (High Sensitivity): 19 ng/L — ABNORMAL HIGH (ref <18)
Troponin I (High Sensitivity): 17 ng/L (ref <18)

## 2019-12-04 MED ORDER — SODIUM CHLORIDE 0.9% FLUSH: 3.0000 mL | Freq: Once | INTRAVENOUS | Status: DC

## 2019-12-04 NOTE — ED Triage Notes (Signed)
Pt bib [facility] from home w/ c/o L sided chest pain and sob w/ exertion x 3 weeks. Pt denies pain at this time. Pt denies any cardiac hx. EMS VSS.  HR 108 BP 114/90 CBG 146 SPO2 96% on RA

## 2019-12-05 ENCOUNTER — Telehealth: Payer: Self-pay | Admitting: *Deleted

## 2019-12-05 MED ORDER — LORAZEPAM 1 MG PO TABS: ORAL_TABLET | ORAL | 0 refills | Status: AC

## 2019-12-05 MED ORDER — LORAZEPAM 1 MG PO TABS
2.0000 mg | ORAL_TABLET | Freq: Once | ORAL | Status: AC
  Administered 2019-12-05: 2 mg via ORAL
  Filled 2019-12-05: qty 2

## 2019-12-05 MED ORDER — ALBUTEROL SULFATE HFA 108 (90 BASE) MCG/ACT IN AERS
2.0000 | INHALATION_SPRAY | Freq: Once | RESPIRATORY_TRACT | Status: AC
  Administered 2019-12-05: 2 via RESPIRATORY_TRACT
  Filled 2019-12-05: qty 6.7

## 2019-12-05 MED ORDER — LORAZEPAM 1 MG PO TABS
1.0000 mg | ORAL_TABLET | Freq: Once | ORAL | Status: AC
  Administered 2019-12-05: 1 mg via ORAL
  Filled 2019-12-05: qty 1

## 2019-12-05 NOTE — ED Provider Notes (Signed)
[facility] EMERGENCY DEPARTMENT Provider Note   CSN: [id number] Arrival date & time: 12/04/19  1907     History Chief Complaint  Patient presents with  . Chest Pain    Maria [name] is a 74 y.o. female.  The history is provided by the patient.  Chest Pain Pain location:  L chest Pain radiates to:  Does not radiate Pain severity:  Moderate Onset quality:  Gradual Progression:  Resolved Chronicity:  New Context comment:  Xanax withdrawal Relieved by:  Nothing Exacerbated by: not taking xanax. Patient presents with chest pain that is caused by not taking her Xanax.  Patient reports that she takes Xanax 1 mg 4 times daily.  She has been on this med for over a year and she is concerned that she is addicted.  She reports that whenever she stops taking the Xanax she becomes anxious and left side of her chest hurts and she becomes short of breath. She will have nausea and vomiting with these episodes.  She reports a chronic cough and shortness of breath due to her COPD. She denies any known history of CAD    Past Medical History:  Diagnosis Date  . Arthritis   . Bipolar disorder ([place])   . COPD (chronic obstructive pulmonary disease) ([place])   . Fracture, ankle 12/25/2017   closed right ankle fracture  . History of kidney stones   . Hypertension     Patient Active Problem List   Diagnosis Date Noted  . Acute respiratory failure with hypoxia ([place]) 10/06/2018  . Chest pain, rule out acute myocardial infarction 07/11/2018  . Anxiety state   . CHF (congestive heart failure) ([place]) 04/08/2018  . Essential hypertension 04/08/2018  . COPD (chronic obstructive pulmonary disease) ([place]) 04/08/2018  . Bipolar disorder ([place]) 04/08/2018  . CKD (chronic kidney disease) stage 3, GFR 30-59 ml/min 04/08/2018  . Tobacco dependence 04/08/2018  . Closed right ankle fracture 12/25/2017    Past Surgical History:  Procedure Laterality Date  . bladder tack    . BREAST  LUMPECTOMY    . CARPAL TUNNEL RELEASE Bilateral   . CHOLECYSTECTOMY    . LAMINECTOMY     neck  . ORIF ANKLE FRACTURE Right 12/26/2017   Procedure: OPEN REDUCTION INTERNAL FIXATION (ORIF) RIGHT BIMALLEOLAR ANKLE FRACTURE;  Surgeon: Marybelle Killings, MD;  Location: [place];  Service: Orthopedics;  Laterality: Right;  . RADICAL HYSTERECTOMY    . spinal fusion       OB History   No obstetric history on file.     Family History  Problem Relation Age of Onset  . Hypertension Mother   . Heart failure Neg Hx   . CAD Neg Hx     Social History   Tobacco Use  . Smoking status: Current Every Day Smoker    Packs/day: 0.50    Years: 56.00    Pack years: 28.00    Types: Cigarettes  . Smokeless tobacco: Never Used  . Tobacco comment: down to 3 cigarettes/day  Substance Use Topics  . Alcohol use: No  . Drug use: No    Home Medications Prior to Admission medications   Medication Sig Start Date End Date Taking? Authorizing Provider  acetaminophen (TYLENOL) 325 MG tablet Take 2 tablets (650 mg total) by mouth every 6 (six) hours as needed for mild pain (or Fever >/= 101). 10/14/18   Sheikh, Omair Latif, DO  ALPRAZolam Duanne Moron) 0.25 MG tablet Take 1 tablet (0.25 mg total) by mouth  2 (two) times daily as needed for anxiety. Patient not taking: Reported on 08/24/2019 10/14/18   Raiford Noble Latif, DO  ALPRAZolam Duanne Moron) 1 MG tablet Take 1 mg by mouth 2 (two) times daily. 08/15/19   [provider]  amitriptyline (ELAVIL) 50 MG tablet Take 50 mg by mouth at bedtime. 03/04/18   [provider]  aspirin EC 81 MG EC tablet Take 1 tablet (81 mg total) by mouth daily. Patient not taking: Reported on 08/24/2019 10/15/18   Raiford Noble Latif, DO  atorvastatin (LIPITOR) 20 MG tablet Take 1 tablet (20 mg total) by mouth daily at 6 PM. 10/14/18   Kerney Elbe, DO  ferrous sulfate 325 (65 FE) MG tablet Take 1 tablet (325 mg total) by mouth daily with breakfast. Patient not taking: Reported  on 08/24/2019 10/15/18   Raiford Noble Latif, DO  furosemide (LASIX) 40 MG tablet Take 1 tablet (40 mg total) by mouth daily. Patient not taking: Reported on 08/24/2019 10/15/18   Raiford Noble Latif, DO  gabapentin (NEURONTIN) 100 MG capsule Take 100 mg by mouth 3 (three) times daily. 08/15/19   [provider]  guaiFENesin (MUCINEX) 600 MG 12 hr tablet Take 1 tablet (600 mg total) by mouth 2 (two) times daily. Patient not taking: Reported on 08/24/2019 10/14/18   Raiford Noble Latif, DO  ipratropium-albuterol (DUONEB) 0.5-2.5 (3) MG/3ML SOLN Take 3 mLs by nebulization every 4 (four) hours as needed. Patient not taking: Reported on 08/24/2019 10/14/18   Raiford Noble Latif, DO  isosorbide mononitrate (IMDUR) 30 MG 24 hr tablet Take 0.5 tablets (15 mg total) by mouth daily. Patient not taking: Reported on 08/24/2019 10/15/18   Raiford Noble Latif, DO  levofloxacin (LEVAQUIN) 500 MG tablet Take 1 tablet (500 mg total) by mouth daily. Patient not taking: Reported on 08/24/2019 10/14/18   Raiford Noble Latif, DO  lisinopril (PRINIVIL,ZESTRIL) 5 MG tablet Take 1 tablet (5 mg total) by mouth daily. 10/15/18   Raiford Noble Latif, DO  metoprolol succinate (TOPROL-XL) 25 MG 24 hr tablet Take 1 tablet (25 mg total) by mouth daily. 10/15/18   Raiford Noble Latif, DO  omeprazole (PRILOSEC) 20 MG capsule Take 20 mg by mouth daily. 08/15/19   [provider]  ondansetron (ZOFRAN) 4 MG tablet Take 1 tablet (4 mg total) by mouth every 6 (six) hours. 08/25/19   Caccavale, Sophia, PA-C  oxyCODONE-acetaminophen (PERCOCET) 7.5-325 MG tablet Take 1 tablet by mouth every 8 (eight) hours as needed for moderate pain.  08/15/19   [provider]  phenol (CHLORASEPTIC) 1.4 % LIQD Use as directed 1 spray in the mouth or throat as needed for throat irritation / pain. Patient not taking: Reported on 08/24/2019 10/14/18   Raiford Noble Latif, DO  predniSONE (DELTASONE) 20 MG tablet Take 2 tablets (40 mg total) by mouth  daily with breakfast. Patient not taking: Reported on 08/24/2019 10/15/18   Raiford Noble Latif, DO  QUEtiapine (SEROQUEL) 200 MG tablet Take 1 tablet (200 mg total) by mouth 2 (two) times daily. 09/04/16   Domenic Moras, PA-C  sertraline (ZOLOFT) 100 MG tablet Take 100 mg by mouth daily.    [provider]    Allergies    Penicillins  Review of Systems   Review of Systems  Cardiovascular: Positive for chest pain.  Gastrointestinal: Negative for blood in stool.  Psychiatric/Behavioral: Negative for suicidal ideas. The patient is nervous/anxious.   All other systems reviewed and are negative.   Physical Exam Updated Vital  Signs BP (!) 143/90   Pulse 85   Temp 98.2 F (36.8 C) (Oral)   Resp 20   Ht 1.702 m (5\' 7" )   Wt 77.1 kg   SpO2 92%   BMI 26.63 kg/m   Physical Exam CONSTITUTIONAL: Elderly, anxious HEAD: Normocephalic/atraumatic EYES: EOMI/PERRL ENMT: Mucous membranes moist NECK: supple no meningeal signs SPINE/BACK:entire spine nontender CV: S1/S2 noted, no murmurs/rubs/gallops noted LUNGS: Wheezing bilaterally, tachypnea ABDOMEN: soft, nontender, no rebound or guarding, bowel sounds noted throughout abdomen GU:no cva tenderness NEURO: Pt is awake/alert/appropriate, moves all extremitiesx4.  No facial droop.   EXTREMITIES: pulses normal/equal, full ROM SKIN: warm, color normal PSYCH: Anxious  ED Results / Procedures / Treatments   Labs (all labs ordered are listed, but only abnormal results are displayed) Labs Reviewed  BASIC METABOLIC PANEL - Abnormal; Notable for the following components:      Result Value   Glucose, Bld 119 (*)    Calcium 8.5 (*)    All other components within normal limits  CBC - Abnormal; Notable for the following components:   Hemoglobin 8.1 (*)    HCT 30.6 (*)    MCV 77.1 (*)    MCH 20.4 (*)    MCHC 26.5 (*)    RDW 19.3 (*)    All other components within normal limits  TROPONIN I (HIGH SENSITIVITY) - Abnormal; Notable for  the following components:   Troponin I (High Sensitivity) 19 (*)    All other components within normal limits  TROPONIN I (HIGH SENSITIVITY)    EKG EKG Interpretation  Date/Time:  Thursday December 04 2019 19:20:02 EDT Ventricular Rate:  102 PR Interval:  134 QRS Duration: 152 QT Interval:  404 QTC Calculation: 526 R Axis:   81 Text Interpretation: Sinus tachycardia Left bundle branch block Abnormal ECG Confirmed by Ripley Fraise [id number]) on 12/05/2019 1:36:26 AM   Radiology DG Chest 2 View  Result Date: 12/04/2019 CLINICAL DATA:  LEFT side chest pain, shortness of breath with exertion for 3 weeks EXAM: CHEST - 2 VIEW COMPARISON:  10/06/2018 FINDINGS: Intraspinal stimulator noted. Borderline enlargement of cardiac silhouette. Mediastinal contours and pulmonary vascularity normal. Atherosclerotic calcification aorta. Bronchitic changes with small RIGHT pleural effusion and mild RIGHT basilar atelectasis. Remaining lungs clear. No pneumothorax. Bones demineralized. IMPRESSION: Bronchitic changes with RIGHT basilar atelectasis and small RIGHT pleural effusion. Electronically Signed   By: Lavonia Dana M.D.   On: 12/04/2019 20:20    Procedures Procedures   Medications Ordered in ED Medications  sodium chloride flush (NS) 0.9 % injection 3 mL (has no administration in time range)  albuterol (VENTOLIN HFA) 108 (90 Base) MCG/ACT inhaler 2 puff (2 puffs Inhalation Given 12/05/19 0328)  LORazepam (ATIVAN) tablet 2 mg (2 mg Oral Given 12/05/19 0327)    ED Course  I have reviewed the triage vital signs and the nursing notes.  Pertinent labs & imaging results that were available during my care of the patient were reviewed by me and considered in my medical decision making (see chart for details).    MDM Rules/Calculators/A&P                     3:52 AM Patient presents because she think she is addicted to Xanax and whenever she stops she has chest pain and hyperventilation. She is wheezing  bilaterally, will give her albuterol.  We will also give her a dose of Ativan and reassess.  Patient otherwise stable at this time. No active chest pain  at this time Patient has mildly worsening of her anemia.  Denies any recent blood loss or blood in her stool 5:45 AM Overall patient appears improved.  She is drinking coffee and watching television.  She reports her breathing is improved.  She does have brief drops of oxygen to the low 90s but likely chronic due to her COPD and smoking She is interested in trying an Ativan taper as an outpatient to help her stop Xanax.  She understands to stop taking the Xanax, and will do the Ativan taper at home. She will otherwise awake alert, no acute distress.  Denies SI  She only had chest pain and shortness of breath/hyperventilation when she tries to stop Xanax.  Low suspicion for ACS/PE/dissection at this time Final Clinical Impression(s) / ED Diagnoses Final diagnoses:  Precordial pain  Chronic obstructive pulmonary disease, unspecified COPD type ([place])  Anxiety    Rx / DC Orders ED Discharge Orders         Ordered    LORazepam (ATIVAN) 1 MG tablet     12/05/19 0544           Ripley Fraise, MD 12/05/19 [phone]

## 2019-12-05 NOTE — Telephone Encounter (Signed)
Pharmacy called regarding pt Rx for Ativan as pt has fill another Rx for Ativan at another location.

## 2019-12-05 NOTE — Telephone Encounter (Signed)
Pharmacy called related to Rx: Ativan .Marland KitchenMarland KitchenEDCM clarified with EDP ([facility]) to dispense as writte.

## 2020-01-10 DEATH — deceased
